# Patient Record
Sex: Female | Born: 1990 | Race: White | Hispanic: No | Marital: Married | Smoking: Never smoker
Health system: Southern US, Community
[De-identification: ages and names within clinical notes are randomized; demographics above are authoritative.]

## PROBLEM LIST (undated history)

## (undated) ENCOUNTER — Inpatient Hospital Stay (HOSPITAL_COMMUNITY): Payer: Self-pay

## (undated) ENCOUNTER — Inpatient Hospital Stay: Payer: Self-pay

## (undated) DIAGNOSIS — N189 Chronic kidney disease, unspecified: Secondary | ICD-10-CM

## (undated) DIAGNOSIS — F419 Anxiety disorder, unspecified: Secondary | ICD-10-CM

## (undated) DIAGNOSIS — N200 Calculus of kidney: Secondary | ICD-10-CM

## (undated) DIAGNOSIS — F329 Major depressive disorder, single episode, unspecified: Secondary | ICD-10-CM

## (undated) DIAGNOSIS — N390 Urinary tract infection, site not specified: Secondary | ICD-10-CM

## (undated) DIAGNOSIS — Z8659 Personal history of other mental and behavioral disorders: Secondary | ICD-10-CM

## (undated) DIAGNOSIS — F32A Depression, unspecified: Secondary | ICD-10-CM

## (undated) HISTORY — PX: OTHER SURGICAL HISTORY: SHX169

## (undated) HISTORY — PX: APPENDECTOMY: SHX54

## (undated) HISTORY — PX: COLONOSCOPY: SHX174

---
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
## 2010-08-06 ENCOUNTER — Emergency Department (HOSPITAL_COMMUNITY)
Admission: EM | Admit: 2010-08-06 | Discharge: 2010-08-06 | Disposition: A | Discharge status: Home / Self Care | Payer: BC Managed Care – PPO | Attending: Emergency Medicine | Admitting: Emergency Medicine

## 2010-08-06 DIAGNOSIS — R109 Unspecified abdominal pain: Secondary | ICD-10-CM | POA: Insufficient documentation

## 2010-08-06 DIAGNOSIS — N949 Unspecified condition associated with female genital organs and menstrual cycle: Secondary | ICD-10-CM | POA: Insufficient documentation

## 2010-08-06 DIAGNOSIS — B3731 Acute candidiasis of vulva and vagina: Secondary | ICD-10-CM | POA: Insufficient documentation

## 2010-08-06 DIAGNOSIS — N760 Acute vaginitis: Secondary | ICD-10-CM | POA: Insufficient documentation

## 2010-08-06 DIAGNOSIS — R300 Dysuria: Secondary | ICD-10-CM | POA: Insufficient documentation

## 2010-08-06 DIAGNOSIS — N390 Urinary tract infection, site not specified: Secondary | ICD-10-CM | POA: Insufficient documentation

## 2010-08-06 DIAGNOSIS — B373 Candidiasis of vulva and vagina: Secondary | ICD-10-CM | POA: Insufficient documentation

## 2010-08-06 DIAGNOSIS — F909 Attention-deficit hyperactivity disorder, unspecified type: Secondary | ICD-10-CM | POA: Insufficient documentation

## 2010-08-06 LAB — URINALYSIS, ROUTINE W REFLEX MICROSCOPIC
Nitrite: NEGATIVE
Specific Gravity, Urine: 1.019 (ref 1.005–1.030)
Urobilinogen, UA: 1 mg/dL (ref 0.0–1.0)

## 2010-08-06 LAB — URINE MICROSCOPIC-ADD ON

## 2010-08-06 LAB — WET PREP, GENITAL

## 2010-08-06 LAB — POCT PREGNANCY, URINE: Preg Test, Ur: NEGATIVE

## 2010-08-06 LAB — GC/CHLAMYDIA PROBE AMP, GENITAL
Chlamydia, DNA Probe: NEGATIVE
GC Probe Amp, Genital: NEGATIVE

## 2010-08-08 LAB — URINE CULTURE

## 2011-10-19 ENCOUNTER — Inpatient Hospital Stay: Payer: Self-pay | Admitting: Internal Medicine

## 2011-10-19 LAB — URINALYSIS, COMPLETE
Bacteria: NONE SEEN
Bilirubin,UR: NEGATIVE
Blood: NEGATIVE
Leukocyte Esterase: NEGATIVE
Ph: 6 (ref 4.5–8.0)
RBC,UR: 3 /HPF (ref 0–5)
Specific Gravity: 1.025 (ref 1.003–1.030)

## 2011-10-19 LAB — CBC WITH DIFFERENTIAL/PLATELET
Basophil #: 0.2 10*3/uL — ABNORMAL HIGH (ref 0.0–0.1)
Basophil %: 0.4 %
Eosinophil #: 0 10*3/uL (ref 0.0–0.7)
HGB: 12 g/dL (ref 12.0–16.0)
HGB: 12.9 g/dL (ref 12.0–16.0)
Lymphocyte %: 10.2 %
Lymphocyte %: 8.8 %
MCHC: 33.3 g/dL (ref 32.0–36.0)
Monocyte #: 0.9 x10 3/mm (ref 0.2–0.9)
Monocyte %: 7.9 %
Neutrophil #: 10.2 10*3/uL — ABNORMAL HIGH (ref 1.4–6.5)
Neutrophil %: 80.5 %
Neutrophil %: 82.9 %
Platelet: 227 10*3/uL (ref 150–440)
RDW: 12.9 % (ref 11.5–14.5)
WBC: 12.3 10*3/uL — ABNORMAL HIGH (ref 3.6–11.0)

## 2011-10-19 LAB — COMPREHENSIVE METABOLIC PANEL
Alkaline Phosphatase: 72 U/L (ref 50–136)
BUN: 9 mg/dL (ref 7–18)
Chloride: 102 mmol/L (ref 98–107)
Creatinine: 0.63 mg/dL (ref 0.60–1.30)
EGFR (Non-African Amer.): >60
Glucose: 98 mg/dL (ref 65–99)

## 2011-10-19 LAB — CSF CC+PROT+GLU+CULT PANEL
Glucose, CSF: 70 mg/dL (ref 40–75)
Lymphocytes: 26 %
Other Cells: 0 %
WBC (CSF): 15 /mm3

## 2011-10-19 LAB — PREGNANCY, URINE: Pregnancy Test, Urine: NEGATIVE m[IU]/mL

## 2011-10-19 IMAGING — CT CT HEAD WITHOUT CONTRAST
2 series · 16 of 30 positions shown, 20 images · non-contrast
Comparison: none

REASON FOR EXAM: headache, stiff neck and elevated WBC
COMMENTS:   LMP: Three weeks ago

[Series 2: without · axial · non-contrast · 0.45mm/px · z∈[+269,+404]mm · 13 of 33 slices shown, 17 images]
[im 3/33  brain]
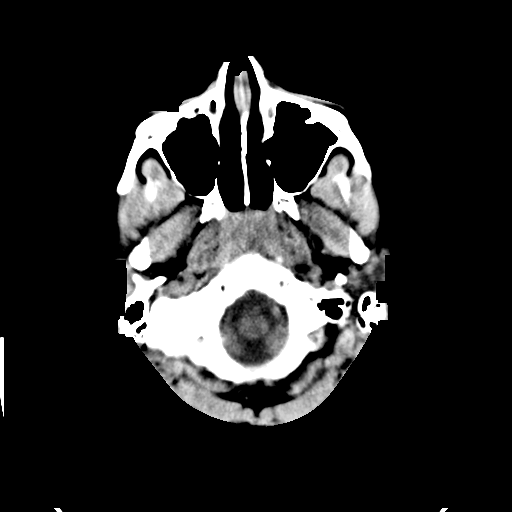
[im 3/33  bone]
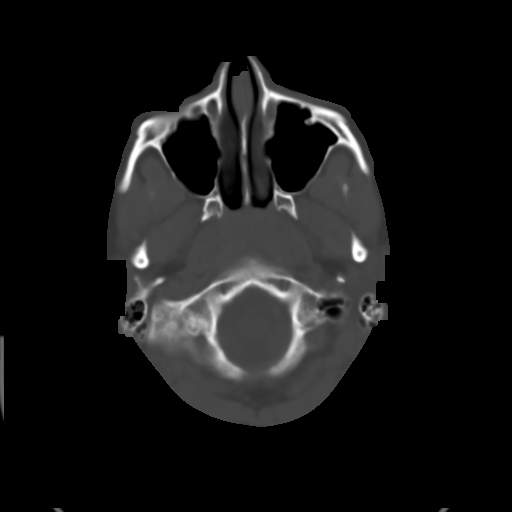
[im 5/33  brain]
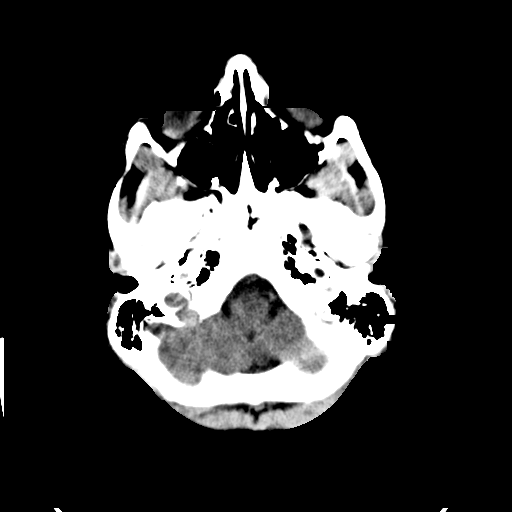
[im 7/33  brain]
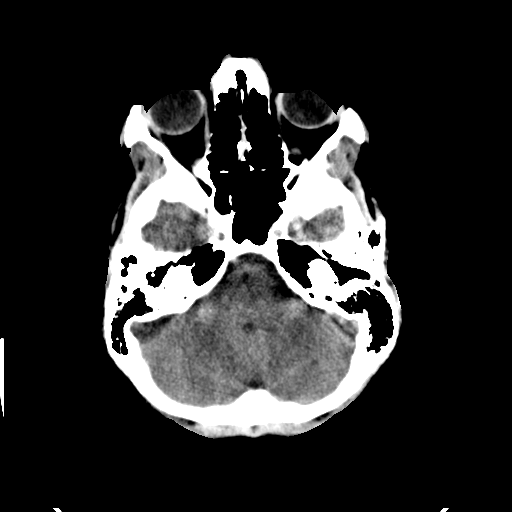
[im 10/33  brain]
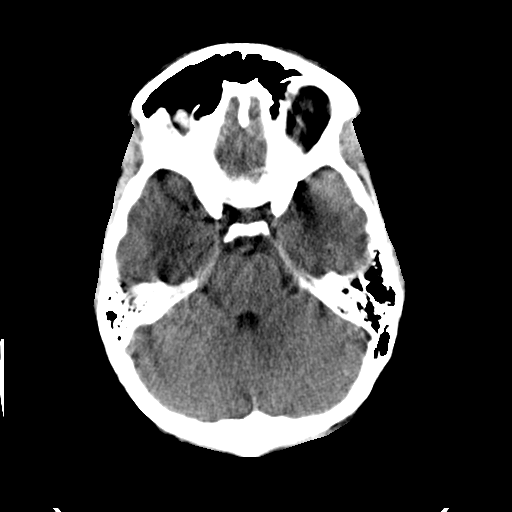
[im 12/33  brain]
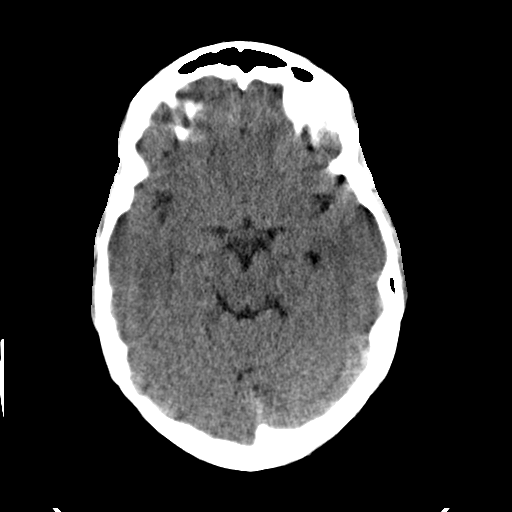
[im 12/33  bone]
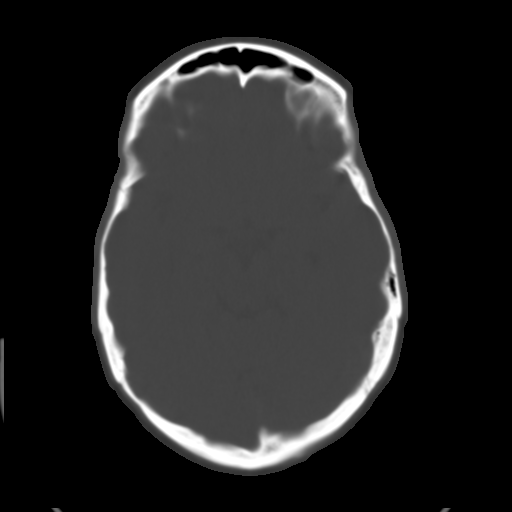
[im 14/33  brain]
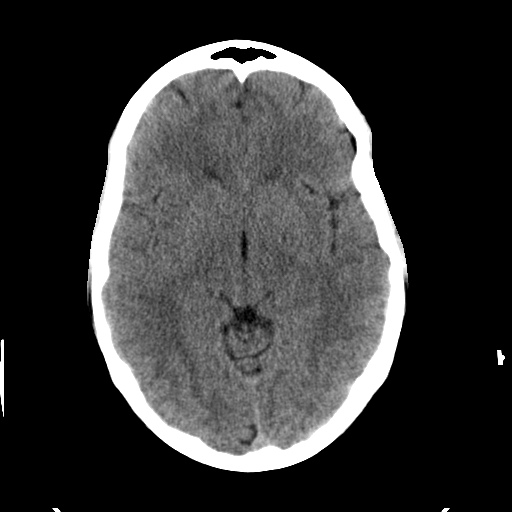
[im 17/33  brain]
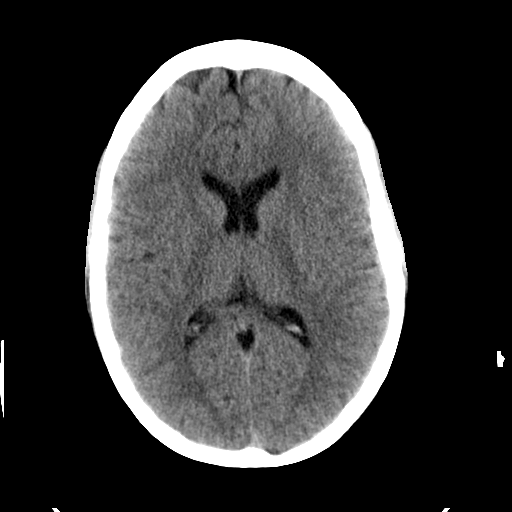
[im 19/33  brain]
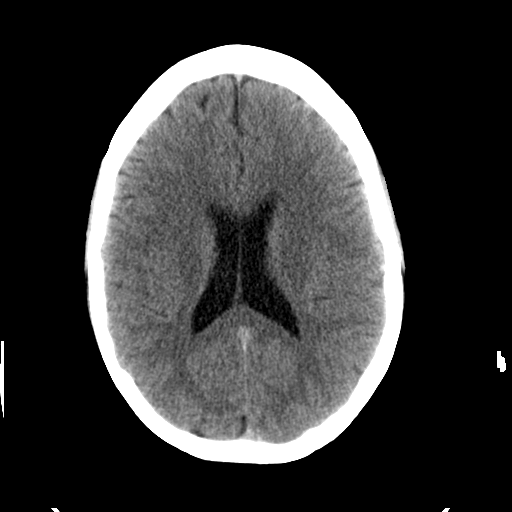
[im 21/33  brain]
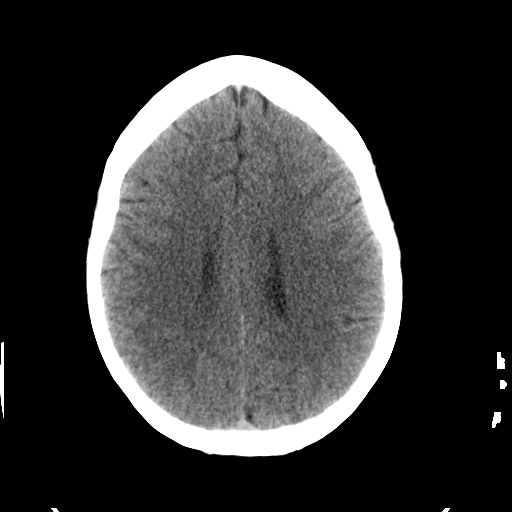
[im 21/33  bone]
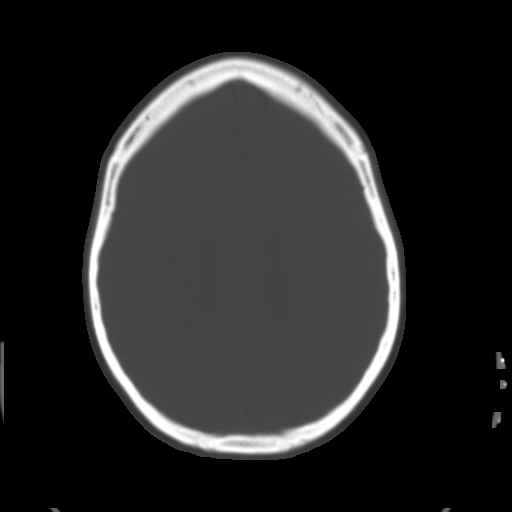
[im 23/33  brain]
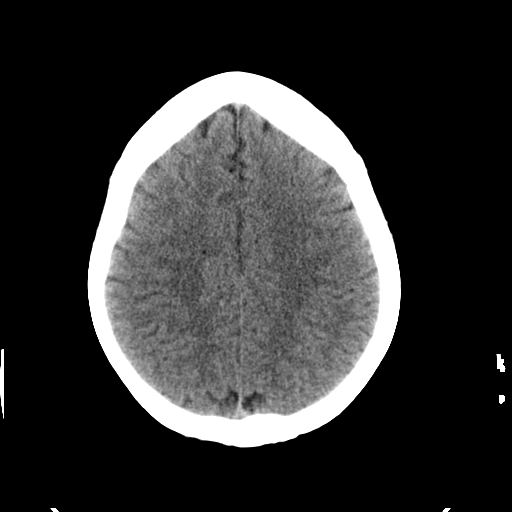
[im 26/33  brain]
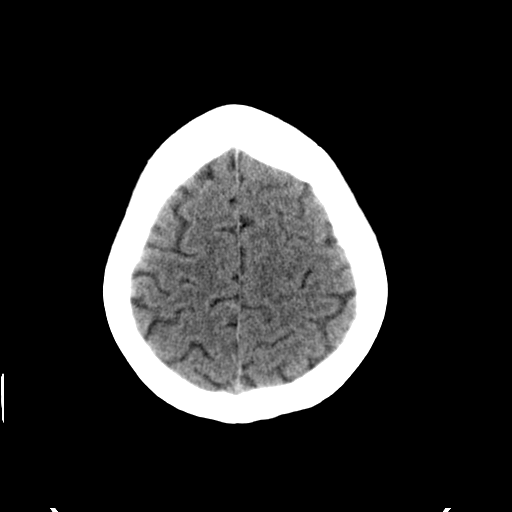
[im 28/33  brain]
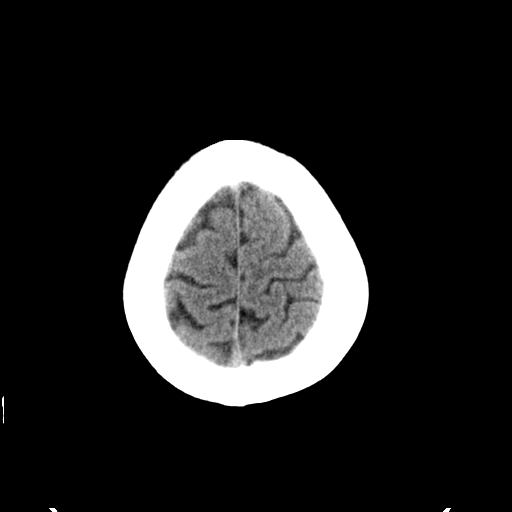
[im 30/33  brain]
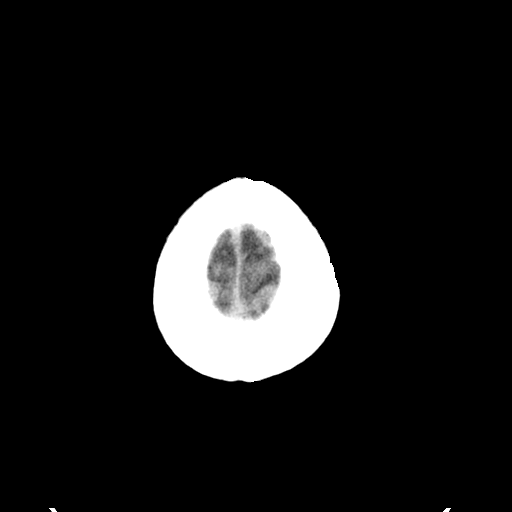
[im 30/33  bone]
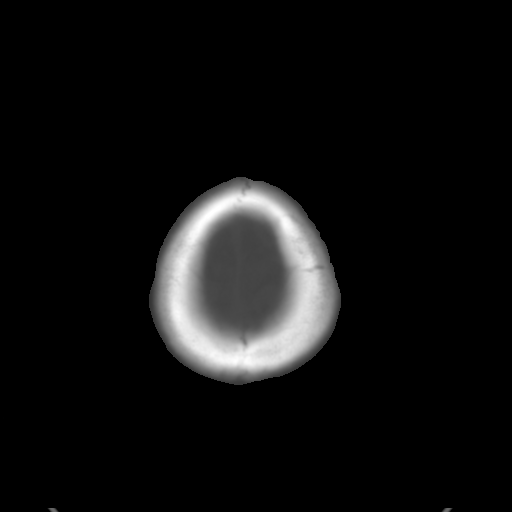

[Series 3: bone · axial · 0.45mm/px · z∈[+269,+314]mm · 3 of 33 slices shown]
[im 3/33  bone]
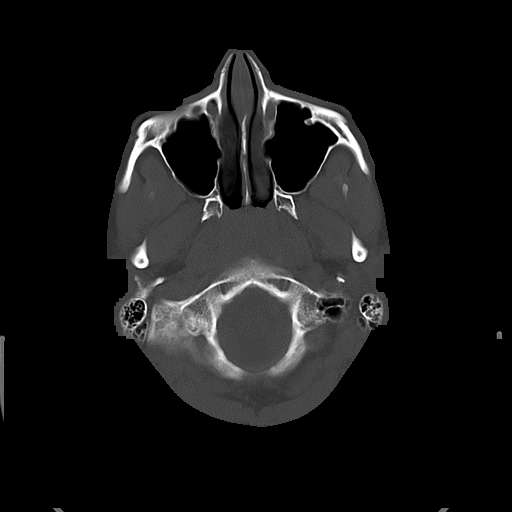
[im 7/33  bone]
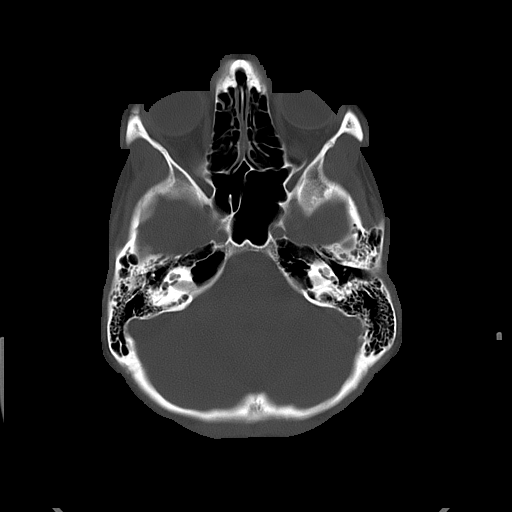
[im 12/33  bone]
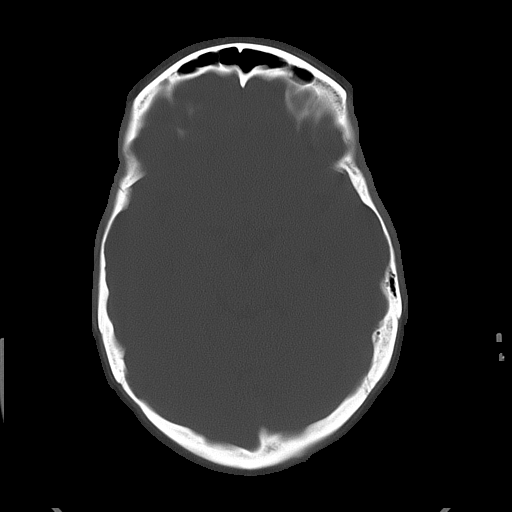

[16 of 30 positions shown; findings below may reference images not displayed]

PROCEDURE:     CT  - CT HEAD WITHOUT CONTRAST  - [DATE]  [DATE]

RESULT:     Emergent noncontrast CT of the brain is performed. There is no
previous exam for comparison.

The ventricles and sulci are normal. There is no hemorrhage. There is no
focal mass, mass-effect or midline shift. There is no evidence of edema or
territorial infarct. The bone windows demonstrate normal aeration of the
paranasal sinuses and mastoid air cells. There is no skull fracture
demonstrated.
IMPRESSION: 1. No acute intracranial abnormality.

## 2011-10-20 LAB — BASIC METABOLIC PANEL
BUN: 5 mg/dL — ABNORMAL LOW (ref 7–18)
Calcium, Total: 8.5 mg/dL (ref 8.5–10.1)
Chloride: 107 mmol/L (ref 98–107)
EGFR (African American): >60
EGFR (Non-African Amer.): >60
Glucose: 100 mg/dL — ABNORMAL HIGH (ref 65–99)
Sodium: 140 mmol/L (ref 136–145)

## 2011-10-20 LAB — CBC WITH DIFFERENTIAL/PLATELET
Basophil %: 0.4 %
Eosinophil #: 0.1 10*3/uL (ref 0.0–0.7)
Eosinophil %: 1.1 %
HCT: 35.8 % (ref 35.0–47.0)
HGB: 12.1 g/dL (ref 12.0–16.0)
Lymphocyte %: 25.4 %
MCH: 31.5 pg (ref 26.0–34.0)
MCHC: 33.7 g/dL (ref 32.0–36.0)
MCV: 94 fL (ref 80–100)
Monocyte %: 10.1 %
Neutrophil #: 4.8 10*3/uL (ref 1.4–6.5)
Platelet: 223 10*3/uL (ref 150–440)
RBC: 3.83 10*6/uL (ref 3.80–5.20)
WBC: 7.6 10*3/uL (ref 3.6–11.0)

## 2011-10-20 LAB — MAGNESIUM: Magnesium: 1.5 mg/dL — ABNORMAL LOW

## 2011-10-20 IMAGING — CR CERVICAL SPINE - 2-3 VIEW
1 series · 7 of 7 positions shown · non-contrast
Comparison: none

REASON FOR EXAM: neck pain
COMMENTS:

PROCEDURE:     DXR - DXR C- SPINE AP AND LATERAL  - [DATE] [DATE]
RESULT:      AP and lateral views of the cervical spine show the vertebral
body heights and the intervertebral disc spaces to be well maintained. The
vertebral body alignment is normal. The odontoid process is intact.

[Series 1: lat · 0.17mm/px · 7 of 7 slices shown]
[im 1/7]
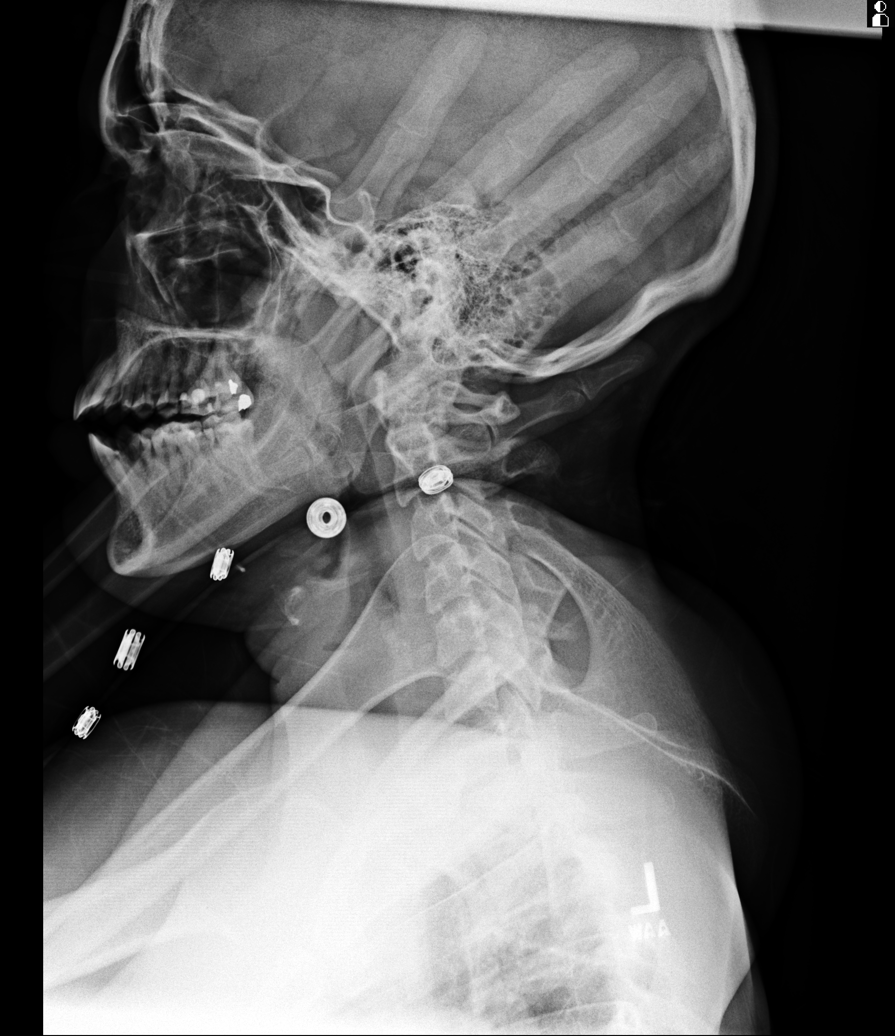
[im 2/7]
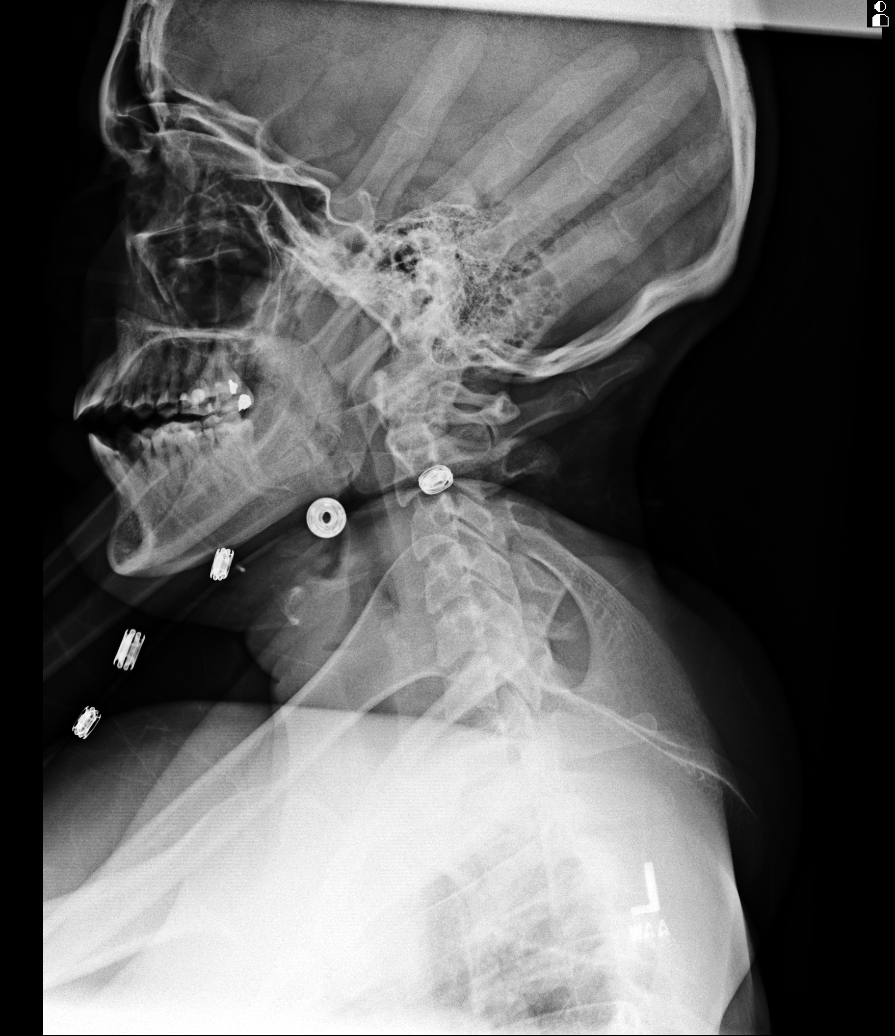
[im 3/7]
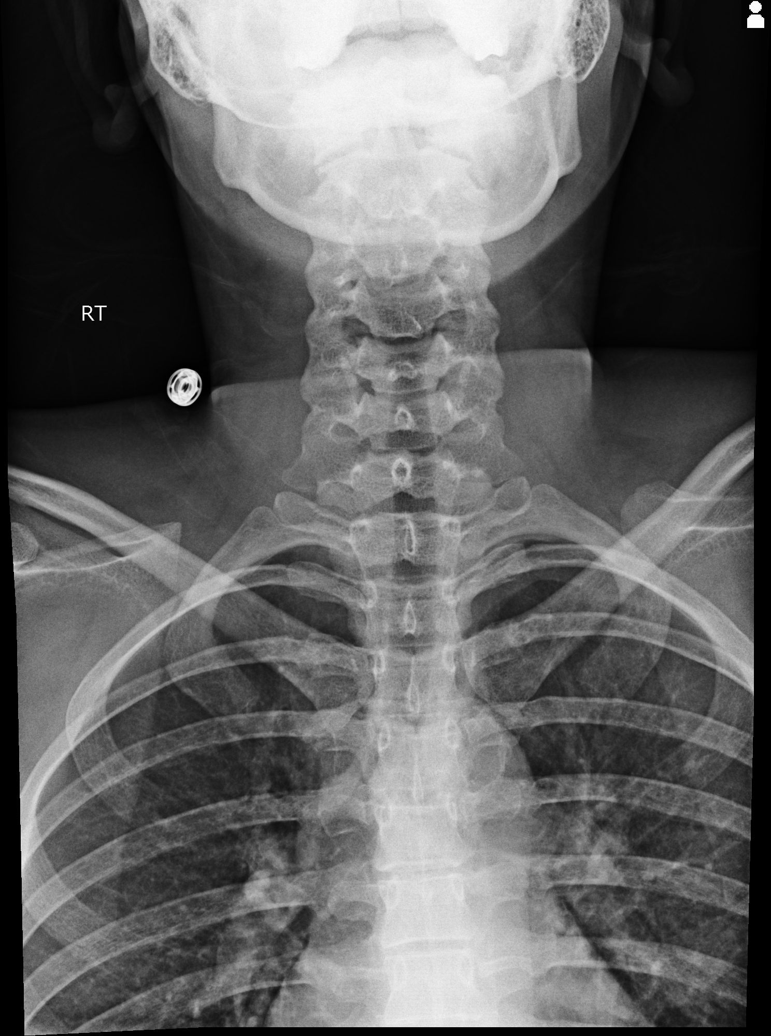
[im 4/7]
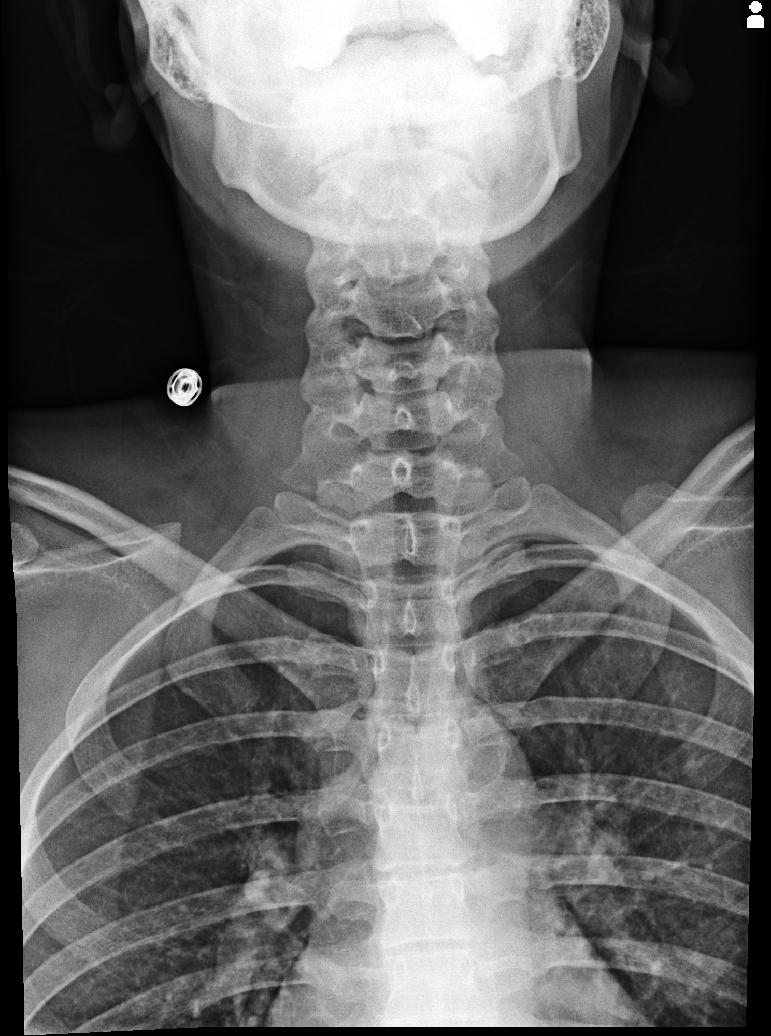
[im 5/7]
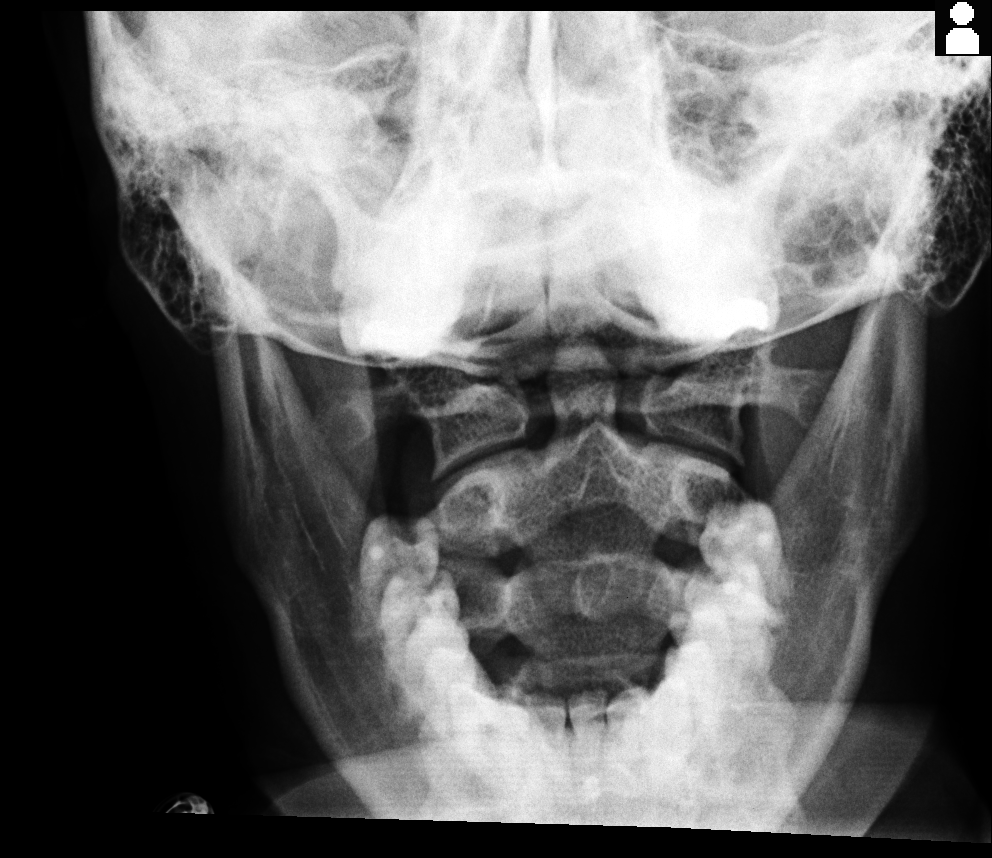
[im 6/7]
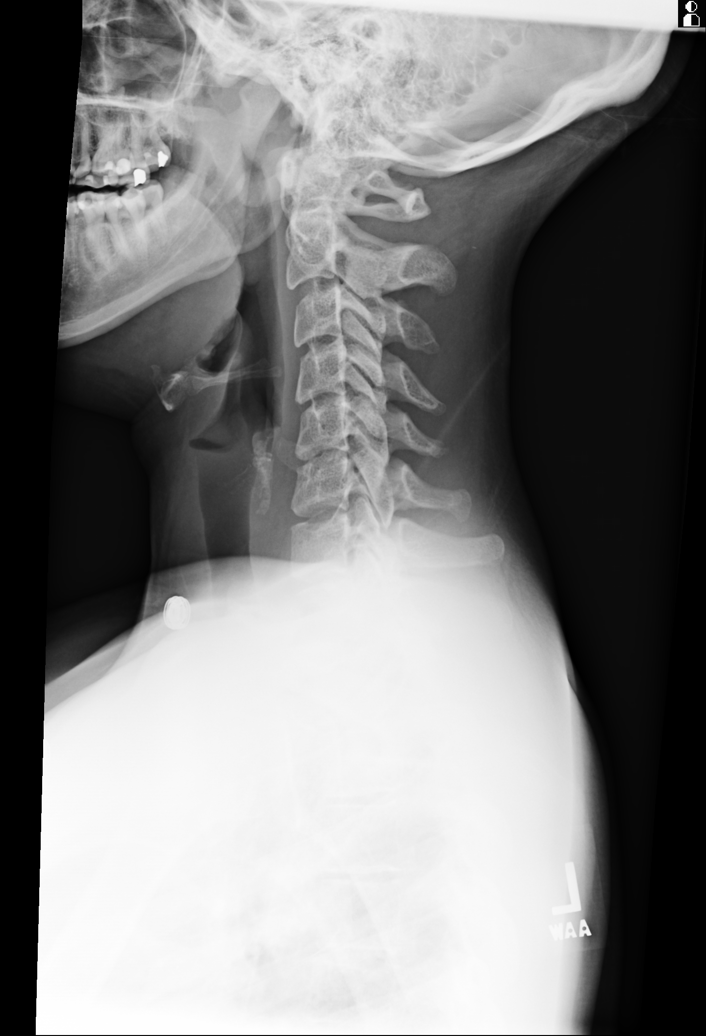
[im 7/7]
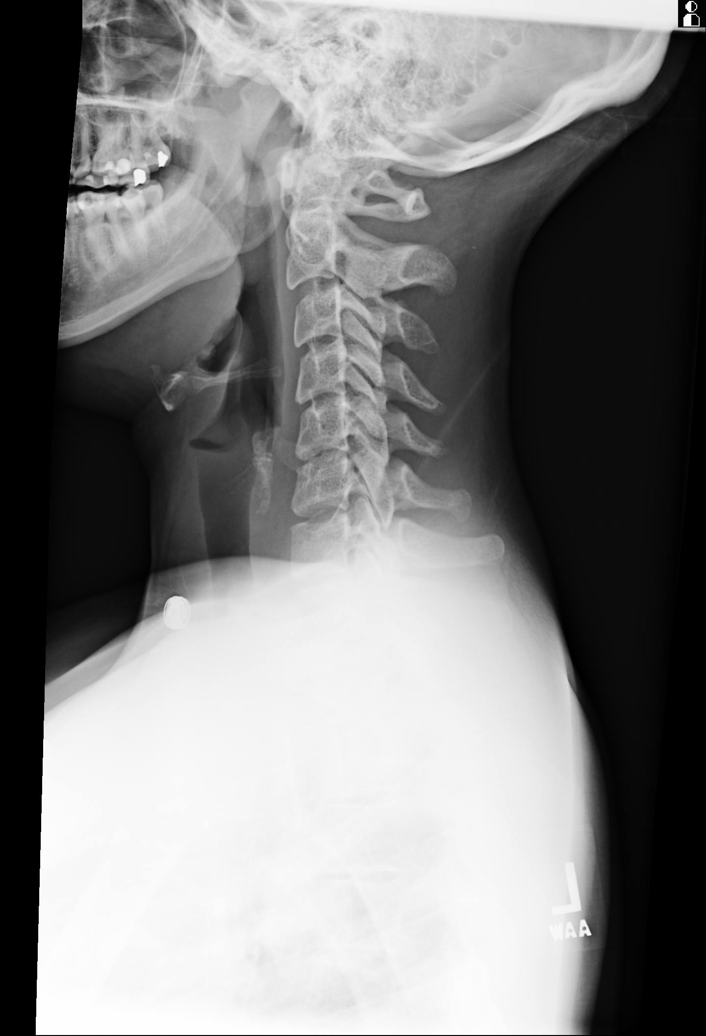

[7 of 7 positions shown; findings below may reference images not displayed]

IMPRESSION: 1.  No significant abnormalities are noted.
2.  There is reduction in the usual lordotic curvature which suggests
cervical muscle spasm.

[REDACTED]

## 2011-10-21 LAB — BETA STREP CULTURE(ARMC)

## 2011-10-25 LAB — CULTURE, BLOOD (SINGLE)

## 2012-11-26 ENCOUNTER — Emergency Department (HOSPITAL_COMMUNITY)
Admission: EM | Admit: 2012-11-26 | Discharge: 2012-11-26 | Disposition: A | Discharge status: Home / Self Care | Payer: BC Managed Care – PPO | Attending: Emergency Medicine | Admitting: Emergency Medicine

## 2012-11-26 ENCOUNTER — Encounter (HOSPITAL_COMMUNITY): Payer: Self-pay | Admitting: Emergency Medicine

## 2012-11-26 DIAGNOSIS — R21 Rash and other nonspecific skin eruption: Secondary | ICD-10-CM | POA: Insufficient documentation

## 2012-11-26 DIAGNOSIS — Z79899 Other long term (current) drug therapy: Secondary | ICD-10-CM | POA: Insufficient documentation

## 2012-11-26 NOTE — ED Provider Notes (Signed)
History    This chart was scribed for non-physician practitioner Magnus Sinning, PA working with Benny Lennert, MD by Quintella Reichert, ED Scribe. This patient was seen in room WTR9/WTR9 and the patient's care was started at 8:51 PM.   CSN: [id number]  Arrival date & time 11/26/12  1936    Chief Complaint  Patient presents with  . Rash    The history is provided by the patient. No language interpreter was used.    HPI Comments: Tiffany Whitehead is a 22 y.o. female who presents to the Emergency Department complaining of a progressively-worsening rash to her right and left buttock that she noticed 1.5 hours ago.  Pt localizes rash to the area that would be covered by a swimsuit.  Rash is described as red, itchy and burning.  She also complains of feeling flush, though she attributes this to anxiety.  She denies objective fever, SOB, facial swelling, trouble swallowing or any other associated symptoms.  She denies recent changes to cosmetic or hygiene products or changes to bathing suit or underwear.  She does note that she has spent time in the sun and has been wearing a swimsuit recently.  She denies previous h/o similar symptoms.      History reviewed. No pertinent past medical history.   History reviewed. No pertinent past surgical history.   No family history on file.   History  Substance Use Topics  . Smoking status: Never Smoker   . Smokeless tobacco: Not on file  . Alcohol Use: No    OB History   Grav Para Term Preterm Abortions TAB SAB Ect Mult Living                   Review of Systems  Constitutional: Negative for fever.  HENT: Negative for facial swelling and trouble swallowing.   Respiratory: Negative for shortness of breath.   Skin: Positive for rash.  All other systems reviewed and are negative.      Allergies  Ceclor  Home Medications   Current Outpatient Rx  Name  Route  Sig  Dispense  Refill  . clonazePAM (KLONOPIN) 0.5 MG tablet    Oral   Take 0.25 mg by mouth daily as needed for anxiety.         Marland Kitchen ibuprofen (ADVIL,MOTRIN) 200 MG tablet   Oral   Take 800 mg by mouth every 6 (six) hours as needed for pain.         Marland Kitchen lisdexamfetamine (VYVANSE) 30 MG capsule   Oral   Take 30 mg by mouth every morning.           BP 109/78  Pulse 70  Temp(Src) 97.4 F (36.3 C) (Oral)  Wt 125 lb (56.7 kg)  SpO2 100%  LMP 11/26/2012  Physical Exam  Nursing note and vitals reviewed. Constitutional: She appears well-developed and well-nourished. No distress.  HENT:  Head: Normocephalic and atraumatic.  Eyes: Conjunctivae are normal.  Neck: Neck supple.  Cardiovascular: Normal rate, regular rhythm and normal heart sounds.   No murmur heard. Pulmonary/Chest: Effort normal and breath sounds normal. No respiratory distress. She has no wheezes. She has no rales.  Neurological: She is alert.  Skin: Skin is warm and dry. Rash noted.     Erythema of both sides of the buttocks which is in a linear pattern and follows the lines of where a swimsuit would sit.  No papules present.  No induration or fluctuance.  No drainage.  Psychiatric:  She has a normal mood and affect. Her behavior is normal.    ED Course  Procedures (including critical care time)  DIAGNOSTIC STUDIES: Oxygen Saturation is 100% on room air, normal by my interpretation.   Marland Kitchen  COORDINATION OF CARE: 9:01 PM: Informed pt that symptoms are likely sunburn or a localized allergic reaction to clothing. Discussed treatment plan which includes symptomatic relief with Benadryl and hydrocortisone cream.  Pt expressed understanding and agreed to plan.    Labs Reviewed - No data to display  No results found.  No diagnosis found.  MDM  Appearance of the rash most consistent with a sunburn.  No papules present.  Erythema follows a line of where a swim suit would sit.  It is possible that she could also be having an allergic reaction to the elastic or something in her  swim suit.  However, I feel that sunburn is most likely.  I personally performed the services described in this documentation, which was scribed in my presence. The recorded information has been reviewed and is accurate.     Pascal Lux [place], PA-C 11/27/12 0028

## 2012-11-26 NOTE — ED Notes (Signed)
Patient reports that she had a rash appear about 1 - 1/2 hours ago to her right buttocks

## 2012-11-28 NOTE — ED Provider Notes (Signed)
Medical screening examination/treatment/procedure(s) were performed by non-physician practitioner and as supervising physician I was immediately available for consultation/collaboration.   Aemilia Dedrick L Shea Swalley, MD 11/28/12 1538 

## 2013-03-20 ENCOUNTER — Emergency Department (HOSPITAL_COMMUNITY)
Admission: EM | Admit: 2013-03-20 | Discharge: 2013-03-20 | Disposition: A | Discharge status: Home / Self Care | Payer: BC Managed Care – PPO | Attending: Emergency Medicine | Admitting: Emergency Medicine

## 2013-03-20 ENCOUNTER — Encounter (HOSPITAL_COMMUNITY): Payer: Self-pay | Admitting: Emergency Medicine

## 2013-03-20 DIAGNOSIS — F419 Anxiety disorder, unspecified: Secondary | ICD-10-CM

## 2013-03-20 DIAGNOSIS — R350 Frequency of micturition: Secondary | ICD-10-CM | POA: Insufficient documentation

## 2013-03-20 DIAGNOSIS — S7010XA Contusion of unspecified thigh, initial encounter: Secondary | ICD-10-CM | POA: Insufficient documentation

## 2013-03-20 DIAGNOSIS — R3915 Urgency of urination: Secondary | ICD-10-CM | POA: Insufficient documentation

## 2013-03-20 DIAGNOSIS — S0990XA Unspecified injury of head, initial encounter: Secondary | ICD-10-CM | POA: Insufficient documentation

## 2013-03-20 DIAGNOSIS — R300 Dysuria: Secondary | ICD-10-CM | POA: Insufficient documentation

## 2013-03-20 DIAGNOSIS — G479 Sleep disorder, unspecified: Secondary | ICD-10-CM | POA: Insufficient documentation

## 2013-03-20 DIAGNOSIS — IMO0002 Reserved for concepts with insufficient information to code with codable children: Secondary | ICD-10-CM | POA: Insufficient documentation

## 2013-03-20 DIAGNOSIS — Z79899 Other long term (current) drug therapy: Secondary | ICD-10-CM | POA: Insufficient documentation

## 2013-03-20 DIAGNOSIS — Z3202 Encounter for pregnancy test, result negative: Secondary | ICD-10-CM | POA: Insufficient documentation

## 2013-03-20 DIAGNOSIS — S0003XA Contusion of scalp, initial encounter: Secondary | ICD-10-CM | POA: Insufficient documentation

## 2013-03-20 DIAGNOSIS — F411 Generalized anxiety disorder: Secondary | ICD-10-CM | POA: Insufficient documentation

## 2013-03-20 LAB — URINALYSIS W MICROSCOPIC + REFLEX CULTURE
Glucose, UA: NEGATIVE mg/dL
Leukocytes, UA: NEGATIVE
Protein, ur: NEGATIVE mg/dL
pH: 6 (ref 5.0–8.0)

## 2013-03-20 LAB — POCT PREGNANCY, URINE: Preg Test, Ur: NEGATIVE

## 2013-03-20 MED ORDER — LORAZEPAM 1 MG PO TABS
1.0000 mg | ORAL_TABLET | Freq: Once | ORAL | Status: AC
  Administered 2013-03-20: 1 mg via ORAL
  Filled 2013-03-20: qty 1

## 2013-03-20 MED ORDER — LORAZEPAM 1 MG PO TABS: 1.0000 mg | ORAL_TABLET | Freq: Three times a day (TID) | ORAL | Status: DC | PRN
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
## 2013-03-21 NOTE — ED Provider Notes (Signed)
Medical screening examination/treatment/procedure(s) were performed by non-physician practitioner and as supervising physician I was immediately available for consultation/collaboration.  Mitsugi Schrader T Kenslie Abbruzzese, MD 03/21/13 2324 

## 2013-05-13 ENCOUNTER — Encounter (HOSPITAL_COMMUNITY): Payer: Self-pay | Admitting: Emergency Medicine

## 2013-05-13 ENCOUNTER — Emergency Department (HOSPITAL_COMMUNITY)
Admission: EM | Admit: 2013-05-13 | Discharge: 2013-05-13 | Discharge status: Against Medical Advice | Payer: BC Managed Care – PPO | Attending: Emergency Medicine | Admitting: Emergency Medicine

## 2013-05-13 DIAGNOSIS — Z79899 Other long term (current) drug therapy: Secondary | ICD-10-CM | POA: Insufficient documentation

## 2013-05-13 DIAGNOSIS — Y9229 Other specified public building as the place of occurrence of the external cause: Secondary | ICD-10-CM | POA: Insufficient documentation

## 2013-05-13 DIAGNOSIS — Y939 Activity, unspecified: Secondary | ICD-10-CM | POA: Insufficient documentation

## 2013-05-13 DIAGNOSIS — R112 Nausea with vomiting, unspecified: Secondary | ICD-10-CM | POA: Insufficient documentation

## 2013-05-13 DIAGNOSIS — R109 Unspecified abdominal pain: Secondary | ICD-10-CM | POA: Insufficient documentation

## 2013-05-13 DIAGNOSIS — S3981XA Other specified injuries of abdomen, initial encounter: Secondary | ICD-10-CM | POA: Insufficient documentation

## 2013-05-13 DIAGNOSIS — W64XXXA Exposure to other animate mechanical forces, initial encounter: Secondary | ICD-10-CM | POA: Insufficient documentation

## 2013-05-13 DIAGNOSIS — R300 Dysuria: Secondary | ICD-10-CM | POA: Insufficient documentation

## 2013-05-13 LAB — CBC WITH DIFFERENTIAL/PLATELET
Basophils Relative: 0 % (ref 0–1)
Eosinophils Absolute: 0.1 10*3/uL (ref 0.0–0.7)
Eosinophils Relative: 1 % (ref 0–5)
Hemoglobin: 14.5 g/dL (ref 12.0–15.0)
MCH: 31.7 pg (ref 26.0–34.0)
MCHC: 33.7 g/dL (ref 30.0–36.0)
Monocytes Absolute: 1.1 10*3/uL — ABNORMAL HIGH (ref 0.1–1.0)
Monocytes Relative: 9 % (ref 3–12)
Neutrophils Relative %: 74 % (ref 43–77)

## 2013-05-13 LAB — COMPREHENSIVE METABOLIC PANEL
ALT: 16 U/L (ref 0–35)
AST: 25 U/L (ref 0–37)
Alkaline Phosphatase: 58 U/L (ref 39–117)
GFR calc Af Amer: >90 mL/min (ref >90)
GFR calc non Af Amer: >90 mL/min (ref >90)
Glucose, Bld: 97 mg/dL (ref 70–99)
Potassium: 4 mEq/L (ref 3.5–5.1)
Total Bilirubin: 0.7 mg/dL (ref 0.3–1.2)

## 2013-05-13 NOTE — ED Notes (Signed)
No answer x3
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
## 2013-05-13 NOTE — ED Notes (Signed)
Pt previously triaged at [facility] with blood work done; left due to long wait time

## 2013-05-13 NOTE — ED Notes (Signed)
Pt 100 lb dog ran and hit her in the stomach yesterday; she was knocked to the ground and wind knocked out of her; states continues with abd pain; can't sit or stand without help; feels like entire core is sore; vomited immediately after; states since then hasn't been able to eat; nauseated; states doesn't feel like she is swallowing or breathing properly; speaking in full sentences in triage

## 2013-05-14 ENCOUNTER — Emergency Department (HOSPITAL_COMMUNITY)
Admission: EM | Admit: 2013-05-14 | Discharge: 2013-05-14 | Discharge status: Against Medical Advice | Payer: BC Managed Care – PPO | Attending: Emergency Medicine | Admitting: Emergency Medicine

## 2013-05-14 NOTE — ED Notes (Signed)
Not in waiting area x 1 

## 2013-05-14 NOTE — ED Notes (Signed)
Pt to triage desk asking if she needed to stay; advised pt she needed to stay to be seen; pt not in waiting area when name called

## 2014-08-30 ENCOUNTER — Emergency Department (HOSPITAL_COMMUNITY)
Admission: EM | Admit: 2014-08-30 | Discharge: 2014-08-30 | Discharge status: Against Medical Advice | Payer: BLUE CROSS/BLUE SHIELD | Attending: Emergency Medicine | Admitting: Emergency Medicine

## 2014-08-30 ENCOUNTER — Encounter (HOSPITAL_COMMUNITY): Payer: Self-pay | Admitting: Emergency Medicine

## 2014-08-30 DIAGNOSIS — Y9389 Activity, other specified: Secondary | ICD-10-CM | POA: Diagnosis not present

## 2014-08-30 DIAGNOSIS — Y998 Other external cause status: Secondary | ICD-10-CM | POA: Diagnosis not present

## 2014-08-30 DIAGNOSIS — Y288XXA Contact with other sharp object, undetermined intent, initial encounter: Secondary | ICD-10-CM | POA: Insufficient documentation

## 2014-08-30 DIAGNOSIS — S61214A Laceration without foreign body of right ring finger without damage to nail, initial encounter: Secondary | ICD-10-CM | POA: Diagnosis not present

## 2014-08-30 DIAGNOSIS — Y9289 Other specified places as the place of occurrence of the external cause: Secondary | ICD-10-CM | POA: Insufficient documentation

## 2014-08-30 NOTE — ED Notes (Signed)
Pt stated that she was leaving before she was seen. Bleeding in small laceration in tip of finger had slowed.

## 2014-08-30 NOTE — ED Notes (Signed)
Pt was cutting a bagel at appx 0930 this morning and accidentally cut the end of her ring finger on her left hand.  [facility] and they "could not advise her whether she needed stitches or not but that she needed to come to the closest ER." Bleeding is controlled.  Very, very small laceration noted to finger.

## 2014-09-09 NOTE — Discharge Summary (Signed)
PATIENT NAME:  [name], [name] MR#:  [id number] DATE OF BIRTH:  Feb 05, 1991  DATE OF ADMISSION:  10/19/2011 DATE OF DISCHARGE:  10/21/2011  DISCHARGE DIAGNOSES: 1. Strep sore throat causing cervical muscle spasms and fever.  2. Low back pain after lumbar puncture. 3. Positional vertigo.   DISCHARGE MEDICATION: Continue Adderall XR 20 mg extended release once a day in the morning that she takes at home.   NEW MEDICATIONS:  1. Ibuprofen 400 mg every six hours p.r.n. for back pain.  2. Skelaxin 800 mg p.o. t.i.d.  3. Percocet 5/325 q.6 hours p.r.n. for pain.  4. Sterapred Dosepak 10 mg p.o. daily, take as directed.  5. Levaquin 500 mg p.o. daily for four days.  6. Meclizine 12.5 mg p.o. t.i.d. p.r.n. for dizziness.   CONSULTATIONS: None.  LABORATORY, DIAGNOSTIC AND RADIOLOGICAL DATA: Infectious mononucleosis test negative. WBC on admission elevated at 15.3, hemoglobin 12.9, hematocrit 38.5, platelets 262. Electrolytes: Sodium 140, potassium 3.4, chloride 102, bicarbonate 30, BUN 9, creatinine 0.63, glucose 94.   Urine is clear. Patient has no bacteria. Pregnancy test negative. Head CT showed no acute intracranial abnormality. CSF cultures did not show any growth. Patient's blood cultures have been negative. Throat culture showed strep beta-hemolytic streptococci.   WBC normalized on 06/04 at 7.6, hemoglobin 12.1, hematocrit 35.8, platelets 223. Patient also had hypomagnesemia 1.5 on 06/04 which is  being replaced.   Cervical spine x-ray showed no significant abnormality. Patient has cervical muscle spasm.   DISCHARGE VITALS: Temperature 97.8, pulse 67, respirations 18, blood pressure 105/67, sats 97% on room air.   HOSPITAL COURSE: 24 year old female with past medical history of depression came in because of fever and also headache, neck pain. Patient also had very stiff neck. Because of that she had a lumbar puncture in the ER. Essentially the cultures were negative. Empirically she was  given a dose of vancomycin and was admitted to hospitalist service for possible viral meningitis. She was initially placed in isolation but her cultures have been negative. Isolation discontinued. She also received IV fluids. Patient complained of sore throat, very difficult to swallow so sore throat cultures were obtained. Positive for strep throat, started on Levaquin. Patient did feel better. White count is normal. Fever decreased. Patient continued to have neck pain for which x-ray of the neck obtained showed muscle spasm. Patient started on Skelaxin, Motrin and also morphine. Patient's neck pain is better. Her range of motion is better. She had low back pain at the lumbar puncture site. Because of low back pain her mobility is decreased. She has no other neurological symptoms, afebrile. Her neurological exam within normal limits. Patient started on prednisone to help her with back pain also and she will be discharged with Sterapred Dosepak to help her with acute lumbosacral strain. Patient is advised to do walking slowly as tolerated and not to lift heavy weight. For strep throat she will be getting another four days of Levaquin and for neck pain/back pain she can continue Skelaxin, Motrin and Percocet and she can use the [facility] Healthcare[facility]terapred Dosepak as well. Patient has no primary doctor. The mother is getting In touch with Dr. Lisabeth PickAmy Bledsoe from [facility] and establish as PMD. Patient right now is better, walking with some help. Has some positional vertigo. Told her she can use meclizine. We are going to check her orthostatic vitals as well but she already received a lot of fluids and she has to do slow activities and getting up from bed take is slowly as  tolerated. Patient also had low potassium with potassium of 3.4 on admission, which was being replaced.   CONDITION ON DISCHARGE: Stable at this time.   TOTAL TIME SPENT: Total time spent on discharge preparation and talking to family took more than 30 minutes.    ____________________________ Katha Hamming, MD sk:cms D: 10/21/2011 12:27:26 ET T: 10/21/2011 15:13:31 ET JOB#: [id number]  cc: Katha Hamming, MD, <Dictator> Katha Hamming MD ELECTRONICALLY SIGNED 10/28/2011 6:51

## 2014-09-09 NOTE — H&P (Signed)
PATIENT NAME:  Tiffany Whitehead, Tiffany Whitehead MR#:  [id number] DATE OF BIRTH:  October 21, 1990  DATE OF ADMISSION:  10/19/2011  PRIMARY CARE PHYSICIAN: Cassell Clement, MD  REFERRING PHYSICIAN: Pamala Duffel, MD  CHIEF COMPLAINT: Headache, neck pain nonradiating, and nausea and vomiting for one day.  HISTORY OF PRESENT ILLNESS: This is a 24 year old Caucasian female with a history of depression who presented to the ED with the above chief complaint. The patient is alert, awake, and oriented. She said she had a fever two days ago and started to have a headache, neck pain, and rigidity yesterday which is getting worse. In addition, the patient had nausea and vomiting, photophobia, sore throat, and generalized body muscle pain, but she denies any ill contact, no rash, no STDs. The patient said she went to [place] in February, but denies any tick bite. The patient head CT that is negative. She got lumbar puncture. CSF showed WBC 15. She was treated with empiric vancomycin in the ED.   PAST MEDICAL HISTORY: Depression.   SOCIAL HISTORY: No smoking or drinking or illicit drugs.   FAMILY HISTORY: Mother had colon cancer. Cousin has diabetes.   PAST SURGICAL HISTORY: None.   MEDICATIONS: Adderall XL 20 mg p.o. daily.   DRUG ALLERGIES: Ceclor.   REVIEW OF SYSTEMS:  CONSTITUTIONAL: The patient has fever, weakness, headache, neck pain, and sore throat. EYES: No double vision or blurred vision, but has photophobia. ENT: No hearing loss, postnasal drip, or epistaxis, but has sore throat. No slurred speech or dysphagia. RESPIRATORY: No cough, sputum, shortness of breath, or hematemesis. CARDIOVASCULAR: No chest pain, palpitations, or orthopnea. No nocturnal dyspnea. GASTROINTESTINAL: No abdominal pain, but has nausea and vomiting. No diarrhea, melena, or bloody stool. GU: No dysuria, hematuria, or incontinence. ENDOCRINE: No polyuria or polydipsia. HEMATOLOGY: No easy bruising or bleeding or swollen glands. NEUROLOGY: No  syncope, loss of consciousness or seizure, but has mild confusion. SKIN: No rash or jaundice.   PHYSICAL EXAMINATION:   VITALS: Temperature 98.4, blood pressure 139/82, pulse 95, respirations 18, oxygen saturation 97% on room air.  GENERAL: The patient is alert, awake, and oriented, in no acute distress.   HEENT: Pupils are round and equal and reactive to light and accommodation. Moist oral mucosa. Clear oropharynx.   NECK: Supple. Has rigidity and tenderness. No JVD. No carotid bruit. No lymphadenopathy. No thyromegaly.   CARDIOVASCULAR: S1 and S2 regular rate and rhythm. No murmurs or gallops.   PULMONARY: Bilateral air entry. No wheezing or rales.   ABDOMEN: Soft. No distention. Has some mild tenderness in the epigastric area. Bowel sounds present. No organomegaly.   EXTREMITIES: No edema, clubbing, or cyanosis. No calf tenderness. Strong pedal bilateral pulses.  NEUROLOGIC: Alert and oriented x3. No focal deficit. Power 5/5. Sensation intact. Deep tendon reflexes 2+. Kernig sign and Brudzinski signs are positive.  LABS/STUDIES: CSF showed RBC 1513, WBC 15, neutrophils 58, lymphocytes 26, monocytes 16, protein 31, glucose 70. Gram stain no organisms seen.  Pregnancy test negative.  Urinalysis negative.   WBC 16.3, hemoglobin 12.9, and platelets 93. Glucose 98, BUN 9, creatinine 0.63, sodium 140, potassium 3.4, chloride 102, and bicarbonate 30. Mono test negative.  IMPRESSION: 1. Acute meningitis, possibly viral meningitis.  2. Leukocytosis.  3. Hypokalemia.   PLAN OF TREATMENT: The patient will be admitted to the medical floor. We will start IV fluid support, Zofran, and morphine p.r.n. for nausea, vomiting and headache. Follow-up CSF culture and CBC. We will give potassium and follow-up BMP and magnesium  level. GI and deep vein thrombosis prophylaxis.  I discussed the patient's situation and plan of treatment with the patient.  TIME SPENT: About 60  minutes. ____________________________ Shaune PollackQing Shanayah Kaffenberger, MD qc:slb D: 10/19/2011 05:39:23 ET T: 10/19/2011 08:44:03 ET JOB#: [id number]  cc: Shaune PollackQing Lorie Cleckley, MD, <Dictator> Beryle QuantLynde G. Toya SmothersKnowles-Jonas, MD Shaune PollackQING Jaequan Propes MD ELECTRONICALLY SIGNED 10/20/2011 2:41

## 2015-04-01 ENCOUNTER — Encounter (HOSPITAL_COMMUNITY): Payer: Self-pay | Admitting: Emergency Medicine

## 2015-04-01 ENCOUNTER — Emergency Department (HOSPITAL_COMMUNITY)
Admission: EM | Admit: 2015-04-01 | Discharge: 2015-04-02 | Disposition: A | Discharge status: Home / Self Care | Payer: BLUE CROSS/BLUE SHIELD | Attending: Emergency Medicine | Admitting: Emergency Medicine

## 2015-04-01 DIAGNOSIS — R1031 Right lower quadrant pain: Secondary | ICD-10-CM | POA: Insufficient documentation

## 2015-04-01 DIAGNOSIS — Z79818 Long term (current) use of other agents affecting estrogen receptors and estrogen levels: Secondary | ICD-10-CM | POA: Insufficient documentation

## 2015-04-01 DIAGNOSIS — K921 Melena: Secondary | ICD-10-CM | POA: Diagnosis not present

## 2015-04-01 DIAGNOSIS — R197 Diarrhea, unspecified: Secondary | ICD-10-CM | POA: Insufficient documentation

## 2015-04-01 DIAGNOSIS — R42 Dizziness and giddiness: Secondary | ICD-10-CM | POA: Diagnosis not present

## 2015-04-01 DIAGNOSIS — R5383 Other fatigue: Secondary | ICD-10-CM | POA: Insufficient documentation

## 2015-04-01 DIAGNOSIS — Z3202 Encounter for pregnancy test, result negative: Secondary | ICD-10-CM | POA: Insufficient documentation

## 2015-04-01 DIAGNOSIS — R110 Nausea: Secondary | ICD-10-CM | POA: Insufficient documentation

## 2015-04-01 DIAGNOSIS — Z79899 Other long term (current) drug therapy: Secondary | ICD-10-CM | POA: Insufficient documentation

## 2015-04-01 DIAGNOSIS — R1033 Periumbilical pain: Secondary | ICD-10-CM | POA: Diagnosis present

## 2015-04-01 NOTE — ED Notes (Signed)
Pt states this evening around 6 she started having abd pain behind her belly button  Pt states she has had several bowel movements today and has had old blood noted in them  Pt states the blood is really dark and black  Pt sates she has had nausea and made herself vomit but it did not help the pain  Pt states she has not eaten much at all today and has taken 3 extra strength advil  Rates her pain 7/10 at this time

## 2015-04-02 ENCOUNTER — Encounter (HOSPITAL_COMMUNITY): Payer: Self-pay

## 2015-04-02 ENCOUNTER — Emergency Department (HOSPITAL_COMMUNITY): Payer: BLUE CROSS/BLUE SHIELD

## 2015-04-02 LAB — LIPASE, BLOOD: Lipase: 47 U/L (ref 11–51)

## 2015-04-02 LAB — COMPREHENSIVE METABOLIC PANEL
ALK PHOS: 38 U/L (ref 38–126)
ALT: 15 U/L (ref 14–54)
ANION GAP: 8 (ref 5–15)
AST: 20 U/L (ref 15–41)
Albumin: 4.6 g/dL (ref 3.5–5.0)
BUN: 7 mg/dL (ref 6–20)
CALCIUM: 9.8 mg/dL (ref 8.9–10.3)
CO2: 25 mmol/L (ref 22–32)
CREATININE: 0.75 mg/dL (ref 0.44–1.00)
Chloride: 104 mmol/L (ref 101–111)
Glucose, Bld: 106 mg/dL — ABNORMAL HIGH (ref 65–99)
Potassium: 3.9 mmol/L (ref 3.5–5.1)
Sodium: 137 mmol/L (ref 135–145)
TOTAL PROTEIN: 8.1 g/dL (ref 6.5–8.1)
Total Bilirubin: 0.7 mg/dL (ref 0.3–1.2)

## 2015-04-02 LAB — URINALYSIS, ROUTINE W REFLEX MICROSCOPIC
BILIRUBIN URINE: NEGATIVE
Glucose, UA: NEGATIVE mg/dL
HGB URINE DIPSTICK: NEGATIVE
KETONES UR: 15 mg/dL — AB
Leukocytes, UA: NEGATIVE
NITRITE: NEGATIVE
PROTEIN: NEGATIVE mg/dL
Specific Gravity, Urine: 1.006 (ref 1.005–1.030)
UROBILINOGEN UA: 0.2 mg/dL (ref 0.0–1.0)
pH: 6.5 (ref 5.0–8.0)

## 2015-04-02 LAB — CBC
HEMATOCRIT: 40.7 % (ref 36.0–46.0)
HEMOGLOBIN: 13.4 g/dL (ref 12.0–15.0)
MCH: 30.4 pg (ref 26.0–34.0)
MCHC: 32.9 g/dL (ref 30.0–36.0)
MCV: 92.3 fL (ref 78.0–100.0)
Platelets: 250 10*3/uL (ref 150–400)
RBC: 4.41 MIL/uL (ref 3.87–5.11)
RDW: 12.1 % (ref 11.5–15.5)
WBC: 6.1 10*3/uL (ref 4.0–10.5)

## 2015-04-02 LAB — I-STAT BETA HCG BLOOD, ED (MC, WL, AP ONLY)

## 2015-04-02 IMAGING — CT CT ABD-PELV W/ CM
2 of 4 series · 16 of 46 positions shown, 18 images · IV contrast (100 ML OMNI 300)
Comparison: None.

CLINICAL DATA: Periumbilical pain today. Nausea. Diarrhea with dark
blood. Family history of Crohn's disease.

EXAM:
CT ABDOMEN AND PELVIS WITH CONTRAST
TECHNIQUE: Multidetector CT imaging of the abdomen and pelvis was performed
using the standard protocol following bolus administration of
intravenous contrast.
CONTRAST:  100mL OMNIPAQUE IOHEXOL 300 MG/ML  SOLN

[Series 2: abd/pel with · axial · 0.64mm/px · z∈[+1045,+1455]mm · 13 of 90 slices shown, 15 images]
[im 4/90  soft-tissue]
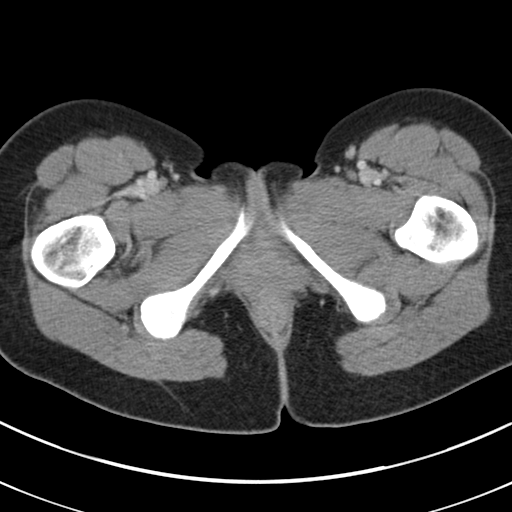
[im 4/90  bone]
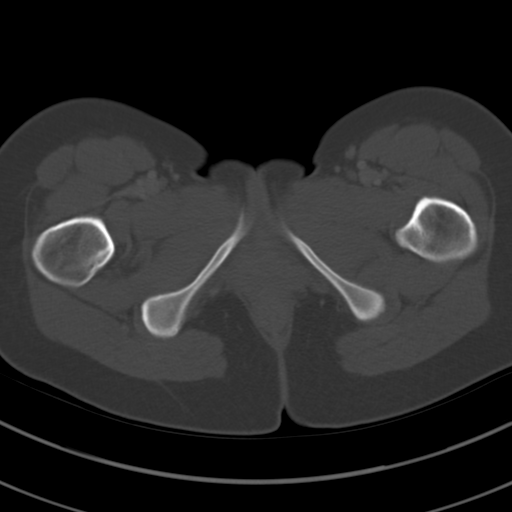
[im 11/90  soft-tissue]
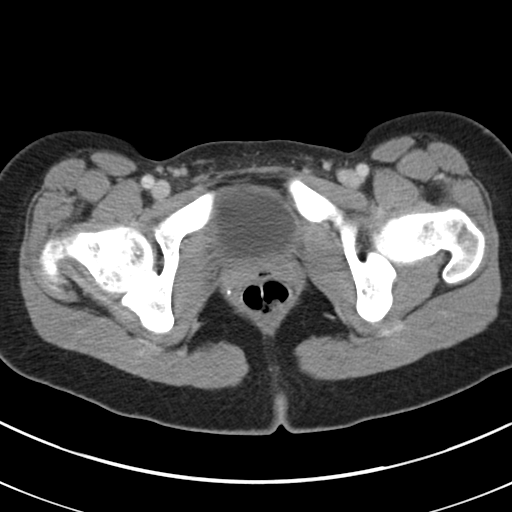
[im 18/90  soft-tissue]
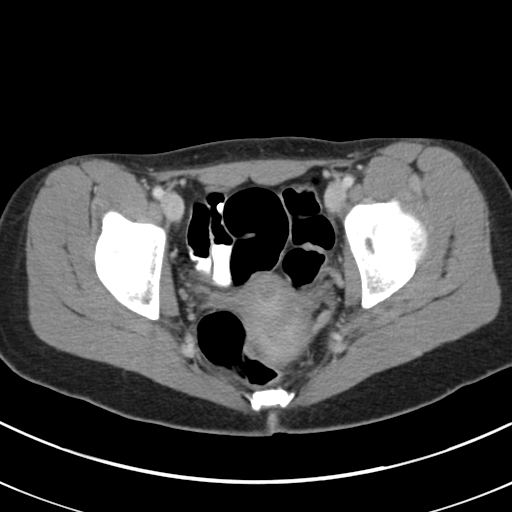
[im 24/90  soft-tissue]
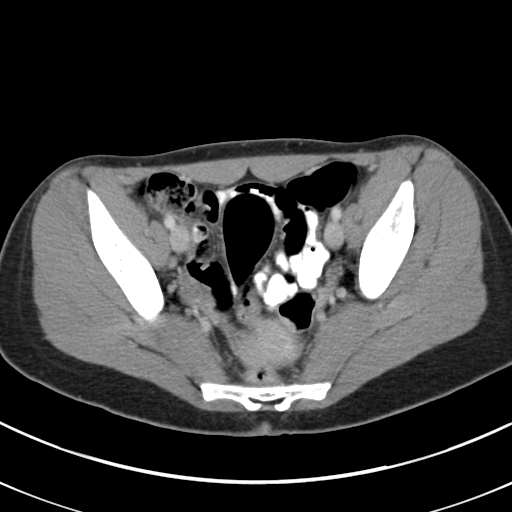
[im 31/90  soft-tissue]
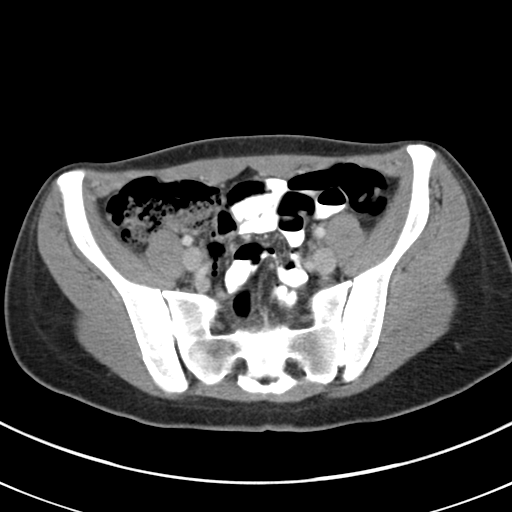
[im 38/90  soft-tissue]
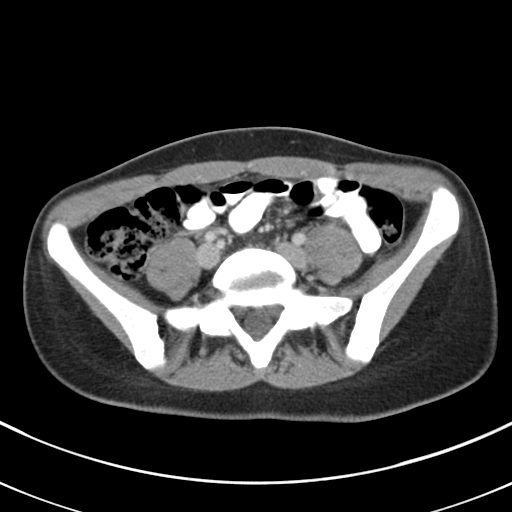
[im 45/90  soft-tissue]
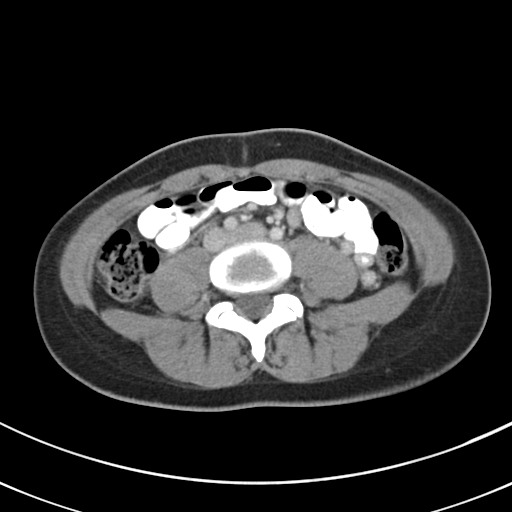
[im 52/90  soft-tissue]
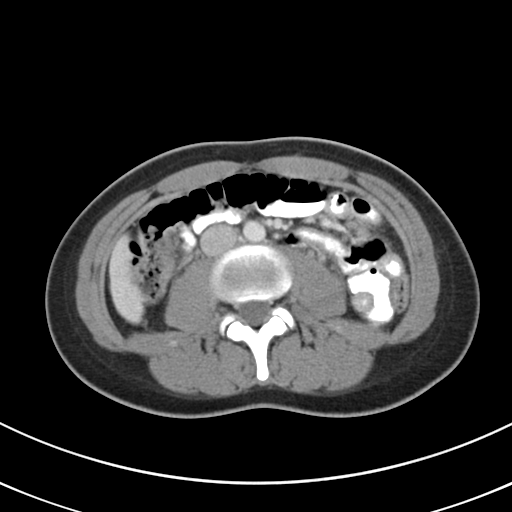
[im 59/90  soft-tissue]
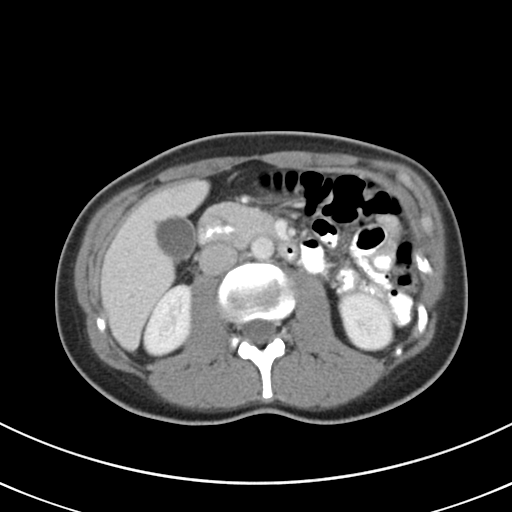
[im 59/90  bone]
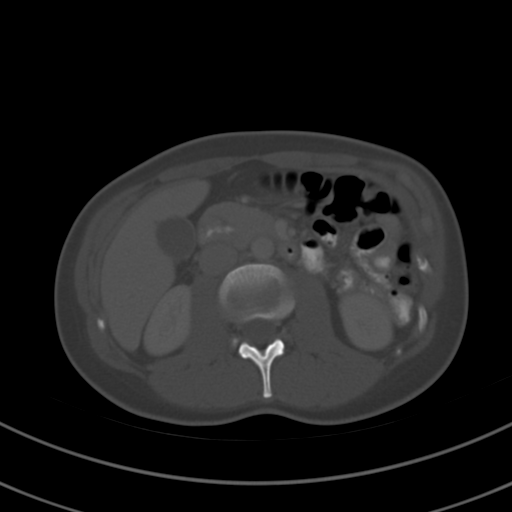
[im 66/90  soft-tissue]
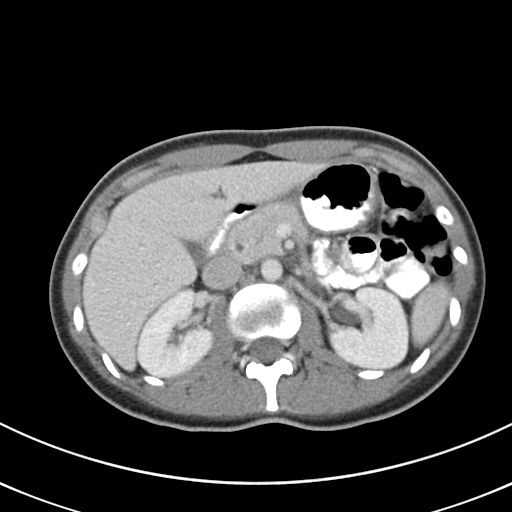
[im 72/90  soft-tissue]
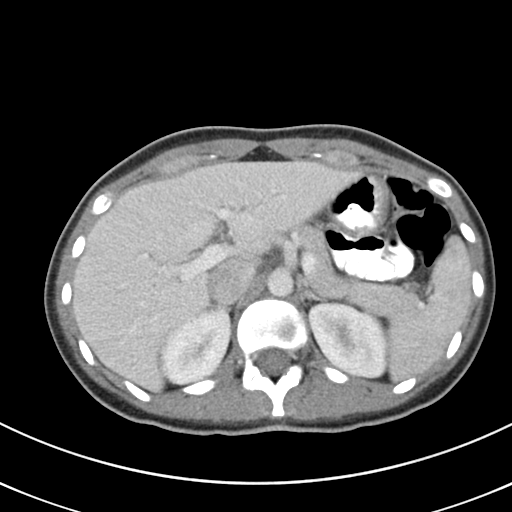
[im 79/90  soft-tissue]
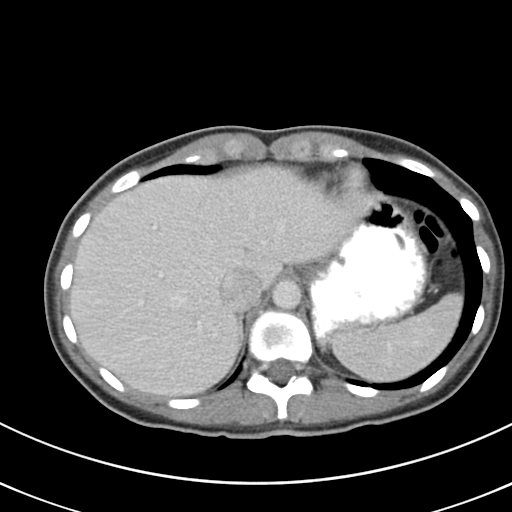
[im 86/90  soft-tissue]
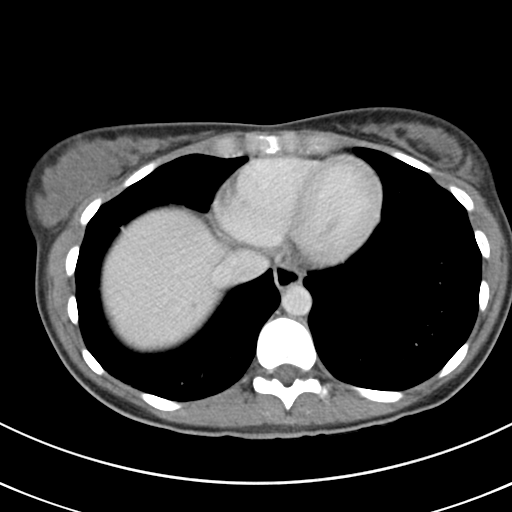

[Series 4: coronal a/|p · coronal · 0.63mm/px · 3 of 64 slices shown]
[im 22/64  soft-tissue]
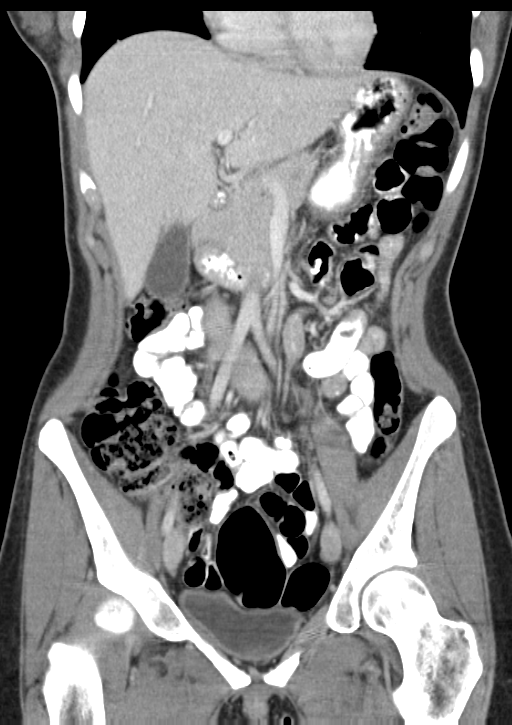
[im 29/64  soft-tissue]
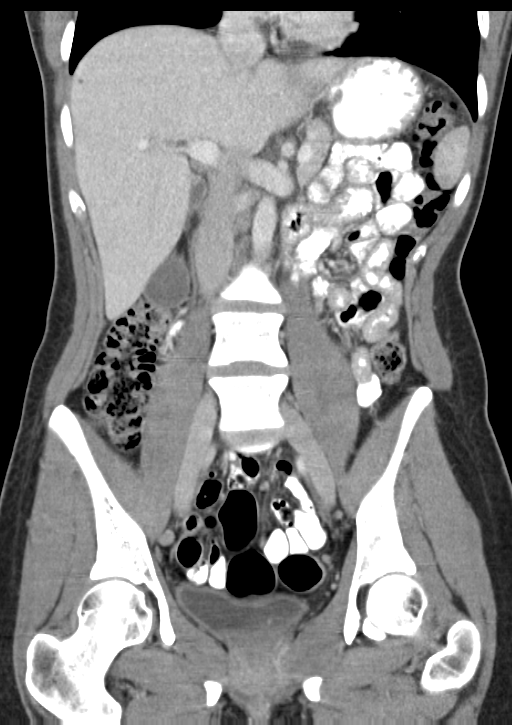
[im 36/64  soft-tissue]
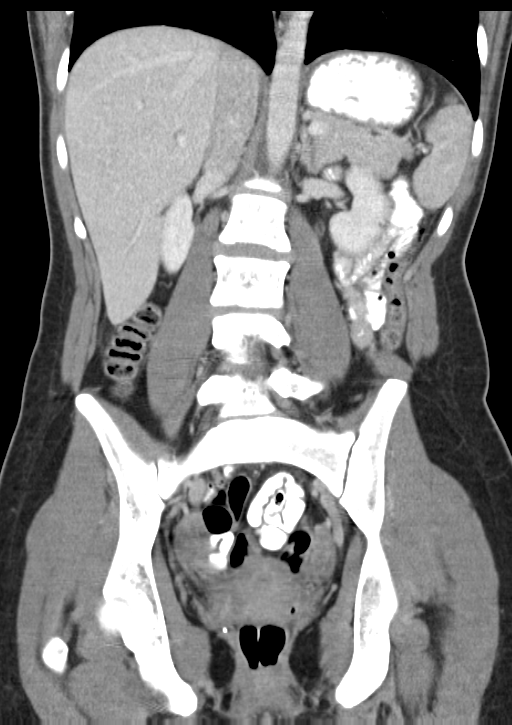

[16 of 46 positions shown; findings below may reference images not displayed]

FINDINGS: Lower chest:  The included lung bases are clear.

Liver: Tiny subcentimeter hypodensities in the right hepatic lobe,
too small to characterize. No suspicious hepatic lesion.

Hepatobiliary: Gallbladder physiologically distended, no calcified
stone. No biliary dilatation.

Pancreas: Normal.  No ductal dilatation or inflammation.

Spleen: Normal.

Adrenal glands: No nodule.

Kidneys: Symmetric renal enhancement. No hydronephrosis. No
perinephric stranding or localizing renal abnormality.

Stomach/Bowel: Stomach physiologically distended. There are no
dilated or thickened small bowel loops. No inflammation of the
terminal ileum. Small volume formed stool and air throughout normal
caliber colon without colonic wall thickening. Mild tortuosity of
the sigmoid colon. The appendix is air-filled and normal.

Vascular/Lymphatic: No retroperitoneal adenopathy. Abdominal aorta
is normal in caliber.

Reproductive: Uterus is retroverted. Ovaries symmetric in size. No
adnexal mass.

Bladder: Physiologically distended, no wall thickening.

Other: No free air, free fluid, or intra-abdominal fluid collection.

Musculoskeletal: There are no acute or suspicious osseous
abnormalities. Very minimal broad-based dextroscoliotic curvature of
the spine.
IMPRESSION: No acute abnormality in the abdomen/pelvis. No bowel inflammation or
signs of inflammatory bowel disease.

## 2015-04-02 MED ORDER — KETOROLAC TROMETHAMINE 30 MG/ML IJ SOLN
30.0000 mg | Freq: Once | INTRAMUSCULAR | Status: AC
  Administered 2015-04-02: 30 mg via INTRAVENOUS
  Filled 2015-04-02: qty 1

## 2015-04-02 MED ORDER — IOHEXOL 300 MG/ML  SOLN
100.0000 mL | Freq: Once | INTRAMUSCULAR | Status: AC | PRN
  Administered 2015-04-02: 100 mL via INTRAVENOUS

## 2015-04-02 MED ORDER — IOHEXOL 300 MG/ML  SOLN
50.0000 mL | Freq: Once | INTRAMUSCULAR | Status: AC | PRN
  Administered 2015-04-02: 50 mL via ORAL

## 2015-04-02 MED ORDER — ONDANSETRON HCL 4 MG/2ML IJ SOLN
4.0000 mg | Freq: Once | INTRAMUSCULAR | Status: AC
  Administered 2015-04-02: 4 mg via INTRAVENOUS
  Filled 2015-04-02: qty 2

## 2015-04-02 MED ORDER — DICYCLOMINE HCL 10 MG/ML IM SOLN
20.0000 mg | Freq: Once | INTRAMUSCULAR | Status: AC
  Administered 2015-04-02: 20 mg via INTRAMUSCULAR
  Filled 2015-04-02: qty 2

## 2015-04-02 MED ORDER — ONDANSETRON HCL 4 MG PO TABS: 4.0000 mg | ORAL_TABLET | Freq: Four times a day (QID) | ORAL | Status: DC

## 2015-04-02 MED ORDER — MORPHINE SULFATE (PF) 4 MG/ML IV SOLN
4.0000 mg | Freq: Once | INTRAVENOUS | Status: AC
  Administered 2015-04-02: 4 mg via INTRAVENOUS
  Filled 2015-04-02: qty 1

## 2015-04-02 MED ORDER — SODIUM CHLORIDE 0.9 % IV BOLUS (SEPSIS)
1000.0000 mL | Freq: Once | INTRAVENOUS | Status: AC
  Administered 2015-04-02: 1000 mL via INTRAVENOUS

## 2015-04-02 MED ORDER — DICYCLOMINE HCL 20 MG PO TABS: 20.0000 mg | ORAL_TABLET | Freq: Three times a day (TID) | ORAL | Status: DC

## 2015-04-02 NOTE — ED Notes (Signed)
Discharge instructions and rx x2 reviewed with patient. Patient verbalized understanding. 

## 2015-04-02 NOTE — Discharge Instructions (Signed)
Avoid eating fried, spicy, or greasy foods for the next couple days. Drink lots of fluids today. This afternoon after feeling better you can have a bland diet such as toast, crackers, Jell-O, or [employer] chicken soup. Take the Bentyl for the abdominal spasms. Use the Zofran for the nausea or vomiting. Left ear doctor's office know about your ED visit today. You should have a gastroenterology referral done to evaluate the bloody salt in your diarrhea.  Return to the ED if you get fever, worsening pain, or have lots of rectal bleeding.

## 2015-04-02 NOTE — ED Provider Notes (Signed)
CSN: [id number]     Arrival date & time 04/01/15  2253 History  By signing my name below, I, Tanda Rockers, attest that this documentation has been prepared under the direction and in the presence of Devoria Albe, MD at 0130. Electronically Signed: Tanda Rockers, ED Scribe. 04/02/2015. 1:44 AM.    Chief Complaint  Patient presents with  . Abdominal Pain   The history is provided by the patient. No language interpreter was used.     HPI Comments: Tiffany Whitehead is a 24 y.o. female who presents to the Emergency Department complaining of gradual onset, constant, abdominal pain behind the umbilicus that began around 6 PM today (approximately 7.5 hours ago), gradually worsening. She states she feels like she has a band around her abdomen encompassing the umbilicus. She has had nausea and dry heaving. She has never had this before. She denies eating anything different. The pain is exacerbated with sitting up and mildly alleviated with curling up and applying pressure to the area. She did eat normally today but reports that the abdominal pain worsened after eating. Pt notes that in the morning she had loose stools prior to the abdominal pain beginning. She notes that when she has bowel movements she feels very fatigued afterwards. She has had approximately 8-9 episodes of loose stools. She notes that later on in the day she noticed dark red blood in her stools twice, causing her to come to the ED. Pt also complains of feeling lightheaded, nauseous, and dry heaving. She states she has felt flushed and is unsure if she had fever. She took [place], [place], and [place] without relief. Denies fever, chills, or any other associated symptoms. Fhx Crohn's with mother. Maternal grandmother and mother both had colon cancer. Pt is non smoker. Occasional EtOH drinker, no EtOH in the past couple of days.   PCP Dr Dalbert Garnet at [facility] in [place]   History reviewed. No pertinent past medical history. Past Surgical  History  Procedure Laterality Date  . Extraction of wisdom teeth     Family History  Problem Relation Age of Onset  . Cancer Mother   . Cancer Other    Social History  Substance Use Topics  . Smoking status: Never Smoker   . Smokeless tobacco: None  . Alcohol Use: Yes     Comment: social   OB History    No data available     Review of Systems  Constitutional: Positive for fatigue. Negative for fever and chills.  Gastrointestinal: Positive for nausea, diarrhea and blood in stool. Negative for vomiting.       + Dry heaving  Neurological: Positive for light-headedness.  All other systems reviewed and are negative.  Allergies  Ceclor  Home Medications   Prior to Admission medications   Medication Sig Start Date End Date Taking? Authorizing Provider  Ca Carbonate-Mag Hydroxide (ROLAIDS PO) Take 1 tablet by mouth daily.   Yes Historical Provider, MD  calcium carbonate (TUMS - DOSED IN MG ELEMENTAL CALCIUM) 500 MG chewable tablet Chew 1 tablet by mouth daily.   Yes Historical Provider, MD  drospirenone-ethinyl estradiol (YAZ,GIANVI,LORYNA) 3-0.02 MG tablet Take 1 tablet by mouth daily.   Yes Historical Provider, MD  ibuprofen (ADVIL,MOTRIN) 200 MG tablet Take 600 mg by mouth every 6 (six) hours as needed for moderate pain.   Yes Historical Provider, MD  lisdexamfetamine (VYVANSE) 30 MG capsule Take 30 mg by mouth daily.   Yes Historical Provider, MD  simethicone (GAS RELIEF) 80 MG  chewable tablet Chew 80 mg by mouth every 6 (six) hours as needed for flatulence.   Yes Historical Provider, MD  dicyclomine (BENTYL) 20 MG tablet Take 1 tablet (20 mg total) by mouth 4 (four) times daily -  before meals and at bedtime. 04/02/15   Devoria AlbeIva Dyon Rotert, MD  LORazepam (ATIVAN) 1 MG tablet Take 1 tablet (1 mg total) by mouth 3 (three) times daily as needed for anxiety. Patient not taking: Reported on 08/30/2014 03/20/13   Tiffany Kirichenko, PA-C  ondansetron (ZOFRAN) 4 MG tablet Take 1 tablet (4 mg  total) by mouth every 6 (six) hours. 04/02/15   Devoria AlbeIva Dayannara Pascal, MD   Triage VItals: BP 126/98 mmHg  Pulse 102  Temp(Src) 98.3 F (36.8 C) (Oral)  Resp 16  SpO2 100%  LMP 03/24/2015 (Approximate)   Physical Exam  Constitutional: She is oriented to person, place, and time. She appears well-developed and well-nourished.  Non-toxic appearance. She does not appear ill. No distress.  HENT:  Head: Normocephalic and atraumatic.  Right Ear: External ear normal.  Left Ear: External ear normal.  Nose: Nose normal. No mucosal edema or rhinorrhea.  Mouth/Throat: Oropharynx is clear and moist and mucous membranes are normal. No dental abscesses or uvula swelling.  Eyes: Conjunctivae and EOM are normal. Pupils are equal, round, and reactive to light.  Neck: Normal range of motion and full passive range of motion without pain. Neck supple.  Cardiovascular: Normal rate, regular rhythm and normal heart sounds.  Exam reveals no gallop and no friction rub.   No murmur heard. Pulmonary/Chest: Effort normal and breath sounds normal. No respiratory distress. She has no wheezes. She has no rhonchi. She has no rales. She exhibits no tenderness and no crepitus.  Abdominal: Soft. Normal appearance and bowel sounds are normal. She exhibits no distension. There is tenderness. There is no rebound and no guarding.  Diffuse lower abdominal pain that seemed worse in the RLQ  Musculoskeletal: Normal range of motion. She exhibits no edema or tenderness.  Moves all extremities well.   Neurological: She is alert and oriented to person, place, and time. She has normal strength. No cranial nerve deficit.  Skin: Skin is warm, dry and intact. No rash noted. No erythema. No pallor.  Psychiatric: She has a normal mood and affect. Her speech is normal and behavior is normal. Her mood appears not anxious.  Nursing note and vitals reviewed.   ED Course  Procedures (including critical care time)  DIAGNOSTIC STUDIES: Oxygen  Saturation is 100% on RA, normal by my interpretation.    COORDINATION OF CARE: 1:41 AM-Discussed treatment plan which includes CT A/P with pt at bedside and pt agreed to plan.   Patient was given IV fluids and IV nausea medication. We discussed doing a CT scan to see if she has inflammatory bowel disease or even possibly appendicitis.  4:35 AM I discussed patient CT results with her. She states her pain is starting to return. She was given IV Toradol.  Patient was rechecked at 6:40 AM she states her pain is still there. She was given Bentyl IM.  Recheck at time of discharge patient states her pain is almost gone and she is feeling better. We discussed the likely clear liquid diet and advancing her diet slowly as she improves. She is to follow-up with her PCP to get GI referral for her abdominal pain in the blood she saw in her diarrhea.   Labs Review Results for orders placed or performed during the hospital  encounter of 04/01/15  Lipase, blood  Result Value Ref Range   Lipase 47 11 - 51 U/L  Comprehensive metabolic panel  Result Value Ref Range   Sodium 137 135 - 145 mmol/L   Potassium 3.9 3.5 - 5.1 mmol/L   Chloride 104 101 - 111 mmol/L   CO2 25 22 - 32 mmol/L   Glucose, Bld 106 (H) 65 - 99 mg/dL   BUN 7 6 - 20 mg/dL   Creatinine, Ser 1.61 0.44 - 1.00 mg/dL   Calcium 9.8 8.9 - 09.6 mg/dL   Total Protein 8.1 6.5 - 8.1 g/dL   Albumin 4.6 3.5 - 5.0 g/dL   AST 20 15 - 41 U/L   ALT 15 14 - 54 U/L   Alkaline Phosphatase 38 38 - 126 U/L   Total Bilirubin 0.7 0.3 - 1.2 mg/dL   GFR calc non Af Amer >60 >60 mL/min   GFR calc Af Amer >60 >60 mL/min   Anion gap 8 5 - 15  CBC  Result Value Ref Range   WBC 6.1 4.0 - 10.5 K/uL   RBC 4.41 3.87 - 5.11 MIL/uL   Hemoglobin 13.4 12.0 - 15.0 g/dL   HCT 04.5 40.9 - 81.1 %   MCV 92.3 78.0 - 100.0 fL   MCH 30.4 26.0 - 34.0 pg   MCHC 32.9 30.0 - 36.0 g/dL   RDW 91.4 78.2 - 95.6 %   Platelets 250 150 - 400 K/uL  Urinalysis, Routine w reflex  microscopic (not at [facility])  Result Value Ref Range   Color, Urine YELLOW YELLOW   APPearance CLEAR CLEAR   Specific Gravity, Urine 1.006 1.005 - 1.030   pH 6.5 5.0 - 8.0   Glucose, UA NEGATIVE NEGATIVE mg/dL   Hgb urine dipstick NEGATIVE NEGATIVE   Bilirubin Urine NEGATIVE NEGATIVE   Ketones, ur 15 (A) NEGATIVE mg/dL   Protein, ur NEGATIVE NEGATIVE mg/dL   Urobilinogen, UA 0.2 0.0 - 1.0 mg/dL   Nitrite NEGATIVE NEGATIVE   Leukocytes, UA NEGATIVE NEGATIVE  I-Stat beta hCG blood, ED ([facility], [facility], [facility] only)  Result Value Ref Range   I-stat hCG, quantitative <5.0 <5 mIU/mL   Comment 3               Imaging Review Ct Abdomen Pelvis W Contrast  04/02/2015  CLINICAL DATA:  Periumbilical pain today. Nausea. Diarrhea with dark blood. Family history of Crohn's disease. EXAM: CT ABDOMEN AND PELVIS WITH CONTRAST TECHNIQUE: Multidetector CT imaging of the abdomen and pelvis was performed using the standard protocol following bolus administration of intravenous contrast. CONTRAST:  OMNIPAQUE IOHEXOL 300 MG/ML  SOLN COMPARISON:  None. FINDINGS: Lower chest:  The included lung bases are clear. Liver: Tiny subcentimeter hypodensities in the right hepatic lobe, too small to characterize. No suspicious hepatic lesion. Hepatobiliary: Gallbladder physiologically distended, no calcified stone. No biliary dilatation. Pancreas: Normal.  No ductal dilatation or inflammation. Spleen: Normal. Adrenal glands: No nodule. Kidneys: Symmetric renal enhancement. No hydronephrosis. No perinephric stranding or localizing renal abnormality. Stomach/Bowel: Stomach physiologically distended. There are no dilated or thickened small bowel loops. No inflammation of the terminal ileum. Small volume formed stool and air throughout normal caliber colon without colonic wall thickening. Mild tortuosity of the sigmoid colon. The appendix is air-filled and normal. Vascular/Lymphatic: No retroperitoneal adenopathy. Abdominal aorta is  normal in caliber. Reproductive: Uterus is retroverted. Ovaries symmetric in size. No adnexal mass. Bladder: Physiologically distended, no wall thickening. Other: No free air, free fluid, or  intra-abdominal fluid collection. Musculoskeletal: There are no acute or suspicious osseous abnormalities. Very minimal broad-based dextroscoliotic curvature of the spine. IMPRESSION: No acute abnormality in the abdomen/pelvis. No bowel inflammation or signs of inflammatory bowel disease. Electronically Signed   By: Rubye Oaks M.D.   On: 04/02/2015 04:02   I have personally reviewed and evaluated these images and lab results as part of my medical decision-making.   EKG Interpretation None      MDM   Final diagnoses:  Periumbilical abdominal pain   New Prescriptions   DICYCLOMINE (BENTYL) 20 MG TABLET    Take 1 tablet (20 mg total) by mouth 4 (four) times daily -  before meals and at bedtime.   ONDANSETRON (ZOFRAN) 4 MG TABLET    Take 1 tablet (4 mg total) by mouth every 6 (six) hours.    Plan discharge  Devoria Albe, MD, FACEP    I personally performed the services described in this documentation, which was scribed in my presence. The recorded information has been reviewed and considered.  Devoria Albe, MD, Concha Pyo, MD 04/02/15 [phone]

## 2015-07-04 DIAGNOSIS — Z800 Family history of malignant neoplasm of digestive organs: Secondary | ICD-10-CM | POA: Insufficient documentation

## 2015-07-04 DIAGNOSIS — K900 Celiac disease: Secondary | ICD-10-CM | POA: Insufficient documentation

## 2015-07-04 DIAGNOSIS — E739 Lactose intolerance, unspecified: Secondary | ICD-10-CM | POA: Insufficient documentation

## 2015-07-04 DIAGNOSIS — F902 Attention-deficit hyperactivity disorder, combined type: Secondary | ICD-10-CM | POA: Insufficient documentation

## 2015-07-04 DIAGNOSIS — F411 Generalized anxiety disorder: Secondary | ICD-10-CM | POA: Insufficient documentation

## 2016-08-09 ENCOUNTER — Encounter (HOSPITAL_COMMUNITY): Payer: Self-pay

## 2016-08-09 ENCOUNTER — Emergency Department (HOSPITAL_COMMUNITY)
Admission: EM | Admit: 2016-08-09 | Discharge: 2016-08-09 | Disposition: A | Discharge status: Home / Self Care | Payer: BLUE CROSS/BLUE SHIELD | Attending: Emergency Medicine | Admitting: Emergency Medicine

## 2016-08-09 ENCOUNTER — Emergency Department (HOSPITAL_COMMUNITY): Payer: BLUE CROSS/BLUE SHIELD

## 2016-08-09 DIAGNOSIS — S0093XA Contusion of unspecified part of head, initial encounter: Secondary | ICD-10-CM | POA: Insufficient documentation

## 2016-08-09 DIAGNOSIS — Z79899 Other long term (current) drug therapy: Secondary | ICD-10-CM | POA: Diagnosis not present

## 2016-08-09 DIAGNOSIS — N3000 Acute cystitis without hematuria: Secondary | ICD-10-CM | POA: Diagnosis not present

## 2016-08-09 DIAGNOSIS — M542 Cervicalgia: Secondary | ICD-10-CM

## 2016-08-09 DIAGNOSIS — W228XXA Striking against or struck by other objects, initial encounter: Secondary | ICD-10-CM | POA: Insufficient documentation

## 2016-08-09 DIAGNOSIS — Y999 Unspecified external cause status: Secondary | ICD-10-CM | POA: Insufficient documentation

## 2016-08-09 DIAGNOSIS — Y939 Activity, unspecified: Secondary | ICD-10-CM | POA: Insufficient documentation

## 2016-08-09 DIAGNOSIS — R072 Precordial pain: Secondary | ICD-10-CM | POA: Diagnosis not present

## 2016-08-09 DIAGNOSIS — S0993XA Unspecified injury of face, initial encounter: Secondary | ICD-10-CM | POA: Diagnosis not present

## 2016-08-09 DIAGNOSIS — R4589 Other symptoms and signs involving emotional state: Secondary | ICD-10-CM

## 2016-08-09 DIAGNOSIS — S0990XA Unspecified injury of head, initial encounter: Secondary | ICD-10-CM | POA: Diagnosis present

## 2016-08-09 DIAGNOSIS — Y929 Unspecified place or not applicable: Secondary | ICD-10-CM | POA: Insufficient documentation

## 2016-08-09 DIAGNOSIS — H532 Diplopia: Secondary | ICD-10-CM | POA: Diagnosis not present

## 2016-08-09 DIAGNOSIS — W19XXXA Unspecified fall, initial encounter: Secondary | ICD-10-CM

## 2016-08-09 LAB — URINALYSIS, ROUTINE W REFLEX MICROSCOPIC
Bilirubin Urine: NEGATIVE
GLUCOSE, UA: NEGATIVE mg/dL
KETONES UR: NEGATIVE mg/dL
LEUKOCYTES UA: NEGATIVE
NITRITE: POSITIVE — AB
PH: 5 (ref 5.0–8.0)
Protein, ur: NEGATIVE mg/dL
SPECIFIC GRAVITY, URINE: 1.015 (ref 1.005–1.030)

## 2016-08-09 LAB — COMPREHENSIVE METABOLIC PANEL
ALK PHOS: 42 U/L (ref 38–126)
ALT: 16 U/L (ref 14–54)
ANION GAP: 11 (ref 5–15)
AST: 21 U/L (ref 15–41)
Albumin: 4.3 g/dL (ref 3.5–5.0)
BILIRUBIN TOTAL: 0.4 mg/dL (ref 0.3–1.2)
BUN: 9 mg/dL (ref 6–20)
CO2: 21 mmol/L — AB (ref 22–32)
Calcium: 9.1 mg/dL (ref 8.9–10.3)
Chloride: 109 mmol/L (ref 101–111)
Creatinine, Ser: 0.74 mg/dL (ref 0.44–1.00)
Glucose, Bld: 103 mg/dL — ABNORMAL HIGH (ref 65–99)
Potassium: 3.9 mmol/L (ref 3.5–5.1)
SODIUM: 141 mmol/L (ref 135–145)
TOTAL PROTEIN: 8 g/dL (ref 6.5–8.1)

## 2016-08-09 LAB — CBC WITH DIFFERENTIAL/PLATELET
Basophils Absolute: 0 10*3/uL (ref 0.0–0.1)
Basophils Relative: 0 %
EOS ABS: 0 10*3/uL (ref 0.0–0.7)
Eosinophils Relative: 1 %
HCT: 39.7 % (ref 36.0–46.0)
HEMOGLOBIN: 13.3 g/dL (ref 12.0–15.0)
LYMPHS ABS: 2 10*3/uL (ref 0.7–4.0)
LYMPHS PCT: 30 %
MCH: 30.6 pg (ref 26.0–34.0)
MCHC: 33.5 g/dL (ref 30.0–36.0)
MCV: 91.5 fL (ref 78.0–100.0)
MONOS PCT: 6 %
Monocytes Absolute: 0.4 10*3/uL (ref 0.1–1.0)
NEUTROS PCT: 63 %
Neutro Abs: 4.2 10*3/uL (ref 1.7–7.7)
Platelets: 278 10*3/uL (ref 150–400)
RBC: 4.34 MIL/uL (ref 3.87–5.11)
RDW: 12.9 % (ref 11.5–15.5)
WBC: 6.6 10*3/uL (ref 4.0–10.5)

## 2016-08-09 LAB — I-STAT TROPONIN, ED: Troponin i, poc: 0 ng/mL (ref 0.00–0.08)

## 2016-08-09 LAB — I-STAT CG4 LACTIC ACID, ED
Lactic Acid, Venous: 2.31 mmol/L (ref 0.5–1.9)
Lactic Acid, Venous: 1.87 mmol/L (ref 0.5–1.9)

## 2016-08-09 LAB — I-STAT BETA HCG BLOOD, ED (MC, WL, AP ONLY)

## 2016-08-09 LAB — LIPASE, BLOOD: LIPASE: 37 U/L (ref 11–51)

## 2016-08-09 LAB — PROTIME-INR
INR: 0.94
Prothrombin Time: 12.5 seconds (ref 11.4–15.2)

## 2016-08-09 LAB — D-DIMER, QUANTITATIVE: D-Dimer, Quant: 0.46 ug/mL-FEU (ref 0.00–0.50)

## 2016-08-09 IMAGING — CR DG CHEST 2V
2 series · 2 of 2 positions shown · non-contrast
Comparison: None.

CLINICAL DATA: Patient with syncopal episode.  Anxiety.

EXAM:
CHEST  2 VIEW

[w chest lat (1 of 2)]
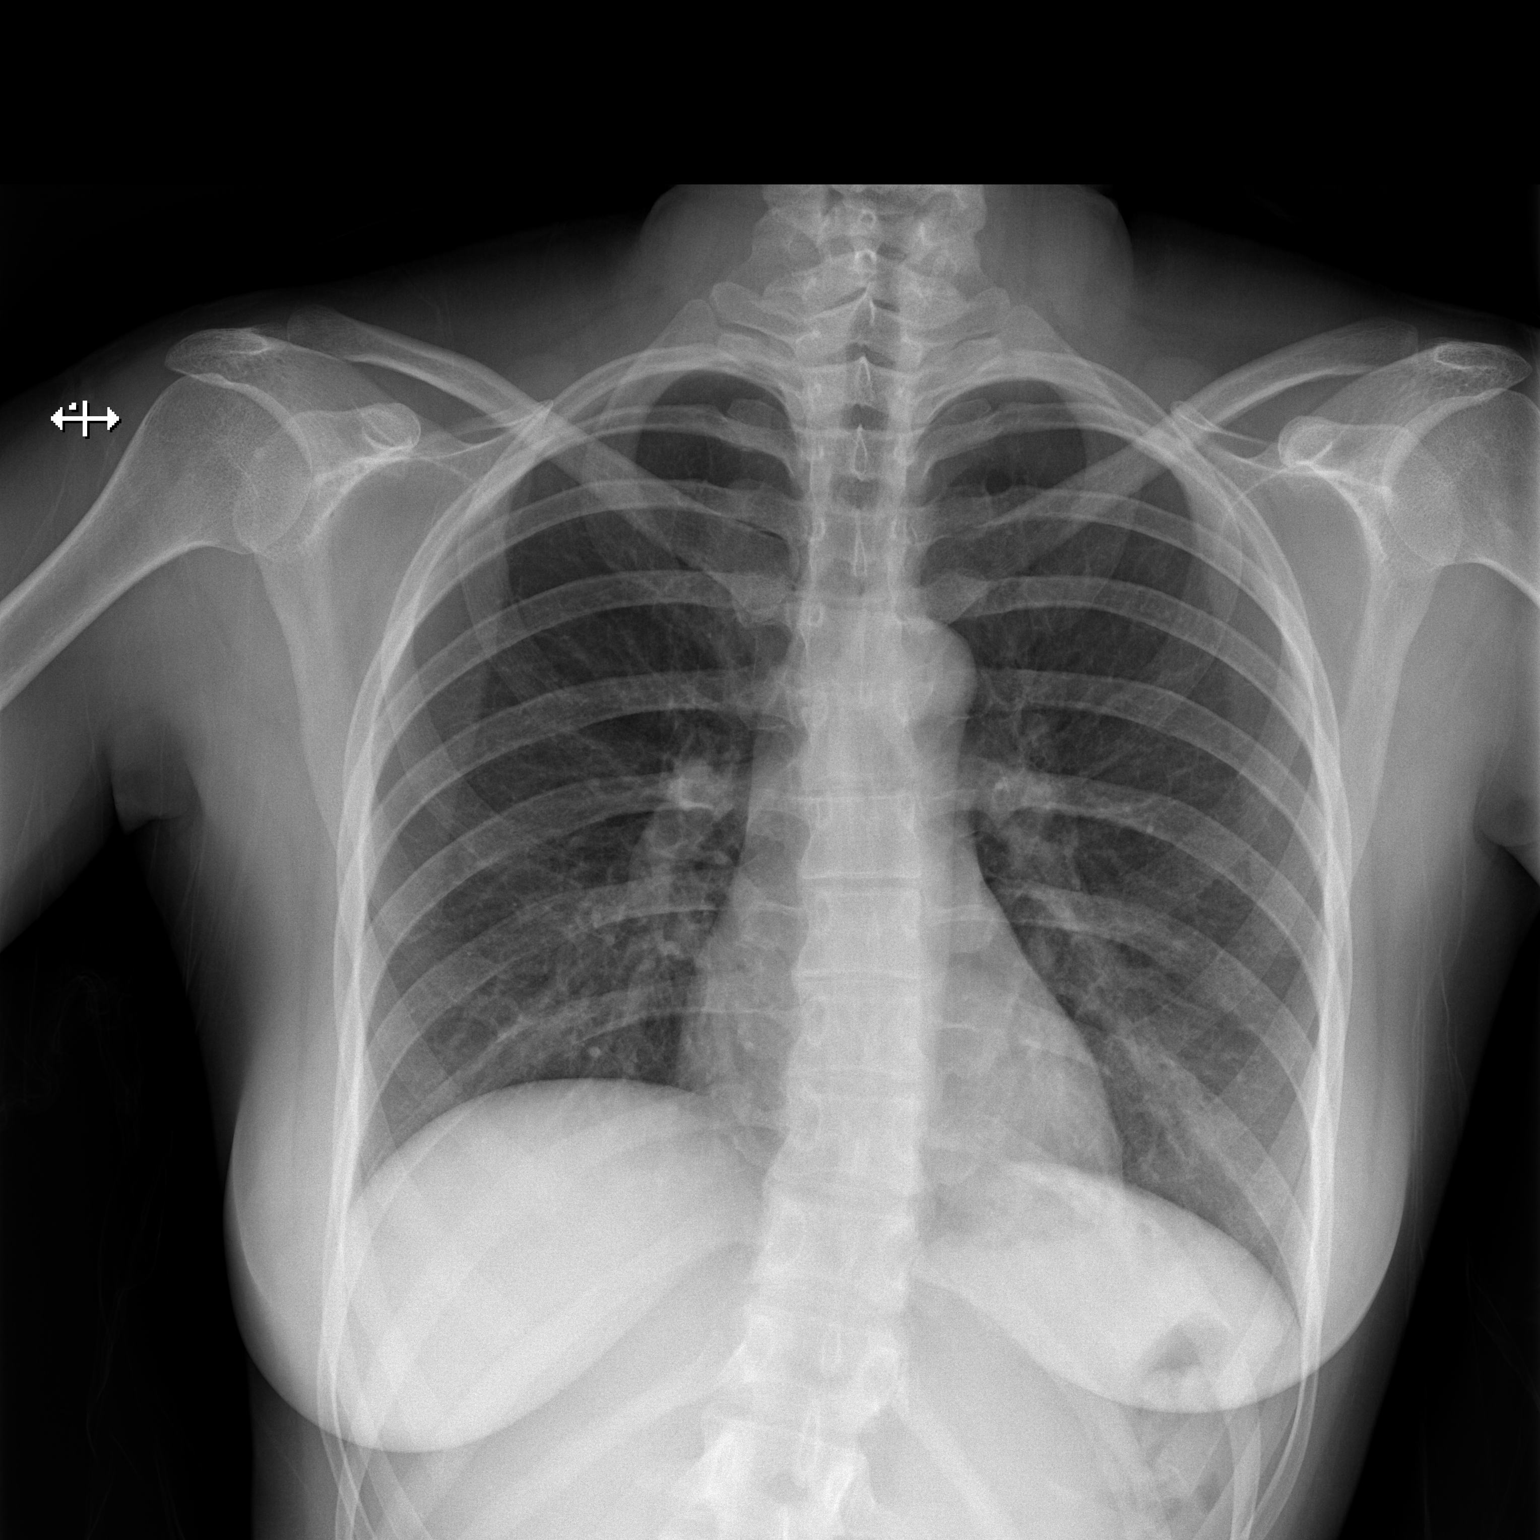

[w chest lat (2 of 2)]
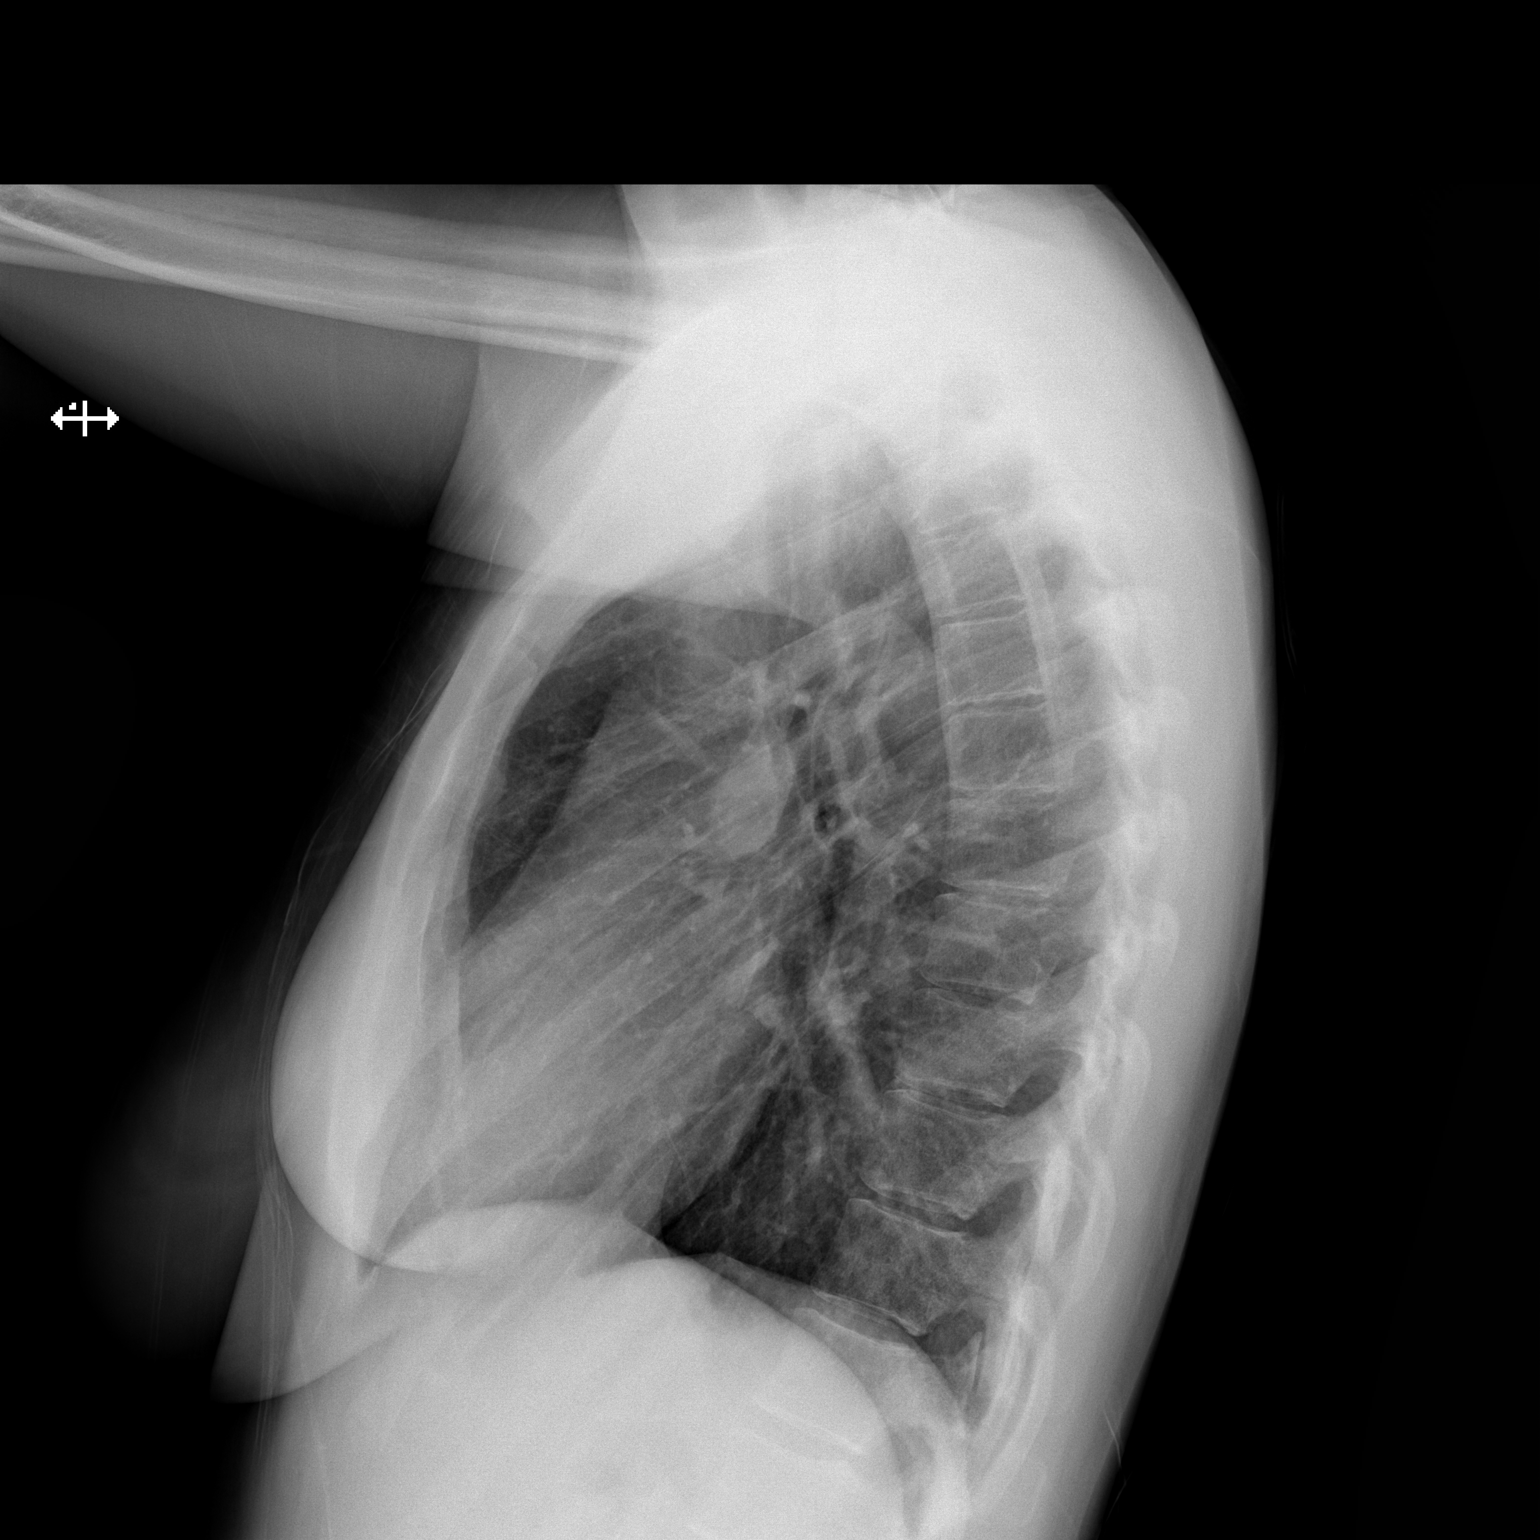

[2 of 2 positions shown; findings below may reference images not displayed]

FINDINGS: Normal cardiac and mediastinal contours. No consolidative pulmonary
opacities. No pleural effusion or pneumothorax. Mid thoracic spine
degenerative changes.
IMPRESSION: No active cardiopulmonary disease.

## 2016-08-09 IMAGING — CT CT HEAD W/O CM
5 of 11 series · 17 of 47 positions shown, 19 images · non-contrast
Comparison: None.

CLINICAL DATA: Patient status post fall with left supraorbital
swelling. Neck pain.

EXAM:
CT HEAD WITHOUT CONTRAST
CT MAXILLOFACIAL WITHOUT CONTRAST
CT CERVICAL SPINE WITHOUT CONTRAST
TECHNIQUE: Multidetector CT imaging of the head, cervical spine, and
maxillofacial structures were performed using the standard protocol
without intravenous contrast. Multiplanar CT image reconstructions
of the cervical spine and maxillofacial structures were also
generated.

[Series 3: facial st · axial · 0.31mm/px · z∈[-248,-134]mm · 5 of 87 slices shown (1 of 2)]
[im 15/87  brain]
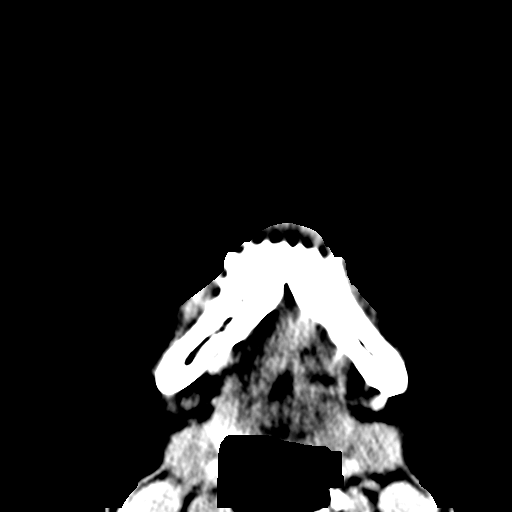
[im 29/87  brain]
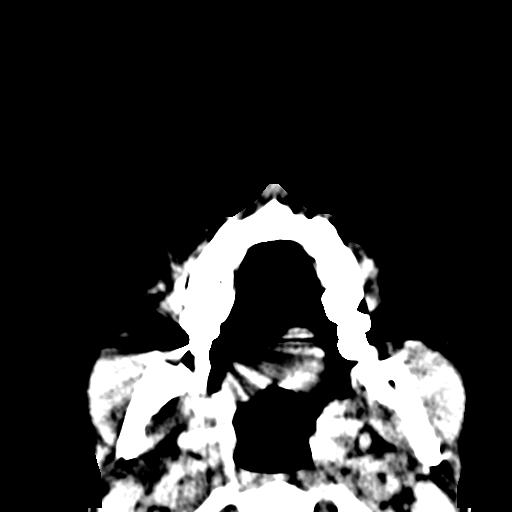
[im 44/87  brain]
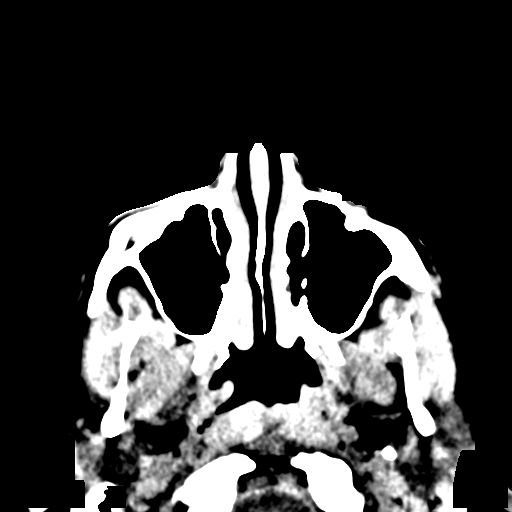
[im 58/87  brain]
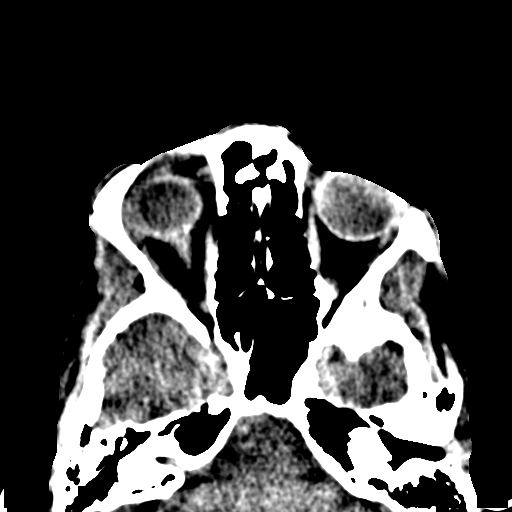
[im 72/87  brain]
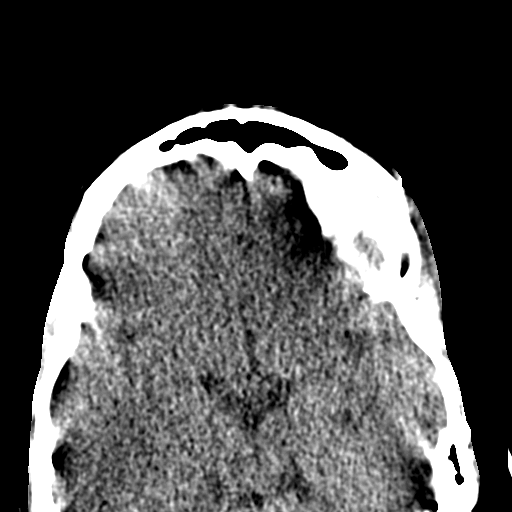

[Series 11: bone windows · axial · 0.43mm/px · z∈[-144,-106]mm · 2 of 51 slices shown]
[im 13/51  bone]
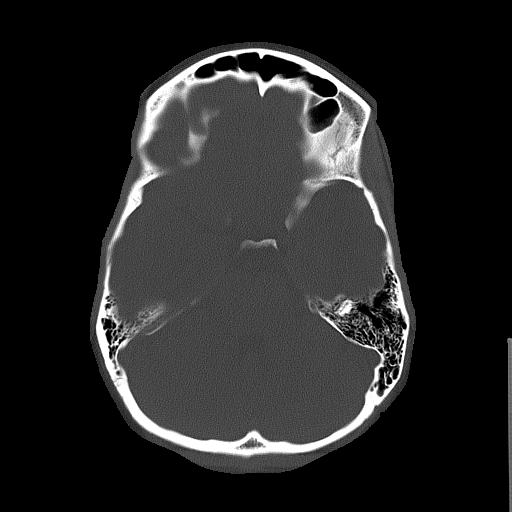
[im 26/51  bone]
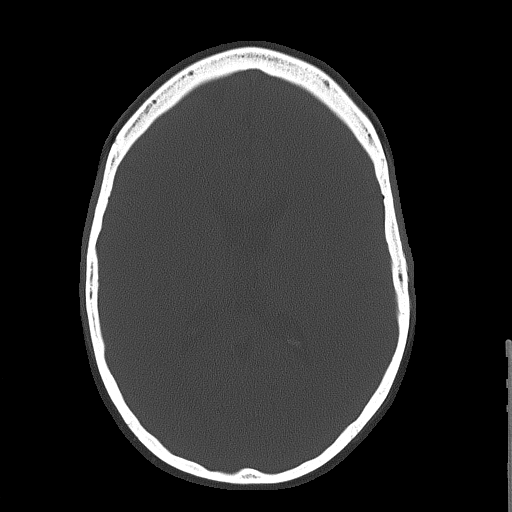

[Series 13: facial st · axial · 0.31mm/px · z∈[-252,-130]mm · 6 of 87 slices shown, 8 images (2 of 2)]
[im 13/87  brain]
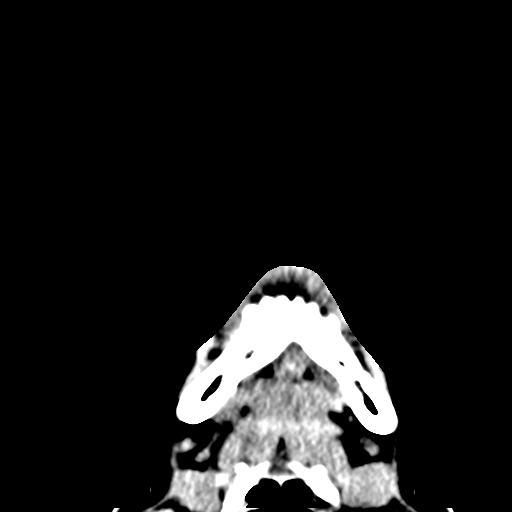
[im 13/87  bone]
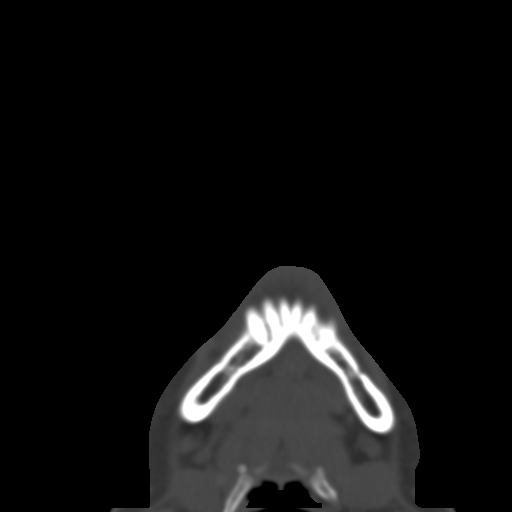
[im 25/87  brain]
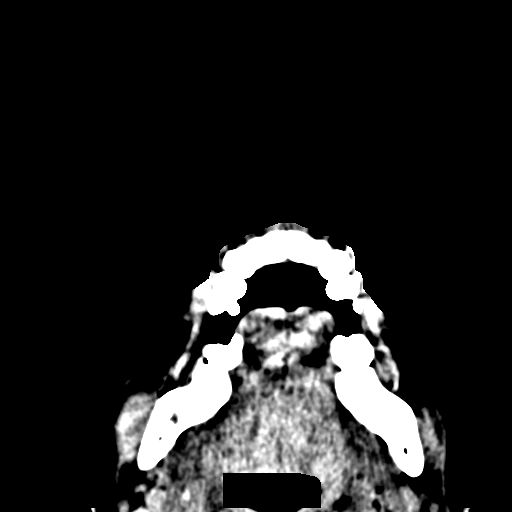
[im 37/87  brain]
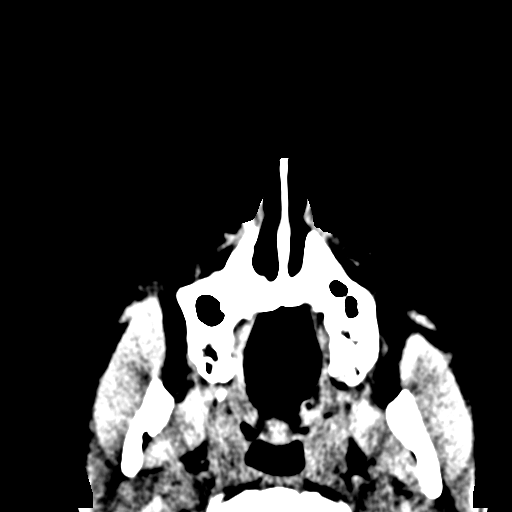
[im 50/87  brain]
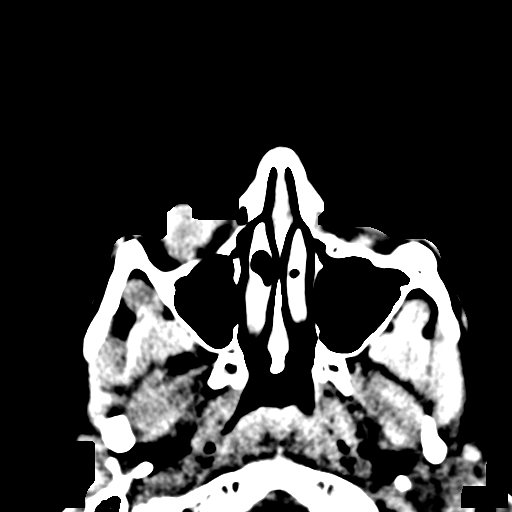
[im 62/87  brain]
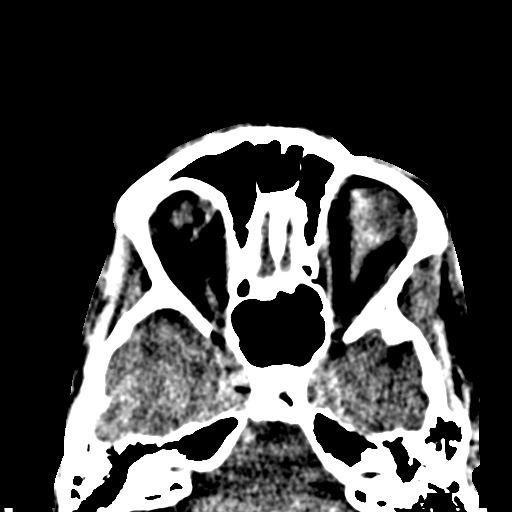
[im 62/87  bone]
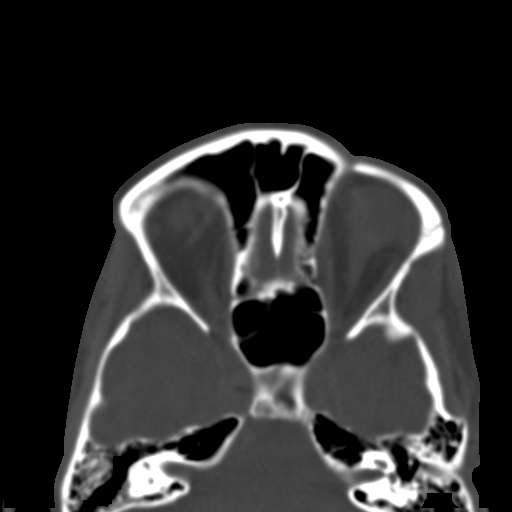
[im 74/87  brain]
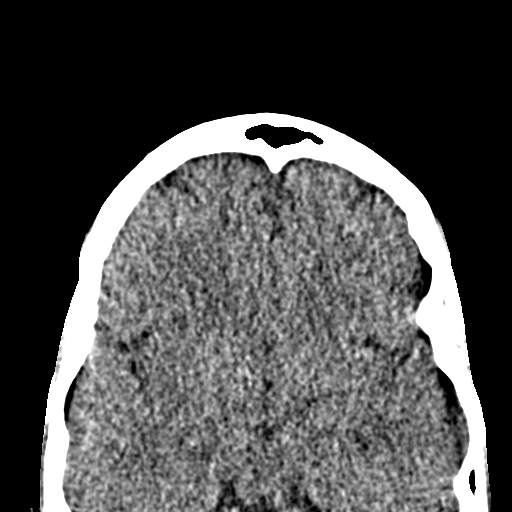

[Series 22: coronal st · coronal · 0.36mm/px · 3 of 76 slices shown]
[im 19/76  brain]
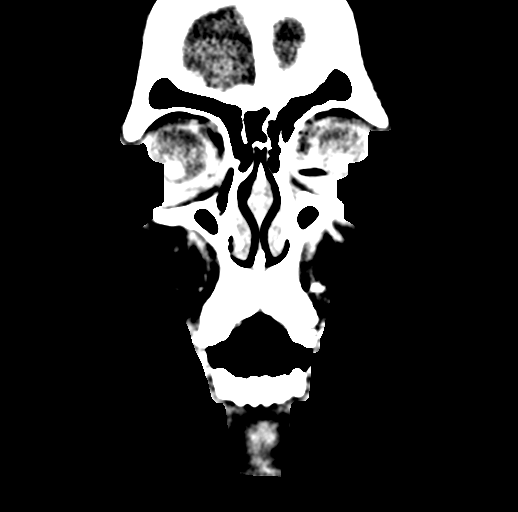
[im 38/76  brain]
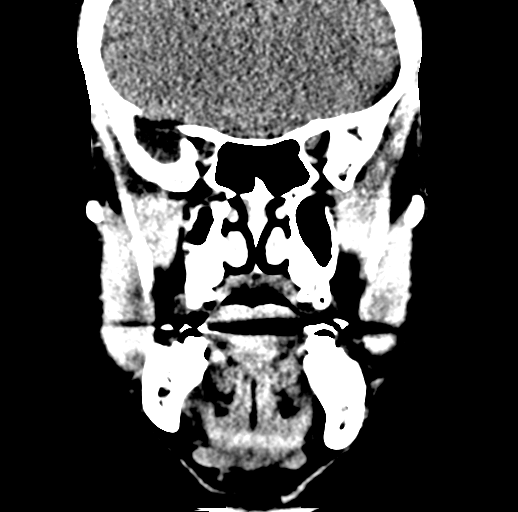
[im 57/76  brain]
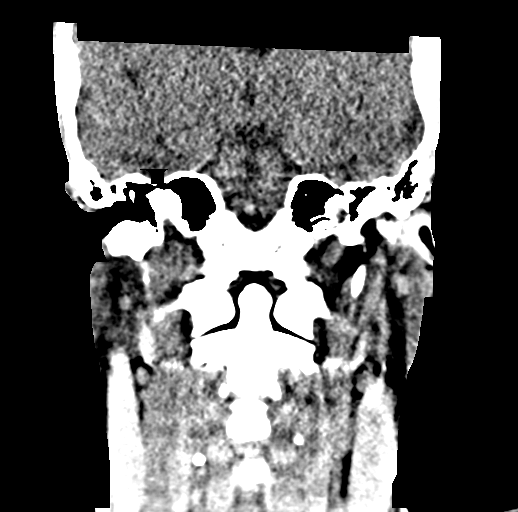

[Series 23: sagittal st · sagittal · 0.36mm/px · 1 of 76 slices shown]
[im 38/76  brain]
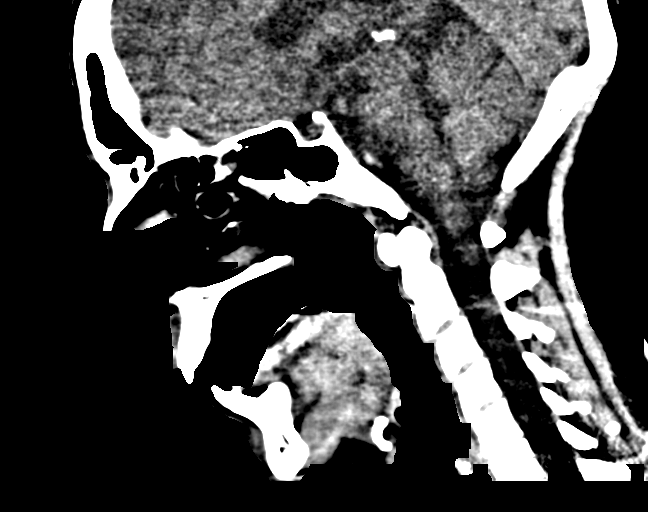

[17 of 47 positions shown; findings below may reference images not displayed]

FINDINGS: CT HEAD FINDINGS

Brain: No evidence of acute infarction, hemorrhage, hydrocephalus,
extra-axial collection or mass lesion/mass effect.

Vascular: No hyperdense vessel or unexpected calcification.

Skull: Normal. Negative for fracture or focal lesion.

Other: None.

CT MAXILLOFACIAL FINDINGS

Osseous: No fracture or mandibular dislocation. No destructive
process. Left periorbital soft tissue swelling.

Orbits: Negative. No traumatic or inflammatory finding.

Sinuses: Clear.

Soft tissues: Negative.

CT CERVICAL SPINE FINDINGS

Alignment: Normal.

Skull base and vertebrae: No acute fracture. No primary bone lesion
or focal pathologic process.

Soft tissues and spinal canal: No prevertebral fluid or swelling. No
visible canal hematoma.

Disc levels:  Unremarkable.

Upper chest: Negative.

Other: None
IMPRESSION: Left periorbital soft tissue swelling without underlying fracture

No acute intracranial process.

No acute maxillofacial fracture.

No acute cervical spine fracture.

## 2016-08-09 MED ORDER — SODIUM CHLORIDE 0.9 % IV BOLUS (SEPSIS)
1000.0000 mL | Freq: Once | INTRAVENOUS | Status: AC
  Administered 2016-08-09: 1000 mL via INTRAVENOUS

## 2016-08-09 MED ORDER — HYDROCODONE-ACETAMINOPHEN 5-325 MG PO TABS: 1.0000 | ORAL_TABLET | Freq: Four times a day (QID) | ORAL | 0 refills | Status: DC | PRN

## 2016-08-09 MED ORDER — NITROFURANTOIN MONOHYD MACRO 100 MG PO CAPS
100.0000 mg | ORAL_CAPSULE | Freq: Once | ORAL | Status: AC
  Administered 2016-08-09: 100 mg via ORAL
  Filled 2016-08-09: qty 1

## 2016-08-09 MED ORDER — NITROFURANTOIN MONOHYD MACRO 100 MG PO CAPS: 100.0000 mg | ORAL_CAPSULE | Freq: Two times a day (BID) | ORAL | 0 refills | Status: AC
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
## 2016-08-09 NOTE — ED Notes (Signed)
CHARGE UPDATED ON PT CURRENT STATUS

## 2016-08-09 NOTE — ED Notes (Signed)
Patient transported to X-ray 

## 2016-08-09 NOTE — ED Notes (Signed)
ED Provider at bedside. SPEAKING WITH MOTHER

## 2016-08-09 NOTE — ED Notes (Signed)
Pt was found with hand full of round red pills with "I2" on them, looked this up and found it to be ibuprofen 200mg  tablets.  9 tablets retrieved and placed in a specimen cup with pt label on it.  Pt states that she took one tablet and that she thought it was a sleepaid.

## 2016-08-09 NOTE — ED Notes (Signed)
[employer] PRESENT SPEAKING WITH PT 

## 2016-08-09 NOTE — ED Notes (Signed)
Patient transported to CT 

## 2016-08-09 NOTE — ED Notes (Addendum)
SECURITY PRESENT. PT AND BELONGINGS CLEARED

## 2016-08-09 NOTE — ED Notes (Signed)
ED Provider at bedside. 

## 2016-08-09 NOTE — ED Notes (Signed)
Brought pt scrubs to dress out into and notified security to wand pt.  Pt has been seen by TTS and tells me that she was told that she was going to be discharged.  Await primary RN decision before pt must dress out

## 2016-08-09 NOTE — ED Notes (Signed)
TTS PRESENT 

## 2016-08-09 NOTE — ED Notes (Signed)
Tiffany Whitehead-MOM-CONTACT INFORMATION [phone]
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
## 2016-08-09 NOTE — ED Notes (Signed)
Pt states she is declining psychiatric treatment at this time

## 2016-08-09 NOTE — ED Notes (Addendum)
EDP AND TTS MADE AWARE OF MEDICATION FOUND IN PT'S BED. PT STATING MOM GAVE IT TO HER. PT STATING IT WAS HER SLEEPING MEDICATION AND THAT SHE BROUGHT IT WITH HER TO HELP HER SLEEP. PT DENIED TAKING ANY MEDICATION RECENTLY. PT STATED SHE TOOK SEVERAL LAST NIGHT. PT DOES NOT RECALL NAME OF MEDICATION. MOM DENIED GIVING MEDICATION TO PT. NO ORDERS GIVEN.
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
## 2016-08-09 NOTE — ED Notes (Signed)
Pt offered meal. Declined at this time

## 2016-08-09 NOTE — ED Notes (Signed)
Ordered lunch tray 

## 2016-08-28 IMAGING — US US RENAL
1 series · 14 of 25 positions shown · non-contrast
Comparison: [DATE], CT [DATE]

CLINICAL DATA: Right back and flank pain with elevated white count,
26 weeks pregnant

EXAM:
RENAL / URINARY TRACT ULTRASOUND COMPLETE

[Series 1: us renal · 0.28mm/px · 14 of 44 slices shown]
[im 1/44]
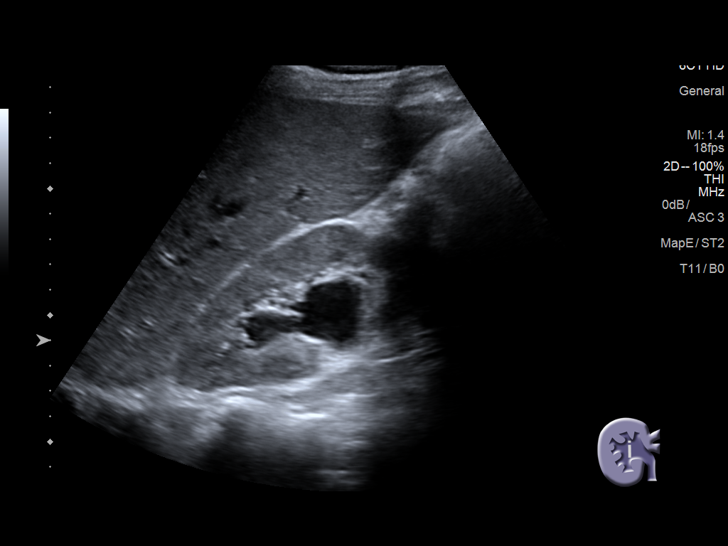
[im 4/44]
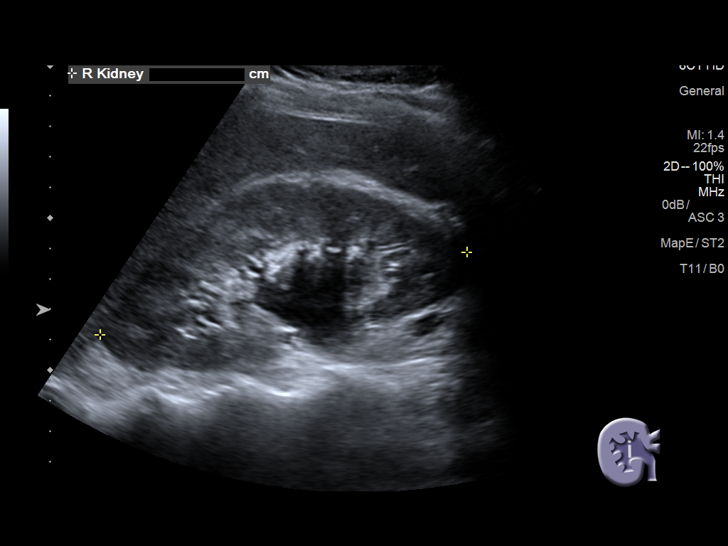
[im 8/44]
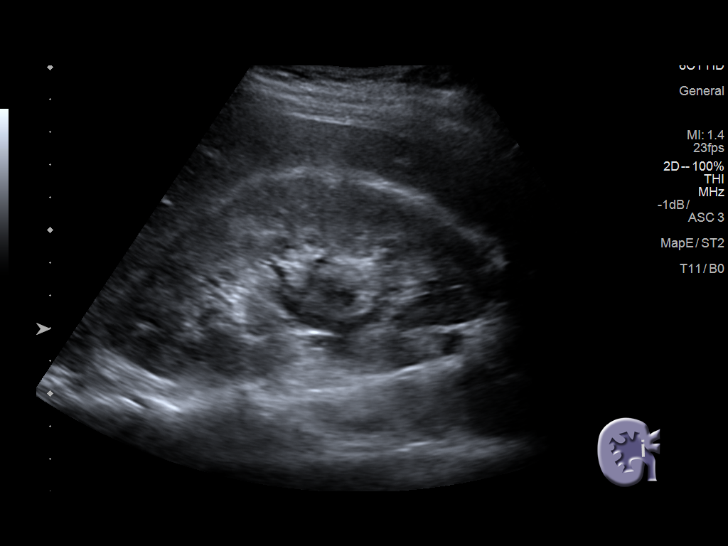
[im 11/44]
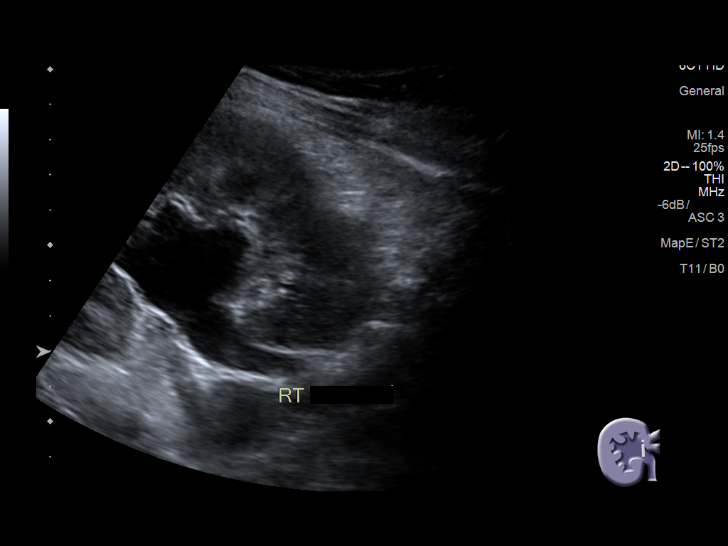
[im 15/44]
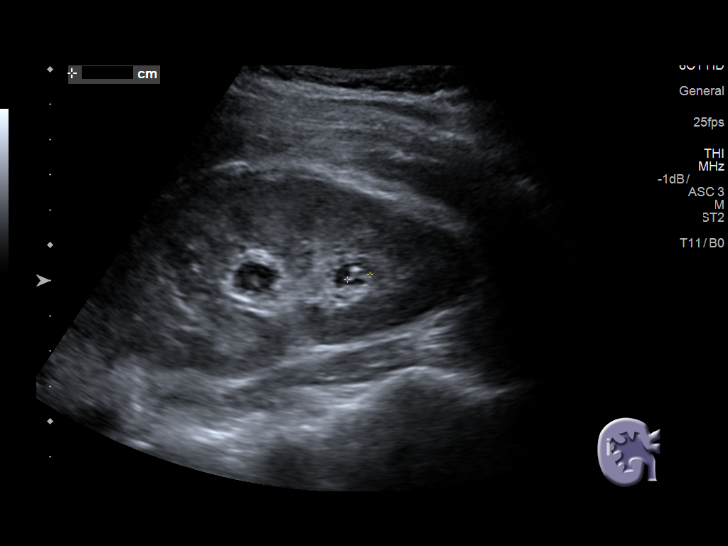
[im 17/44]
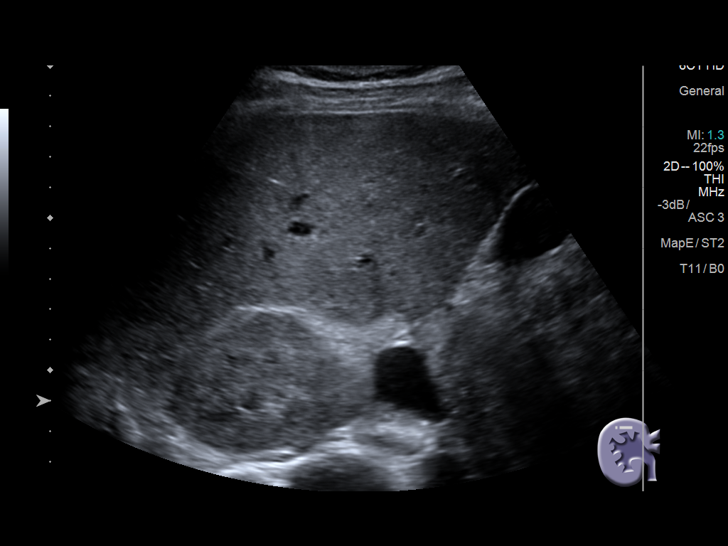
[im 20/44]
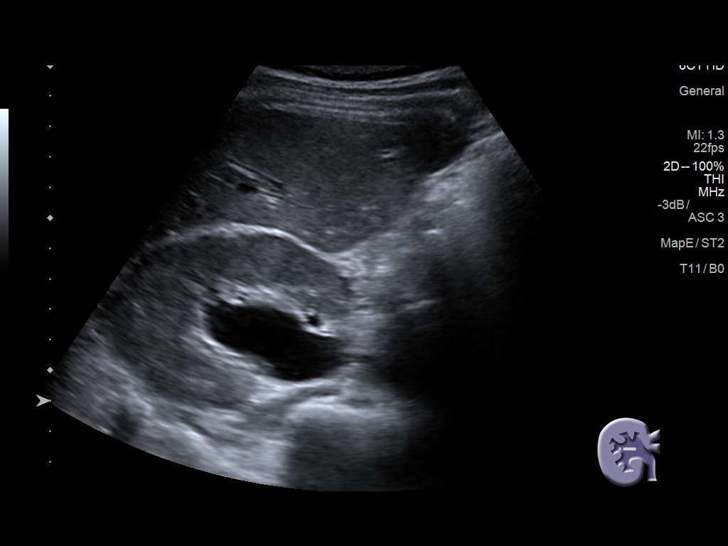
[im 24/44]
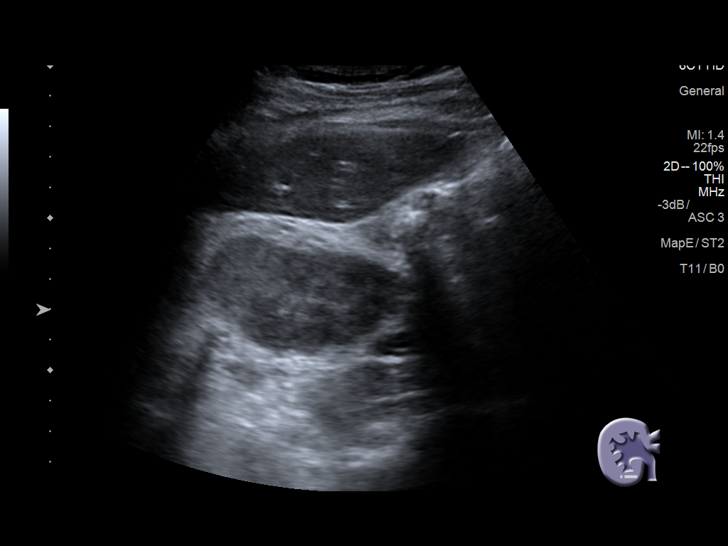
[im 27/44]
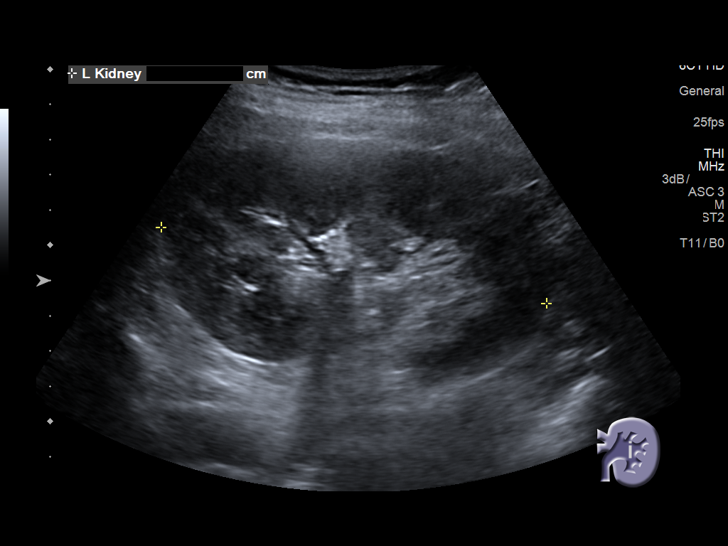
[im 29/44]
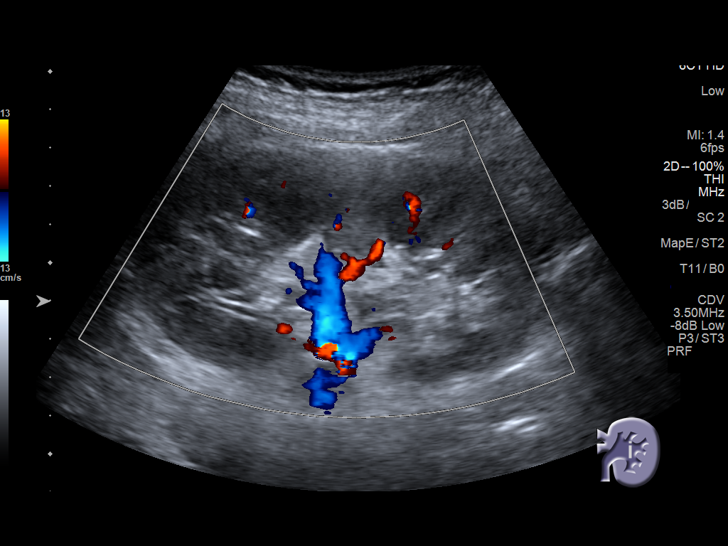
[im 33/44]
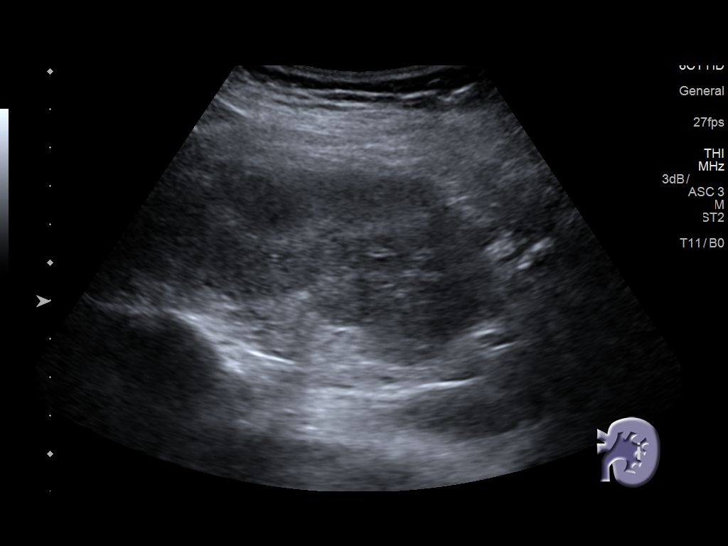
[im 36/44]
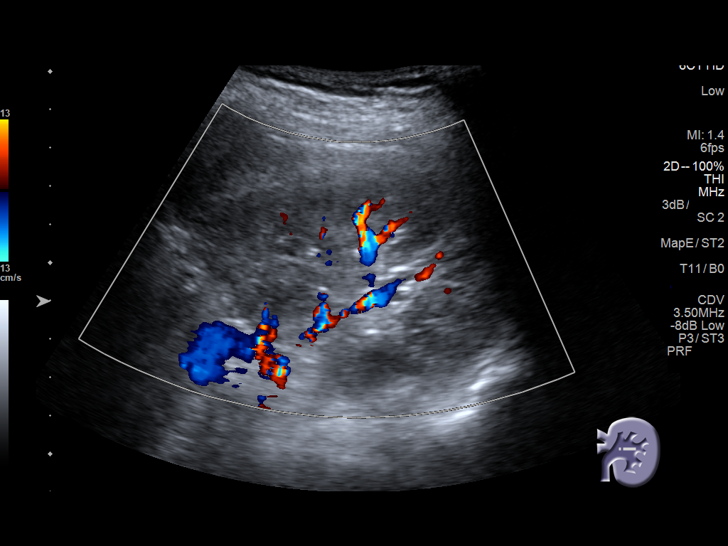
[im 40/44]
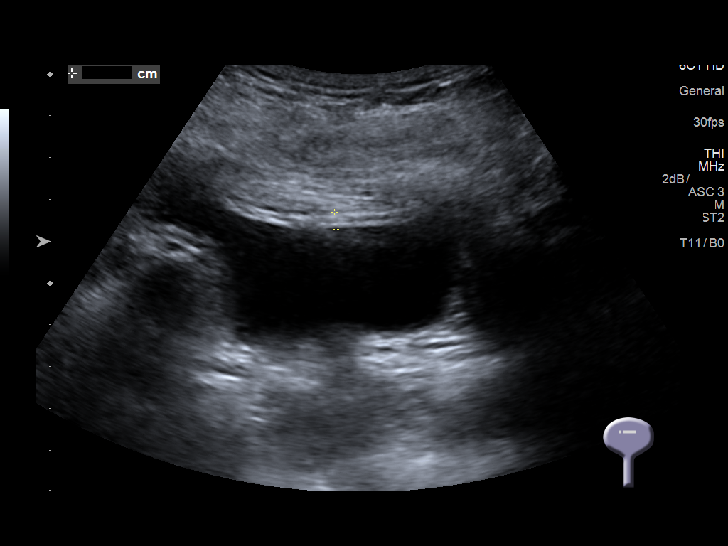
[im 44/44]
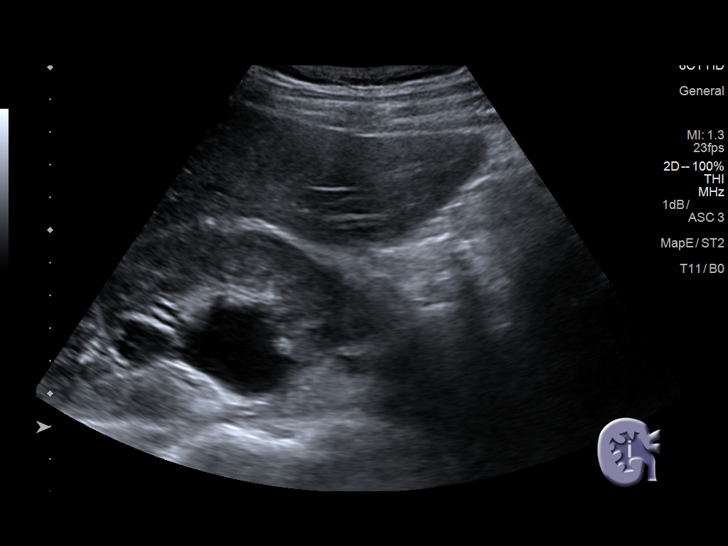

[14 of 25 positions shown; findings below may reference images not displayed]

FINDINGS: Right Kidney:

Length: 12.3 cm. Cortical echogenicity within normal limits.
Moderate right hydronephrosis and dilatation of the proximal ureter.
Possible 7 mm stone in the lower pole of the right kidney.

Left Kidney:

Length: 11.1 cm. Echogenicity within normal limits. No mass or
hydronephrosis visualized.

Bladder:

Appears normal for degree of bladder distention. Bilateral jets are
visualized
IMPRESSION: 1. Moderate right hydronephrosis and proximal hydroureter. Possible
7 mm stone in the lower pole of the right kidney
2. Normal left kidney

## 2016-10-18 IMAGING — US US RENAL
1 series · 14 of 25 positions shown · non-contrast
Comparison: Ultrasound [DATE]

CLINICAL DATA: Left flank pain 34 week pregnant patient

EXAM:
RENAL / URINARY TRACT ULTRASOUND COMPLETE

[Series 1: us renal · 0.23mm/px · 14 of 50 slices shown]
[im 1/50]
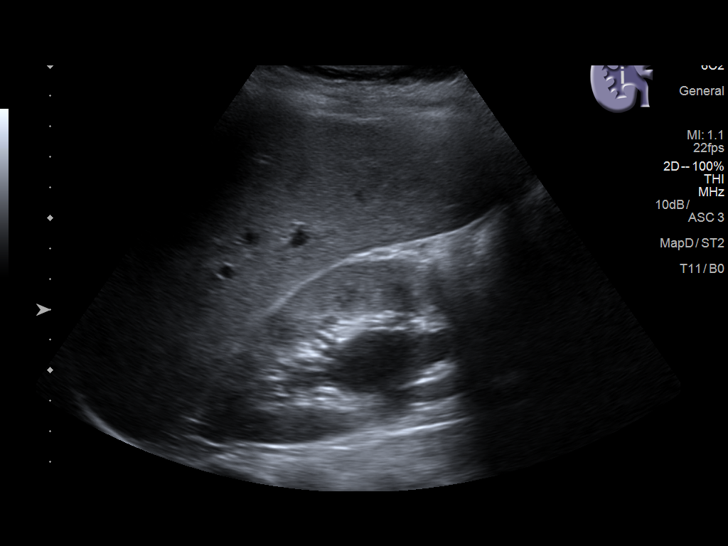
[im 5/50]
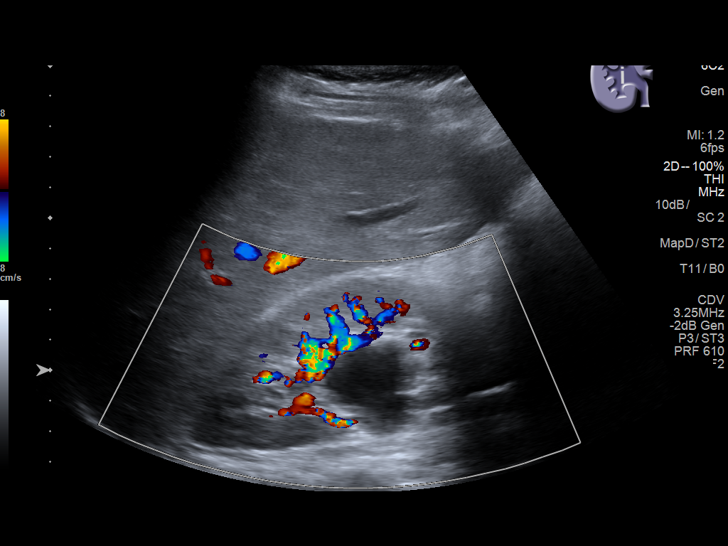
[im 9/50]
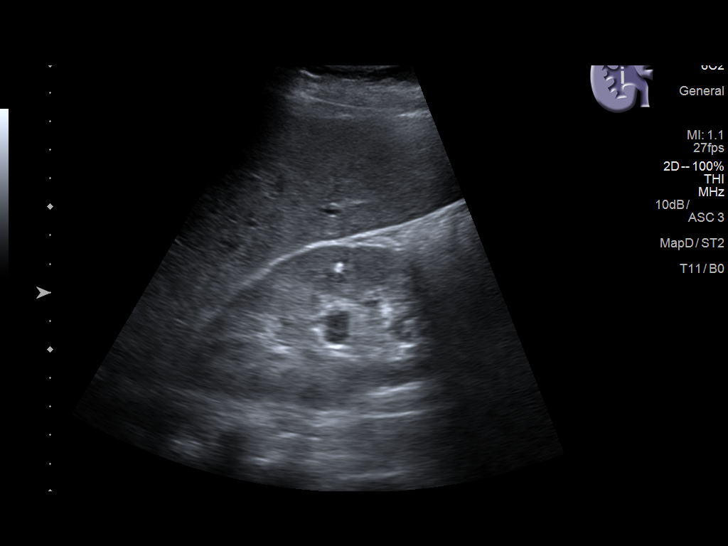
[im 13/50]
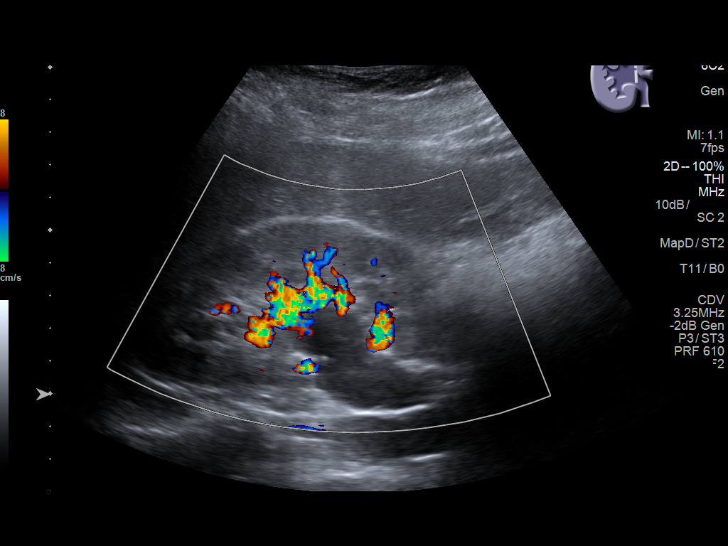
[im 17/50]
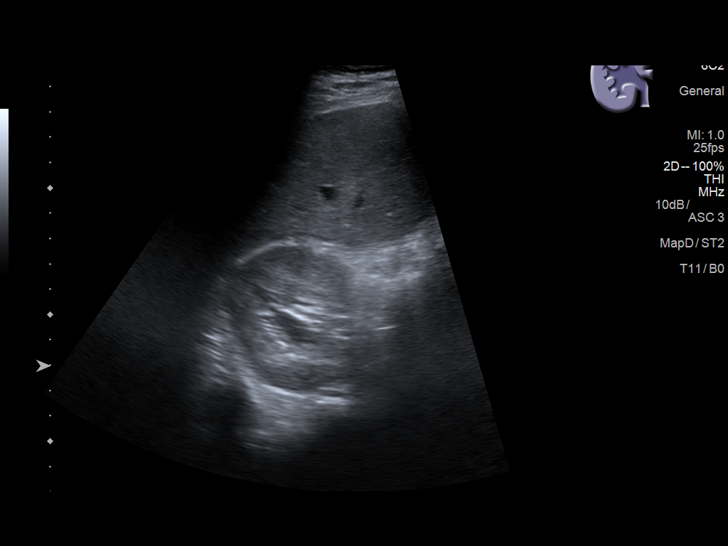
[im 19/50]
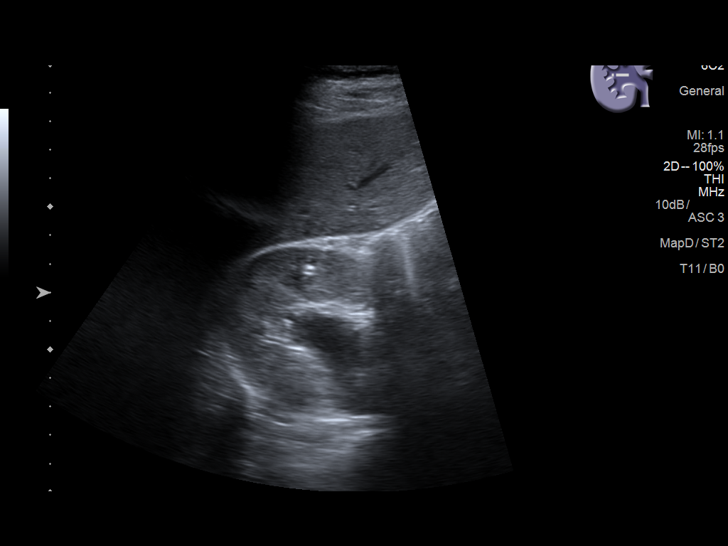
[im 23/50]
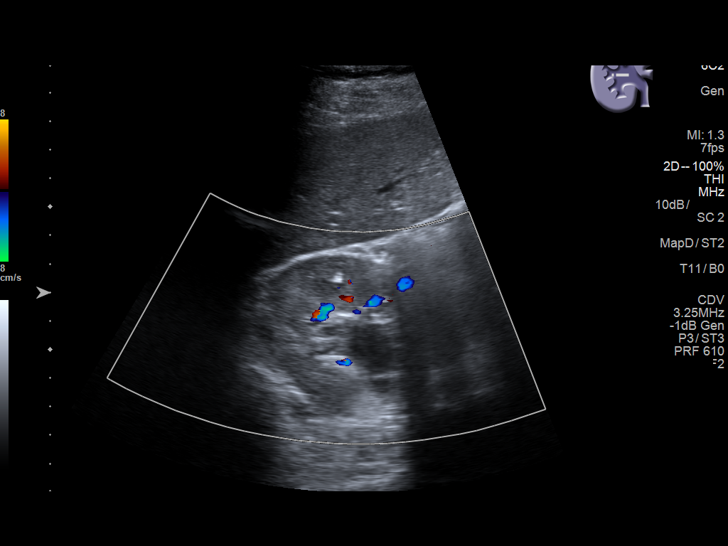
[im 27/50]
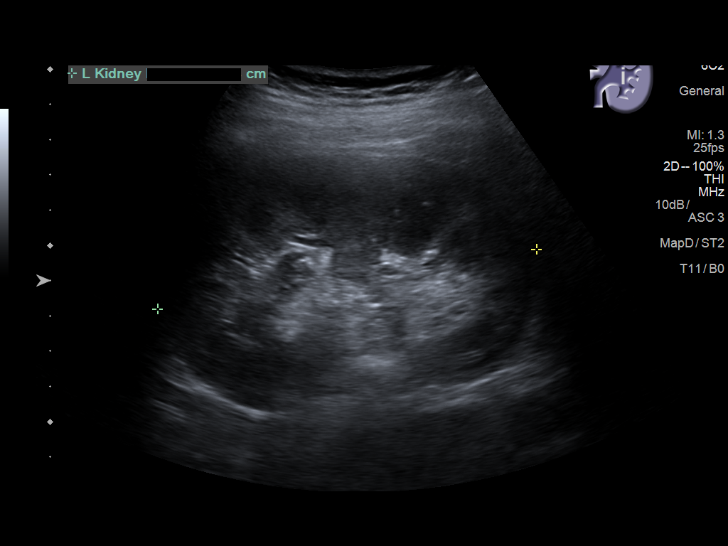
[im 31/50]
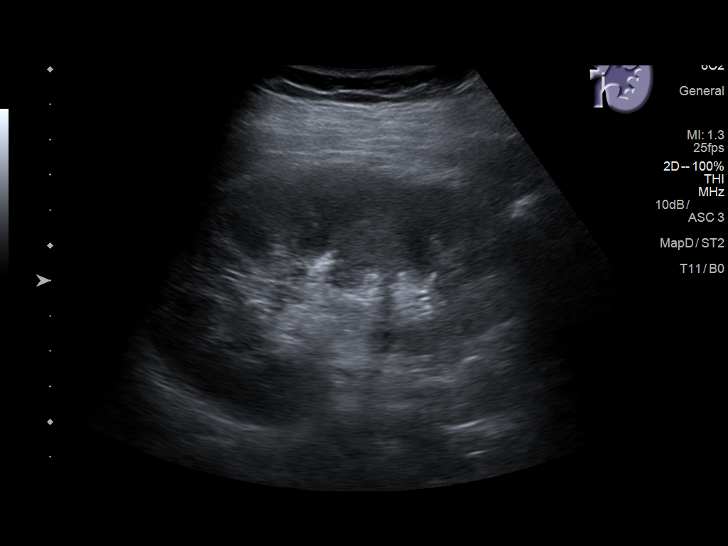
[im 33/50]
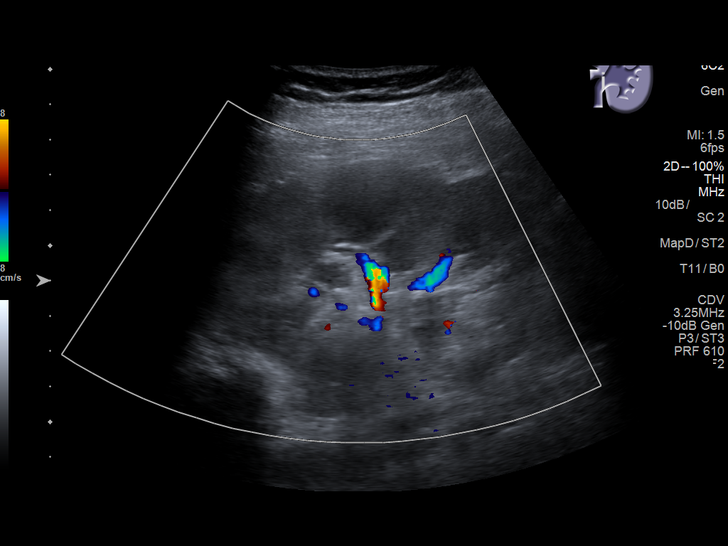
[im 37/50]
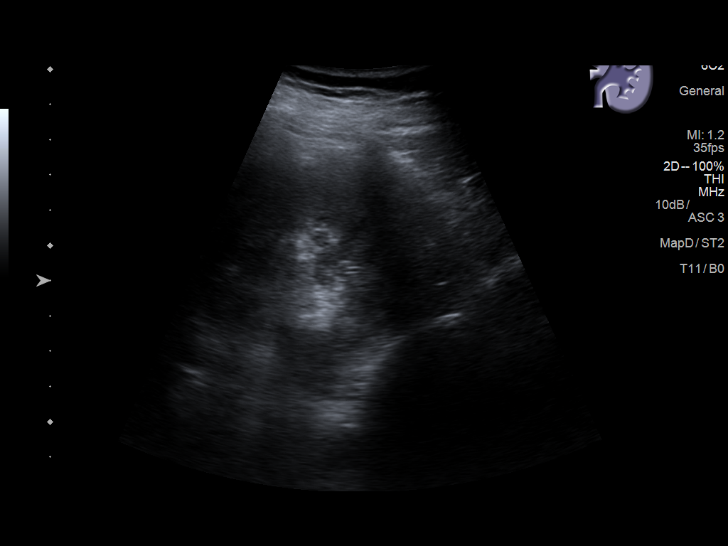
[im 41/50]
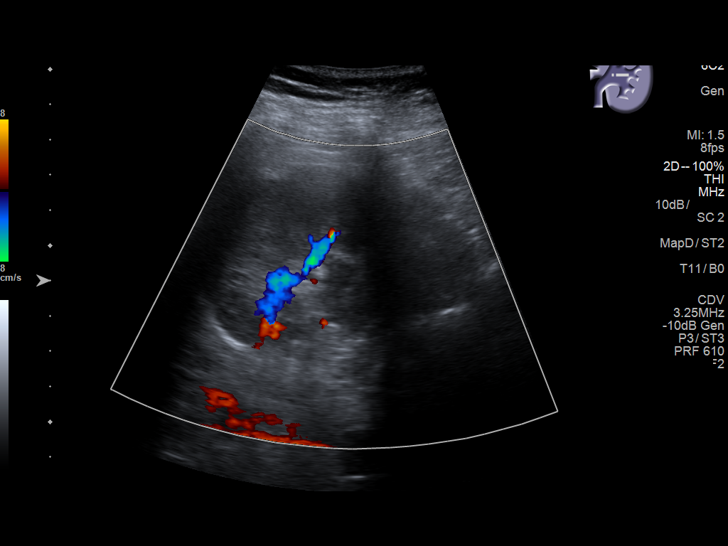
[im 45/50]
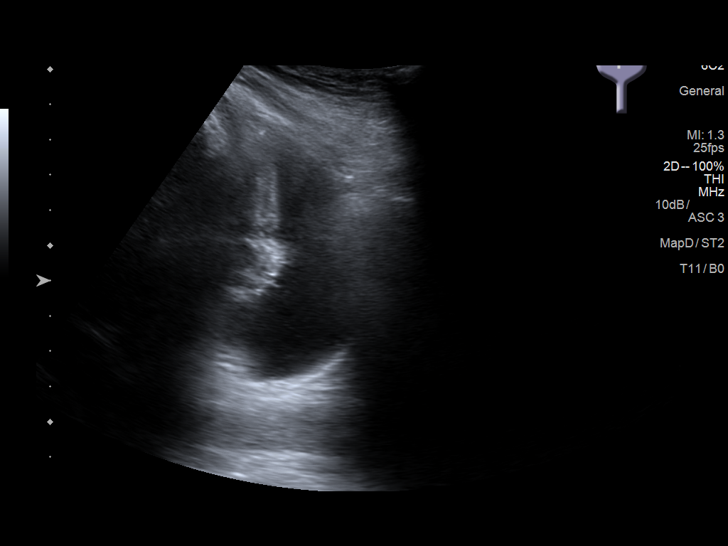
[im 50/50]
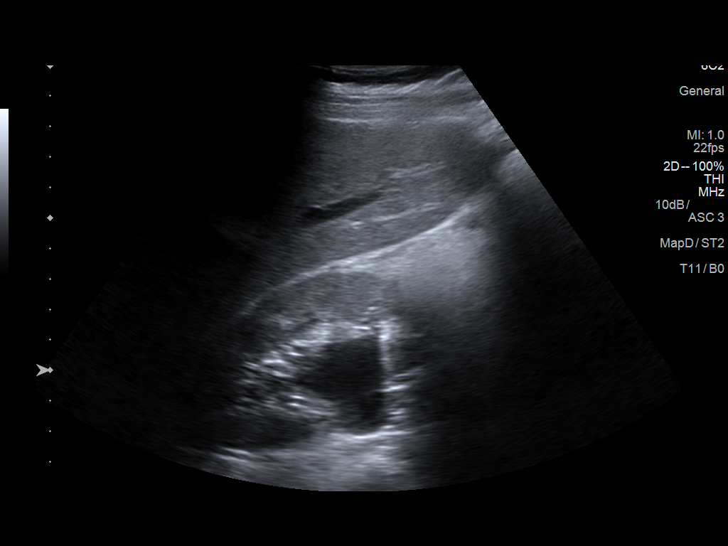

[14 of 25 positions shown; findings below may reference images not displayed]

FINDINGS: Right Kidney:

Length: 12.2 cm. Cortical echogenicity within normal limits.
Moderate right hydronephrosis, unchanged. Proximal hydroureter.
Possible 5 mm echogenic stone mid pole right kidney.

Left Kidney:

Length: 10.9 cm. Cortical echogenicity within normal limits. Interim
finding of mild left hydronephrosis, most prominent at the lower
pole.

Bladder:

Appears normal for degree of bladder distention.
IMPRESSION: 1. No significant interval change in moderate right hydronephrosis
and proximal hydroureter. Possible 5 mm stone mid right kidney
2. Interim finding of mild left hydronephrosis

## 2017-02-08 ENCOUNTER — Encounter: Payer: Self-pay | Admitting: *Deleted

## 2017-02-08 ENCOUNTER — Emergency Department: Payer: BLUE CROSS/BLUE SHIELD

## 2017-02-08 ENCOUNTER — Emergency Department
Admission: EM | Admit: 2017-02-08 | Discharge: 2017-02-08 | Disposition: A | Discharge status: Home / Self Care | Payer: BLUE CROSS/BLUE SHIELD | Attending: Emergency Medicine | Admitting: Emergency Medicine

## 2017-02-08 DIAGNOSIS — S6992XA Unspecified injury of left wrist, hand and finger(s), initial encounter: Secondary | ICD-10-CM | POA: Diagnosis present

## 2017-02-08 DIAGNOSIS — Y93E9 Activity, other interior property and clothing maintenance: Secondary | ICD-10-CM | POA: Insufficient documentation

## 2017-02-08 DIAGNOSIS — Y999 Unspecified external cause status: Secondary | ICD-10-CM | POA: Insufficient documentation

## 2017-02-08 DIAGNOSIS — S62645A Nondisplaced fracture of proximal phalanx of left ring finger, initial encounter for closed fracture: Secondary | ICD-10-CM | POA: Insufficient documentation

## 2017-02-08 DIAGNOSIS — Z79899 Other long term (current) drug therapy: Secondary | ICD-10-CM | POA: Diagnosis not present

## 2017-02-08 DIAGNOSIS — W010XXA Fall on same level from slipping, tripping and stumbling without subsequent striking against object, initial encounter: Secondary | ICD-10-CM | POA: Diagnosis not present

## 2017-02-08 DIAGNOSIS — M79642 Pain in left hand: Secondary | ICD-10-CM

## 2017-02-08 DIAGNOSIS — Y92009 Unspecified place in unspecified non-institutional (private) residence as the place of occurrence of the external cause: Secondary | ICD-10-CM | POA: Diagnosis not present

## 2017-02-08 IMAGING — DX DG WRIST COMPLETE 3+V*R*
4 series · 4 of 4 positions shown · non-contrast
Comparison: None.

CLINICAL DATA: Fall on porch this morning. Pain, swelling, and
bruising in the wrist.

EXAM:
RIGHT WRIST - COMPLETE 3+ VIEW

[wrist ap (1 of 2)]
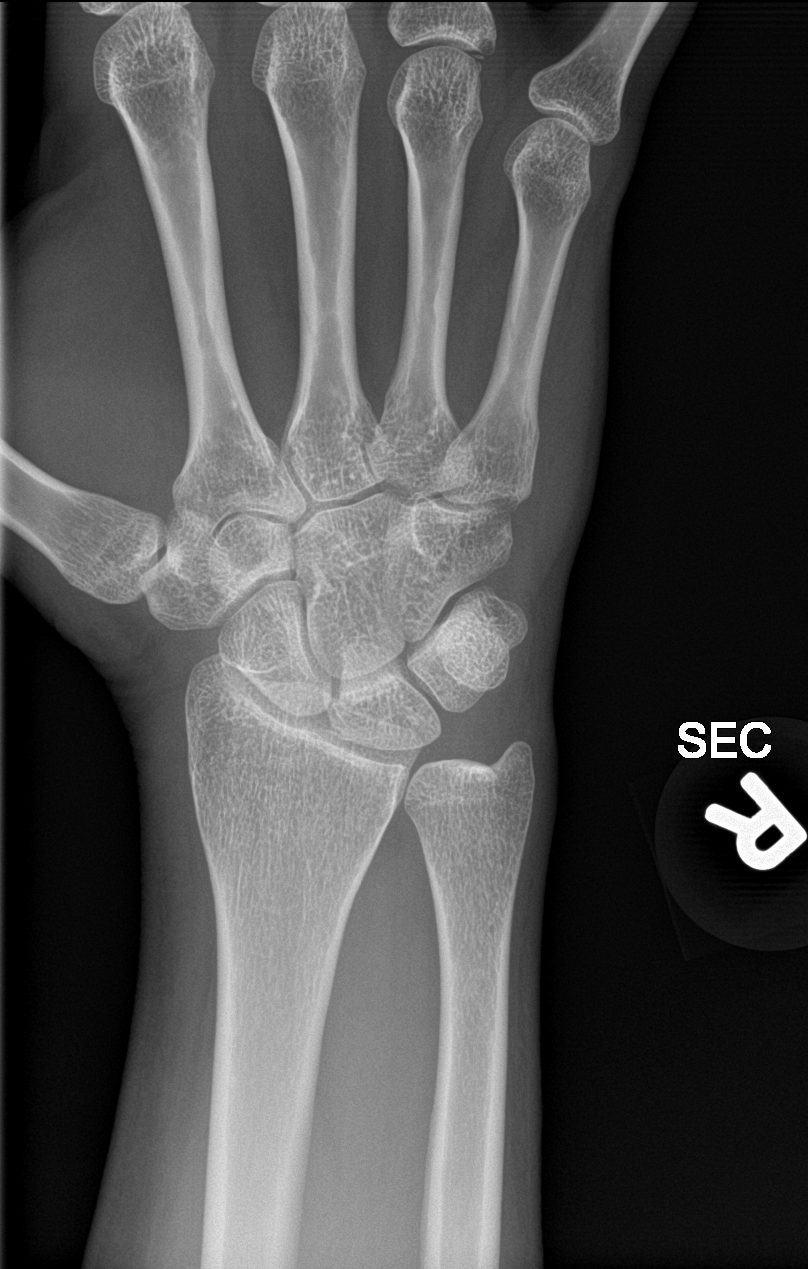

[wrist obl]
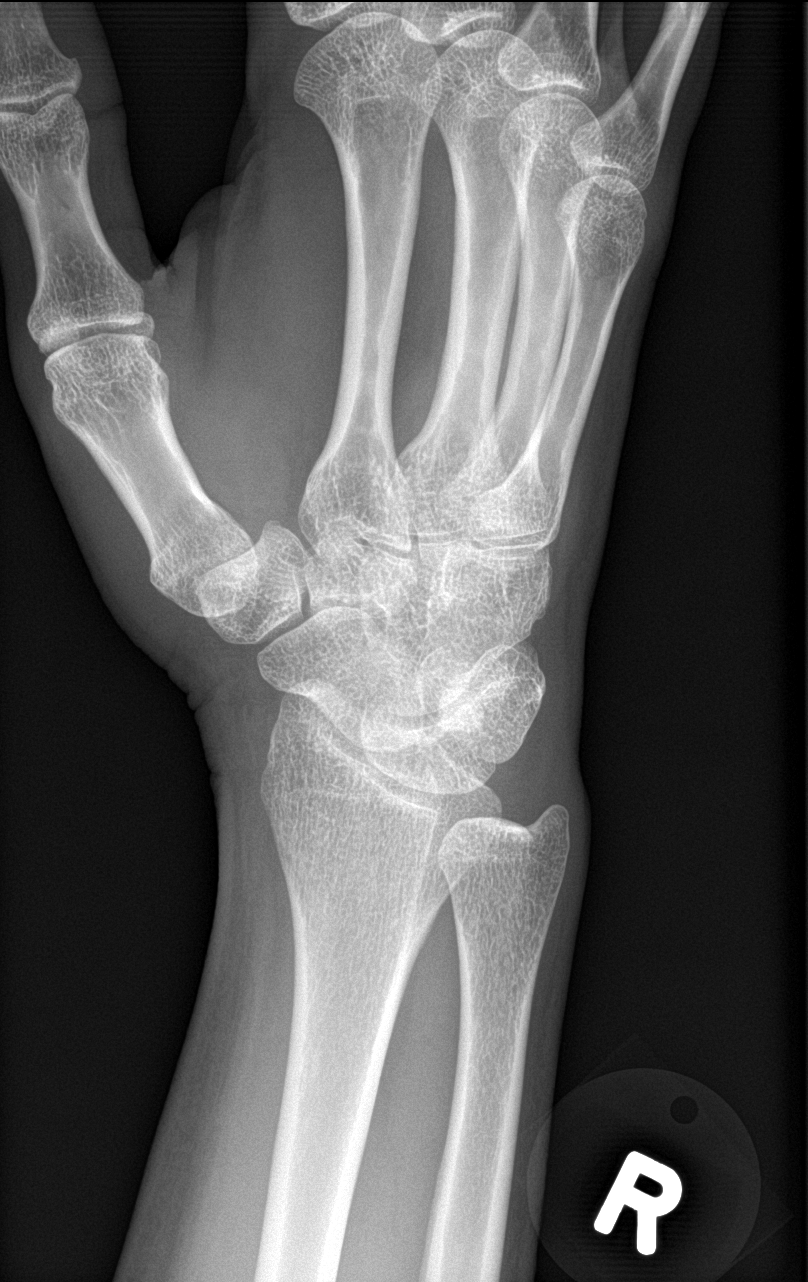

[wrist lat]
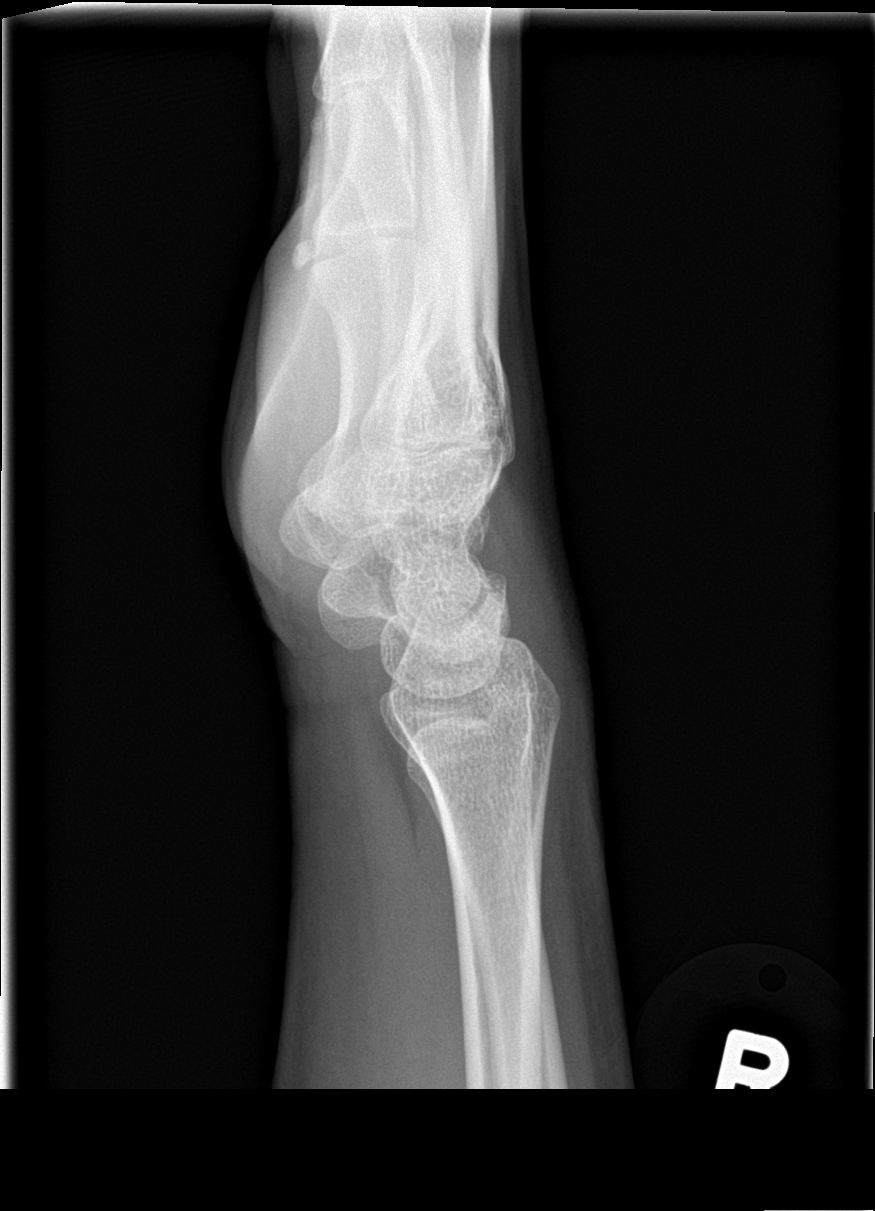

[wrist ap (2 of 2)]
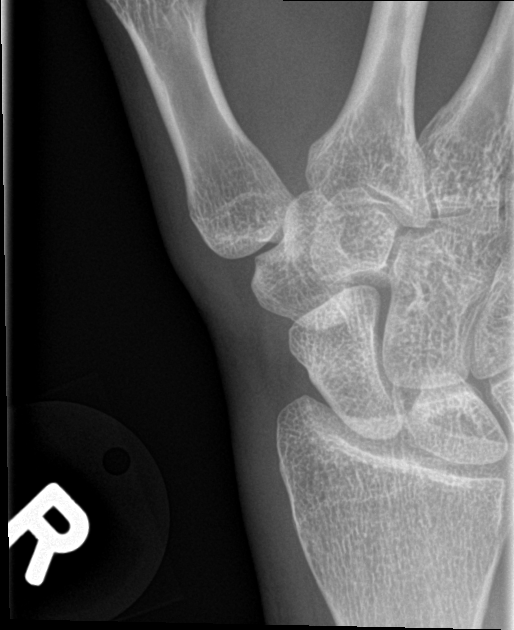

[4 of 4 positions shown; findings below may reference images not displayed]

FINDINGS: Faint linear calcification along the ulnar side of the base of the
proximal phalanx of the ring finger, probably a chronic small
ossicle given the somewhat well corticated appearance and lack of
reported tenderness in this vicinity.

No fracture involving the wrist is identified.
IMPRESSION: 1. No acute bony findings.
2. Probable chronic small ossicle along the ulnar side of the ring
finger MCP joint.

## 2017-02-08 MED ORDER — TRAMADOL HCL 50 MG PO TABS: 50.0000 mg | ORAL_TABLET | Freq: Four times a day (QID) | ORAL | 0 refills | Status: DC | PRN

## 2017-02-08 MED ORDER — OXYCODONE-ACETAMINOPHEN 5-325 MG PO TABS
1.0000 | ORAL_TABLET | Freq: Once | ORAL | Status: AC
  Administered 2017-02-08: 1 via ORAL
  Filled 2017-02-08: qty 1

## 2017-02-08 NOTE — ED Notes (Signed)
See triage note states she fell   Landed on right hand arm  Having pain and swelling to lateral wrist area  Good pulses

## 2017-02-08 NOTE — ED Triage Notes (Signed)
Pt tripped and fell while taking out the trash, pt injured right wrist, right wrist is swollen

## 2017-02-08 NOTE — ED Provider Notes (Signed)
[facility] Emergency Department Provider Note  ____________________________________________  Time seen: Approximately 2:08 PM  I have reviewed the triage vital signs and the nursing notes.   HISTORY  Chief Complaint Wrist Pain    HPI Tiffany Whitehead is a 26 y.o. female that presents to the emergency department for evaluation of left wrist and hand pain after falling while taking the trash out at 6am this morning. She thought the pain would get better but she has continued to be painful over the little finger side of her hand and wrist. Pain is primarily when she bends her wrist. She did not hit her head or lose consciousness. No alleviating measures have been attempted. No numbness or tingling.   History reviewed. No pertinent past medical history.  There are no active problems to display for this patient.   Past Surgical History:  Procedure Laterality Date  . extraction of wisdom teeth      Prior to Admission medications   Medication Sig Start Date End Date Taking? Authorizing Provider  lisdexamfetamine (VYVANSE) 30 MG capsule Take 30 mg by mouth daily.   Yes [provider]  dicyclomine (BENTYL) 20 MG tablet Take 1 tablet (20 mg total) by mouth 4 (four) times daily -  before meals and at bedtime. Patient not taking: Reported on 08/09/2016 04/02/15   Devoria Albe, MD  drospirenone-ethinyl estradiol Pierre Bali) 3-0.02 MG tablet Take 1 tablet by mouth daily.    [provider]  HYDROcodone-acetaminophen (NORCO/VICODIN) 5-325 MG tablet Take 1 tablet by mouth every 6 (six) hours as needed. 08/09/16   Tegeler, Canary Brim, MD  LORazepam (ATIVAN) 1 MG tablet Take 1 tablet (1 mg total) by mouth 3 (three) times daily as needed for anxiety. Patient not taking: Reported on 08/30/2014 03/20/13   Jaynie Crumble, PA-C  ondansetron (ZOFRAN) 4 MG tablet Take 1 tablet (4 mg total) by mouth every 6 (six) hours. Patient not taking: Reported on  08/09/2016 04/02/15   Devoria Albe, MD  traMADol (ULTRAM) 50 MG tablet Take 1 tablet (50 mg total) by mouth every 6 (six) hours as needed. 02/08/17 02/08/18  Enid Derry, PA-C    Allergies Ceclor [cefaclor]  Family History  Problem Relation Age of Onset  . Cancer Mother   . Cancer Other     Social History Social History  Substance Use Topics  . Smoking status: Never Smoker  . Smokeless tobacco: Never Used  . Alcohol use Yes     Comment: social     Review of Systems  Constitutional: No fever/chills Cardiovascular: No chest pain. Respiratory: No SOB. Gastrointestinal: No abdominal pain.  No nausea, no vomiting.  Musculoskeletal: Positive for hand and wrist pain. Skin: Negative for rash, abrasions, lacerations, ecchymosis. Neurological: Negative for headaches, numbness or tingling   ____________________________________________   PHYSICAL EXAM:  VITAL SIGNS: ED Triage Vitals  Enc Vitals Group     BP 02/08/17 1231 (!) 162/90     Pulse Rate 02/08/17 1231 81     Resp 02/08/17 1231 18     Temp 02/08/17 1231 98.5 F (36.9 C)     Temp Source 02/08/17 1231 Oral     SpO2 02/08/17 1231 98 %     Weight 02/08/17 1230 140 lb (63.5 kg)     Height 02/08/17 1230  (1.727 m)     Head Circumference --      Peak Flow --      Pain Score 02/08/17 1229 10     Pain  Loc --      Pain Edu? --      Excl. in GC? --      Constitutional: Alert and oriented. Well appearing and in no acute distress. Eyes: Conjunctivae are normal. PERRL. EOMI. Head: Atraumatic. ENT:      Ears:      Nose: No congestion/rhinnorhea.      Mouth/Throat: Mucous membranes are moist.  Neck: No stridor.    Cardiovascular: Normal rate, regular rhythm.  Good peripheral circulation. 2+ radial pulses. Respiratory: Normal respiratory effort without tachypnea or retractions. Lungs CTAB. Good air entry to the bases with no decreased or absent breath sounds. Musculoskeletal: No gross deformities appreciated.  Extremity tenderness to palpation over left fourth and fifth metacarpal and over the ulnar side of wrist. No visible swelling. Neurologic:  Normal speech and language. No gross focal neurologic deficits are appreciated.  Skin:  Skin is warm, dry and intact. No rash noted.   ____________________________________________   LABS (all labs ordered are listed, but only abnormal results are displayed)  Labs Reviewed - No data to display ____________________________________________  EKG   ____________________________________________  RADIOLOGY Lexine Baton, personally viewed and evaluated these images (plain radiographs) as part of my medical decision making, as well as reviewing the written report by the radiologist.  Dg Wrist Complete Right  Result Date: 02/08/2017 CLINICAL DATA:  Fall on porch this morning. Pain, swelling, and bruising in the wrist. EXAM: RIGHT WRIST - COMPLETE 3+ VIEW COMPARISON:  None. FINDINGS: Faint linear calcification along the ulnar side of the base of the proximal phalanx of the ring finger, probably a chronic small ossicle given the somewhat well corticated appearance and lack of reported tenderness in this vicinity. No fracture involving the wrist is identified. IMPRESSION: 1. No acute bony findings. 2. 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 Electronically Signed   By: Gaylyn Rong M.D.   On: 02/08/2017 13:42    ____________________________________________    PROCEDURES  Procedure(s) performed:    Procedures    Medications  oxyCODONE-acetaminophen (PERCOCET/ROXICET) 5-325 MG per tablet 1 tablet (1 tablet Oral Given 02/08/17 1440)     ____________________________________________   INITIAL IMPRESSION / ASSESSMENT AND PLAN / ED COURSE  Pertinent labs & imaging results that were available during my care of the patient were reviewed by me and considered in my medical decision making (see chart for  details).  Review of the [place] CSRS was performed in accordance of the [facility] prior to dispensing any controlled drugs.  Patient presented to the emergency department for evaluation of left wrist pain after fall. Vital signs and exam are reassuring. X-ray indicates probable ossicle at proximal fourth phalanx. Patient primarily has tenderness to palpation over fifth metacarpal but also has tenderness here so ulnar gutter splint was placed. Hand is neurovascularly intact. Patient will be discharged home with prescriptions for a short course of tramadol. Patient is to follow up with orthopedics as directed. Patient is given ED precautions to return to the ED for any worsening or new symptoms.     ____________________________________________  FINAL CLINICAL IMPRESSION(S) / ED DIAGNOSES  Final diagnoses:  Closed nondisplaced fracture of proximal phalanx of left ring finger, initial encounter  Pain of left hand      NEW MEDICATIONS STARTED DURING THIS VISIT:  Discharge Medication List as of 02/08/2017  3:25 PM    START taking these medications   Details  traMADol (ULTRAM) 50 MG tablet Take 1 tablet (  50 mg total) by mouth every 6 (six) hours as needed., Starting Mon 02/08/2017, Until Tue 02/08/2018, Print            This chart was dictated using voice recognition software/Dragon. Despite best efforts to proofread, errors can occur which can change the meaning. Any change was purely unintentional.    Enid Derry, PA-C 02/08/17 1545    Pershing Proud, Myra Rude, MD 02/09/17 [phone]

## 2017-02-10 ENCOUNTER — Encounter: Payer: Self-pay | Admitting: *Deleted

## 2017-02-10 ENCOUNTER — Emergency Department
Admission: EM | Admit: 2017-02-10 | Discharge: 2017-02-10 | Disposition: A | Discharge status: Home / Self Care | Payer: BLUE CROSS/BLUE SHIELD | Attending: Emergency Medicine | Admitting: Emergency Medicine

## 2017-02-10 DIAGNOSIS — S62344D Nondisplaced fracture of base of fourth metacarpal bone, right hand, subsequent encounter for fracture with routine healing: Secondary | ICD-10-CM | POA: Insufficient documentation

## 2017-02-10 DIAGNOSIS — Z4689 Encounter for fitting and adjustment of other specified devices: Secondary | ICD-10-CM | POA: Diagnosis present

## 2017-02-10 DIAGNOSIS — X58XXXD Exposure to other specified factors, subsequent encounter: Secondary | ICD-10-CM | POA: Insufficient documentation

## 2017-02-10 DIAGNOSIS — Z79899 Other long term (current) drug therapy: Secondary | ICD-10-CM | POA: Diagnosis not present

## 2017-02-10 NOTE — ED Triage Notes (Signed)
Pt has a fx to right hand.  Pt needs ocl splint reapplied.  Pt alert.

## 2017-02-10 NOTE — ED Notes (Addendum)
Pt reports arm with splint fell in toilet and here to have new splint placed.  Has removed old splint and dad tried to put back on but was too tight and nurse line told her to come get it done here.  No deformity noted. Fingers cold because has on ice pack when RN entered room.

## 2017-02-10 NOTE — ED Provider Notes (Signed)
[facility] Emergency Department Provider Note  ____________________________________________  Time seen: Approximately 6:24 PM  I have reviewed the triage vital signs and the nursing notes.   HISTORY  Chief Complaint Hand Pain    HPI Tiffany Whitehead is a 26 y.o. female ho presents emergency department for repeat splint. Patient was seen in this department 2 days prior with right hand injury. X-ray revealed cortical disruption in the base of the fourth metacarpal bone. Patient's hand was splinted using ulnar gutter splint. Today, the patient's splint became wet and she had to remove same as it became too tight on her hand. Patient denies any repeat injury she is her repeat splint only. No other complaints at this time.   No past medical history on file.  There are no active problems to display for this patient.   Past Surgical History:  Procedure Laterality Date  . extraction of wisdom teeth      Prior to Admission medications   Medication Sig Start Date End Date Taking? Authorizing Provider  dicyclomine (BENTYL) 20 MG tablet Take 1 tablet (20 mg total) by mouth 4 (four) times daily -  before meals and at bedtime. Patient not taking: Reported on 08/09/2016 04/02/15   Devoria Albe, MD  drospirenone-ethinyl estradiol Pierre Bali) 3-0.02 MG tablet Take 1 tablet by mouth daily.    [provider]  HYDROcodone-acetaminophen (NORCO/VICODIN) 5-325 MG tablet Take 1 tablet by mouth every 6 (six) hours as needed. 08/09/16   Tegeler, Canary Brim, MD  lisdexamfetamine (VYVANSE) 30 MG capsule Take 30 mg by mouth daily.    [provider]  LORazepam (ATIVAN) 1 MG tablet Take 1 tablet (1 mg total) by mouth 3 (three) times daily as needed for anxiety. Patient not taking: Reported on 08/30/2014 03/20/13   Jaynie Crumble, PA-C  ondansetron (ZOFRAN) 4 MG tablet Take 1 tablet (4 mg total) by mouth every 6 (six) hours. Patient not taking: Reported on  08/09/2016 04/02/15   Devoria Albe, MD  traMADol (ULTRAM) 50 MG tablet Take 1 tablet (50 mg total) by mouth every 6 (six) hours as needed. 02/08/17 02/08/18  Enid Derry, PA-C    Allergies Ceclor [cefaclor]  Family History  Problem Relation Age of Onset  . Cancer Mother   . Cancer Other     Social History Social History  Substance Use Topics  . Smoking status: Never Smoker  . Smokeless tobacco: Never Used  . Alcohol use Yes     Comment: social     Review of Systems  Constitutional: No fever/chills Cardiovascular: no chest pain. Respiratory: no cough. No SOB. Musculoskeletal: right hand splinted from potential fracture to the fourth metacarpal bone Skin: Negative for rash, abrasions, lacerations, ecchymosis. Neurological: Negative for headaches, focal weakness or numbness. 10-point ROS otherwise negative.  ____________________________________________   PHYSICAL EXAM:  VITAL SIGNS: ED Triage Vitals  Enc Vitals Group     BP 02/10/17 1754 114/70     Pulse Rate 02/10/17 1754 72     Resp 02/10/17 1754 18     Temp 02/10/17 1754 99.3 F (37.4 C)     Temp Source 02/10/17 1754 Oral     SpO2 02/10/17 1754 98 %     Weight 02/10/17 1752 140 lb (63.5 kg)     Height 02/10/17 1752  (1.727 m)     Head Circumference --      Peak Flow --      Pain Score 02/10/17 1752 6     Pain  Loc --      Pain Edu? --      Excl. in GC? --      Constitutional: Alert and oriented. Well appearing and in no acute distress. Eyes: Conjunctivae are normal. PERRL. EOMI. Head: Atraumatic. Neck: No stridor.    Cardiovascular: Normal rate, regular rhythm. Normal S1 and S2.  Good peripheral circulation. Respiratory: Normal respiratory effort without tachypnea or retractions. Lungs CTAB. Good air entry to the bases with no decreased or absent breath sounds. Musculoskeletal: Full range of motion to all extremities. No gross deformities appreciated.no visible deformity, edema, ecchymosis to the  right hand. Full range of motion all digits right hand.mild tenderness to palpation over the proximal fourth metacarpal bone. No palpable abnormality. Refill intact all digits. Sensation intact all digits. Neurologic:  Normal speech and language. No gross focal neurologic deficits are appreciated.  Skin:  Skin is warm, dry and intact. No rash noted. Psychiatric: Mood and affect are normal. Speech and behavior are normal. Patient exhibits appropriate insight and judgement.   ____________________________________________   LABS (all labs ordered are listed, but only abnormal results are displayed)  Labs Reviewed - No data to display ____________________________________________  EKG   ____________________________________________  RADIOLOGY  X-ray from 02/08/2017 is reviewed at this time  No results found.  ____________________________________________    PROCEDURES  Procedure(s) performed:    .Splint Application Date/Time: 02/10/2017 8:25 PM Performed by: Gala Romney D Authorized by: Gala Romney D   Consent:    Consent obtained:  Verbal   Consent given by:  Patient   Risks discussed:  Pain Procedure details:    Laterality:  Right   Location:  Hand   Hand:  R hand   Supplies used: Long finger splint with elastik tape and ace bandage. Post-procedure details:    Pain:  Improved   Sensation:  Normal   Patient tolerance of procedure:  Tolerated well, no immediate complications Comments:     Long finger splint is applied to the palm and fourth and fifth digits of the right hand. This is applied using elastic tape and Ace bandage. Patient tolerated well.      Medications - No data to display   ____________________________________________   INITIAL IMPRESSION / ASSESSMENT AND PLAN / ED COURSE  Pertinent labs & imaging results that were available during my care of the patient were reviewed by me and considered in my medical decision making (see  chart for details).  Review of the [place] CSRS was performed in accordance of the [facility] prior to dispensing any controlled drugs.     Patient's diagnosis is consistent with repeat visit for cortical disruption at the base of the fourth metacarpal bone.patient's original splint became wet and was unusable. Splint reapplied, this time using long finger splint with tape and Ace bandage for ease of removal..no medications at this time. Patient will follow-up with orthopedics as needed. Patient is given ED precautions to return to the ED for any worsening or new symptoms.     ____________________________________________  FINAL CLINICAL IMPRESSION(S) / ED DIAGNOSES  Final diagnoses:  Closed nondisplaced fracture of base of fourth metacarpal bone of right hand with routine healing, subsequent encounter      NEW MEDICATIONS STARTED DURING THIS VISIT:  Discharge Medication List as of 02/10/2017  6:54 PM          This chart was dictated using voice recognition software/Dragon. Despite best efforts to proofread, errors can occur which can change the meaning. Any change was purely  unintentional.    Lanette Hampshire 02/10/17 2027    Myrna Blazer, MD 02/10/17 [phone]

## 2017-05-18 NOTE — L&D Delivery Note (Addendum)
Date of delivery: 11/22/17 Estimated Date of Delivery: 11/20/17 Patient's last menstrual period was 02/13/2017. EGA: 5830w2d  Delivery Note At 6:02 AM a viable female was delivered via Vaginal, Spontaneous (Presentation: direct OA; cephalic).  APGAR: 8, 9; weight 9lb 14oz, 4500g .   Placenta status: spontaneous, intact with marginal cord insertion, <2cm from edge.  Cord:  3vv with the following complications: none apparent.  Cord pH: not collected as baby was vigorous immediately following delivery.  Anesthesia:  epidural Episiotomy: None Lacerations: None Suture Repair: n/a Est. Blood Loss (mL): 370cc measured  Mom presented to L&D with labor, given antibiotics for GBS prophylaxis, AROM'd for meconium. Epidual placed. Progressed to complete, second stage: <6860min, with delivery of fetal head with restitution to ROT.   Anterior then posterior shoulders delivered without difficulty.  Baby placed on mom's chest, and attended to by peds.  Cord was then clamped and cut by FOB after a >60sec delay.  Placenta spontaneously delivered, intact.   IV pitocin given for hemorrhage prophylaxis. No lacerations  We sang happy birthday to baby [name].  Mom to postpartum.  Baby to Couplet care / Skin to Skin.  Anwar Crill C Shakiyla Kook 11/22/2017, 6:30 AM

## 2017-07-15 ENCOUNTER — Inpatient Hospital Stay (HOSPITAL_COMMUNITY)
Admission: AD | Admit: 2017-07-15 | Discharge: 2017-07-15 | Disposition: A | Discharge status: Home / Self Care | Payer: BLUE CROSS/BLUE SHIELD | Source: Ambulatory Visit | Attending: Obstetrics and Gynecology | Admitting: Obstetrics and Gynecology

## 2017-07-15 ENCOUNTER — Encounter (HOSPITAL_COMMUNITY): Payer: Self-pay | Admitting: *Deleted

## 2017-07-15 DIAGNOSIS — O4692 Antepartum hemorrhage, unspecified, second trimester: Secondary | ICD-10-CM

## 2017-07-15 DIAGNOSIS — R102 Pelvic and perineal pain: Secondary | ICD-10-CM | POA: Diagnosis not present

## 2017-07-15 DIAGNOSIS — R82998 Other abnormal findings in urine: Secondary | ICD-10-CM

## 2017-07-15 DIAGNOSIS — Z3A21 21 weeks gestation of pregnancy: Secondary | ICD-10-CM | POA: Insufficient documentation

## 2017-07-15 DIAGNOSIS — R109 Unspecified abdominal pain: Secondary | ICD-10-CM

## 2017-07-15 DIAGNOSIS — O26852 Spotting complicating pregnancy, second trimester: Secondary | ICD-10-CM | POA: Insufficient documentation

## 2017-07-15 DIAGNOSIS — O26899 Other specified pregnancy related conditions, unspecified trimester: Secondary | ICD-10-CM

## 2017-07-15 LAB — URINALYSIS, ROUTINE W REFLEX MICROSCOPIC
Bilirubin Urine: NEGATIVE
Glucose, UA: NEGATIVE mg/dL
Hgb urine dipstick: NEGATIVE
Ketones, ur: NEGATIVE mg/dL
Nitrite: NEGATIVE
Protein, ur: NEGATIVE mg/dL
Specific Gravity, Urine: 1.016 (ref 1.005–1.030)
pH: 6 (ref 5.0–8.0)

## 2017-07-15 NOTE — MAU Note (Signed)
PT SAYS SHE GETS [facility] AT [place]  - IN [place]  - PLANS TO DELIVER  AT  [place]- .   DR  Dalbert GarnetBEASLEY IS IN  [place].  AT WORK TODAY AT 5PM- NOTICED -PINK- IN UNDERWEAR- THEN WIPED- LIGHT PINK.- SLIGHT CRAMPS.Marland Kitchen.         THEN AT 6 PM- TOOK  BATH-        CALLED DR -   [facility].     Marland Kitchen.   SAYS NOW  CRAMPS  ARE LESS.   DOESN'T  THINK  ANY  BLEEDING.    NO PROBLEMS  WITH PREG.    LAST SEX-  Tuesday

## 2017-07-15 NOTE — Discharge Instructions (Signed)
Vaginal Bleeding During Pregnancy, Second Trimester °A small amount of bleeding (spotting) from the vagina is common in pregnancy. Sometimes the bleeding is normal and is not a problem, and sometimes it is a sign of something serious. Be sure to tell your doctor about any bleeding from your vagina right away. °Follow these instructions at home: °· Watch your condition for any changes. °· Follow your doctor's instructions about how active you can be. °· If you are on bed rest: °? You may need to stay in bed and only get up to use the bathroom. °? You may be allowed to do some activities. °? If you need help, make plans for someone to help you. °· Write down: °? The number of pads you use each day. °? How often you change pads. °? How soaked (saturated) your pads are. °· Do not use tampons. °· Do not douche. °· Do not have sex or orgasms until your doctor says it is okay. °· If you pass any tissue from your vagina, save the tissue so you can show it to your doctor. °· Only take medicines as told by your doctor. °· Do not take aspirin because it can make you bleed. °· Do not exercise, lift heavy weights, or do any activities that take a lot of energy and effort unless your doctor says it is okay. °· Keep all follow-up visits as told by your doctor. °Contact a doctor if: °· You bleed from your vagina. °· You have cramps. °· You have labor pains. °· You have a fever that does not go away after you take medicine. °Get help right away if: °· You have very bad cramps in your back or belly (abdomen). °· You have contractions. °· You have chills. °· You pass large clots or tissue from your vagina. °· You bleed more. °· You feel light-headed or weak. °· You pass out (faint). °· You are leaking fluid or have a gush of fluid from your vagina. °This information is not intended to replace advice given to you by your health care provider. Make sure you discuss any questions you have with your health care provider. °Document  Released: 09/18/2013 Document Revised: 10/10/2015 Document Reviewed: 01/09/2013 °Elsevier Interactive Patient Education © 2018 Elsevier Inc[employer] ° ° °Pelvic Rest °Pelvic rest may be recommended if: °· Your placenta is partially or completely covering the opening of your cervix (placenta previa). °· There is bleeding between the wall of the uterus and the amniotic sac in the first trimester of pregnancy (subchorionic hemorrhage). °· You went into labor too early (preterm labor). ° °Based on your overall health and the health of your baby, your health care provider will decide if pelvic rest is right for you. °How do I rest my pelvis? °For as long as told by your health care provider: °· Do not have sex, sexual stimulation, or an orgasm. °· Do not use tampons. Do not douche. Do not put anything in your vagina. °· Do not lift anything that is heavier than 10 lb (4.5 kg). °· Avoid activities that take a lot of effort (are strenuous). °· Avoid any activity in which your pelvic muscles could become strained. ° °When should I seek medical care? °Seek medical care if you have: °· Cramping pain in your lower abdomen. °· Vaginal discharge. °· A low, dull backache. °· Regular contractions. °· Uterine tightening. ° °When should I seek immediate medical care? °Seek immediate medical care if: °· You have vaginal bleeding and you are   This information is not intended to replace advice given to you by your health care provider. Make sure you discuss any questions you have with your health care provider. Document Released: 08/29/2010 Document Revised: 10/10/2015 Document Reviewed: 11/05/2014 Elsevier Interactive Patient Education  [employer]2018 [employer]

## 2017-07-15 NOTE — MAU Provider Note (Addendum)
Chief Complaint:  Vaginal Bleeding   First Provider Initiated Contact with Patient 07/15/17 2104     HPI: Tiffany Whitehead is a 27 y.o. G2P0010 at 8321w6dwho presents to maternity admissions reporting pelvic cramping and spotting today.  Was told to come here. Lives in [place] but goes to [place] in [place]. Last IC 2 days ago.  She reports some flutters, never has felt much.  Denies LOF, vaginal itching/burning, urinary symptoms, h/a, dizziness, n/v, diarrhea, constipation or fever/chills.  .  Vaginal Bleeding  The patient's primary symptoms include pelvic pain and vaginal bleeding. The patient's pertinent negatives include no genital itching, genital lesions, genital odor or vaginal discharge. This is a new problem. The current episode started today. The problem occurs intermittently. The problem has been gradually improving. The pain is mild. She is pregnant. Pertinent negatives include no back pain, chills, constipation, diarrhea, dysuria, fever, frequency, headaches, nausea or vomiting. The vaginal discharge was scant and bloody. The vaginal bleeding is spotting. She has not been passing clots. She has not been passing tissue. Nothing aggravates the symptoms. She has tried nothing for the symptoms. She is sexually active.    RN Note: PT SAYS SHE GETS [facility] AT [facility]  - IN [place]  - PLANS TO DELIVER  AT  [place]- .   DR  Dalbert GarnetBEASLEY IS IN  [place].  AT WORK TODAY AT 5PM- NOTICED -PINK- IN UNDERWEAR- THEN WIPED- LIGHT PINK.- SLIGHT CRAMPS.Marland Kitchen.         THEN AT 6 PM- TOOK  BATH-        CALLED DR -   [facility].     Marland Kitchen.   SAYS NOW  CRAMPS  ARE LESS.   DOESN'T  THINK  ANY  BLEEDING.    NO PROBLEMS  WITH PREG.    LAST SEX-  Tuesday     Past Medical History: No past medical history on file.  Past obstetric history: OB History  Gravida Para Term Preterm AB Living  2       1    SAB TAB Ectopic Multiple Live Births  1            # Outcome Date GA Lbr Len/2nd Weight Sex Delivery Anes PTL  Lv  2 Current           1 SAB               Past Surgical History: Past Surgical History:  Procedure Laterality Date  . extraction of wisdom teeth      Family History: Family History  Problem Relation Age of Onset  . Cancer Mother   . Cancer Other     Social History: Social History   Tobacco Use  . Smoking status: Never Smoker  . Smokeless tobacco: Never Used  Substance Use Topics  . Alcohol use: Yes    Comment: social  . Drug use: No    Allergies:  Allergies  Allergen Reactions  . Ceclor [Cefaclor] Rash    Meds:  Medications Prior to Admission  Medication Sig Dispense Refill Last Dose  . dicyclomine (BENTYL) 20 MG tablet Take 1 tablet (20 mg total) by mouth 4 (four) times daily -  before meals and at bedtime. (Patient not taking: Reported on 08/09/2016) 20 tablet 0 Not Taking at Unknown time  . drospirenone-ethinyl estradiol (YAZ,GIANVI,LORYNA) 3-0.02 MG tablet Take 1 tablet by mouth daily.   08/08/2016 at Unknown time  . HYDROcodone-acetaminophen (NORCO/VICODIN) 5-325 MG tablet Take  1 tablet by mouth every 6 (six) hours as needed. 10 tablet 0   . lisdexamfetamine (VYVANSE) 30 MG capsule Take 30 mg by mouth daily.     Marland Kitchen LORazepam (ATIVAN) 1 MG tablet Take 1 tablet (1 mg total) by mouth 3 (three) times daily as needed for anxiety. (Patient not taking: Reported on 08/30/2014) 15 tablet 0 Not Taking at Unknown time  . ondansetron (ZOFRAN) 4 MG tablet Take 1 tablet (4 mg total) by mouth every 6 (six) hours. (Patient not taking: Reported on 08/09/2016) 5 tablet 0 Not Taking at Unknown time  . traMADol (ULTRAM) 50 MG tablet Take 1 tablet (50 mg total) by mouth every 6 (six) hours as needed. 8 tablet 0     I have reviewed patient's Past Medical Hx, Surgical Hx, Family Hx, Social Hx, medications and allergies.   ROS:  Review of Systems  Constitutional: Negative for chills and fever.  Gastrointestinal: Negative for constipation, diarrhea, nausea and vomiting.   Genitourinary: Positive for pelvic pain and vaginal bleeding. Negative for dysuria, frequency and vaginal discharge.  Musculoskeletal: Negative for back pain.  Neurological: Negative for headaches.   Other systems negative  Physical Exam   Patient Vitals for the past 24 hrs:  BP Temp Temp src Pulse Resp Height Weight  07/15/17 2049 122/71 98.7 F (37.1 C) Oral 82 20 5\' 8"  (1.727 m) 156 lb 8 oz (71 kg)   Constitutional: Well-developed, well-nourished female in no acute distress.  Cardiovascular: normal rate and rhythm Respiratory: normal effort, clear to auscultation bilaterally GI: Abd soft, non-tender, gravid appropriate for gestational age.   No rebound or guarding. MS: Extremities nontender, no edema, normal ROM Neurologic: Alert and oriented x 4.  GU: Neg CVAT.  PELVIC EXAM: Cervix pink, visually closed, without lesion, scant white creamy discharge, vaginal walls and external genitalia normal    Cervix does appear FRIABLE.   Bimanual exam: Cervix firm, posterior, neg CMT, uterus nontender, Fundal Height consistent with dates, adnexa without tenderness, enlargement, or mass   Cervix long and closed  FHT:  150   Labs: Results for orders placed or performed during the hospital encounter of 07/15/17 (from the past 24 hour(s))  Urinalysis, Routine w reflex microscopic     Status: Abnormal   Collection Time: 07/15/17  8:51 PM  Result Value Ref Range   Color, Urine YELLOW YELLOW   APPearance CLOUDY (A) CLEAR   Specific Gravity, Urine 1.016 1.005 - 1.030   pH 6.0 5.0 - 8.0   Glucose, UA NEGATIVE NEGATIVE mg/dL   Hgb urine dipstick NEGATIVE NEGATIVE   Bilirubin Urine NEGATIVE NEGATIVE   Ketones, ur NEGATIVE NEGATIVE mg/dL   Protein, ur NEGATIVE NEGATIVE mg/dL   Nitrite NEGATIVE NEGATIVE   Leukocytes, UA LARGE (A) NEGATIVE   RBC / HPF 0-5 0 - 5 RBC/hpf   WBC, UA 6-30 0 - 5 WBC/hpf   Bacteria, UA FEW (A) NONE SEEN   Squamous Epithelial / LPF 6-30 (A) NONE SEEN    Imaging:   Informal bedside US done to reassure patient Fetus was active, though patient did not feel movements Anterior placenta FHR 150s  MAU Course/MDM: I have ordered labs and reviewed results. Urine showed Leukocytes but + Sq Epithelial cells.   Urine culture ordered but think this may be a contaminated specimen.  .  Treatments in MAU included PO Fluids.    Assessment: Single IUP at [redacted]w[redacted]d Spotting in the second trimester Pelvic cramping, now resolved  Plan: Discharge home Pelvic rest  Preterm Labor precautions and fetal kick counts Follow up in Office for prenatal visits and recheck of status Call her doctor in [place] to report visit here Encouraged to return here or to other Urgent Care/ED if she develops worsening of symptoms, increase in pain, fever, or other concerning symptoms.   Pt stable at time of discharge.  Wynelle Bourgeois CNM, MSN Certified Nurse-Midwife 07/15/2017 9:04 PM

## 2017-07-17 LAB — CULTURE, OB URINE

## 2017-08-16 ENCOUNTER — Inpatient Hospital Stay (HOSPITAL_COMMUNITY): Payer: Medicaid Other

## 2017-08-16 ENCOUNTER — Inpatient Hospital Stay (HOSPITAL_COMMUNITY)
Admission: AD | Admit: 2017-08-16 | Discharge: 2017-08-16 | Disposition: A | Discharge status: Home / Self Care | Payer: Medicaid Other | Source: Ambulatory Visit | Attending: Obstetrics & Gynecology | Admitting: Obstetrics & Gynecology

## 2017-08-16 ENCOUNTER — Encounter (HOSPITAL_COMMUNITY): Payer: Self-pay | Admitting: *Deleted

## 2017-08-16 DIAGNOSIS — O368120 Decreased fetal movements, second trimester, not applicable or unspecified: Secondary | ICD-10-CM | POA: Diagnosis not present

## 2017-08-16 DIAGNOSIS — W19XXXA Unspecified fall, initial encounter: Secondary | ICD-10-CM

## 2017-08-16 DIAGNOSIS — Z3A26 26 weeks gestation of pregnancy: Secondary | ICD-10-CM

## 2017-08-16 DIAGNOSIS — O9A212 Injury, poisoning and certain other consequences of external causes complicating pregnancy, second trimester: Secondary | ICD-10-CM

## 2017-08-16 DIAGNOSIS — W010XXA Fall on same level from slipping, tripping and stumbling without subsequent striking against object, initial encounter: Secondary | ICD-10-CM | POA: Diagnosis not present

## 2017-08-16 HISTORY — DX: Anxiety disorder, unspecified: F41.9

## 2017-08-16 IMAGING — US US MFM OB LIMITED
1 series · 15 of 26 positions shown · non-contrast
Comparison: none

[Series 1: us mfm ob limited · 15 of 26 slices shown]
[im 1/26]
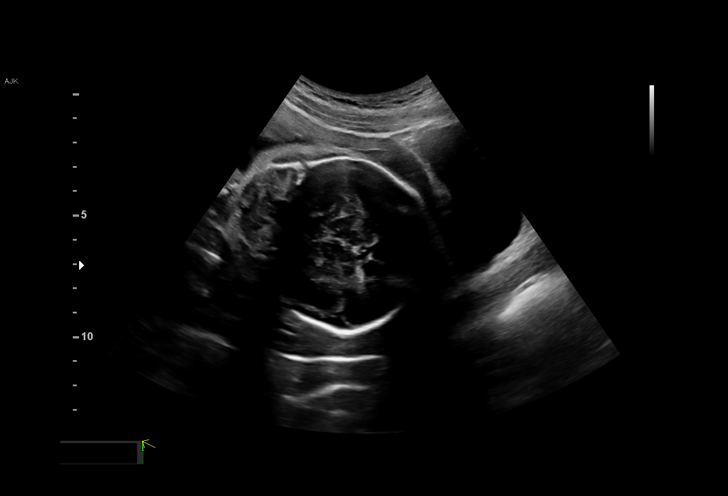
[im 3/26]
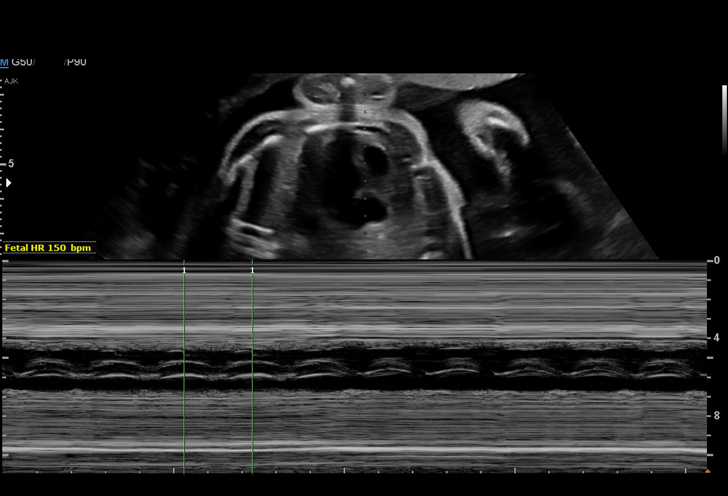
[im 5/26]
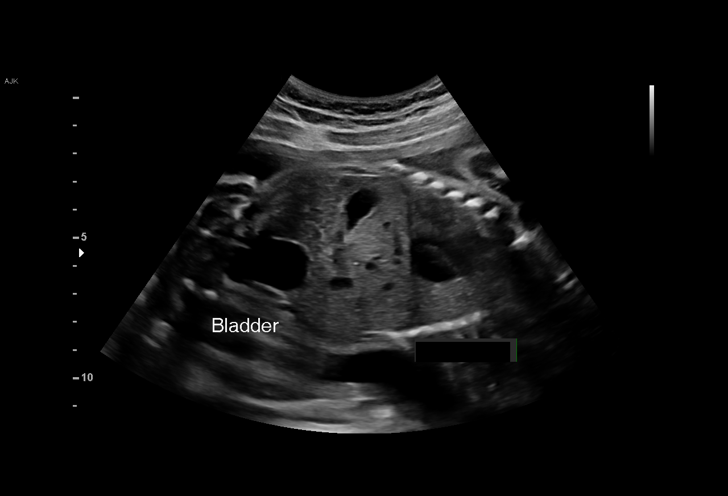
[im 7/26]
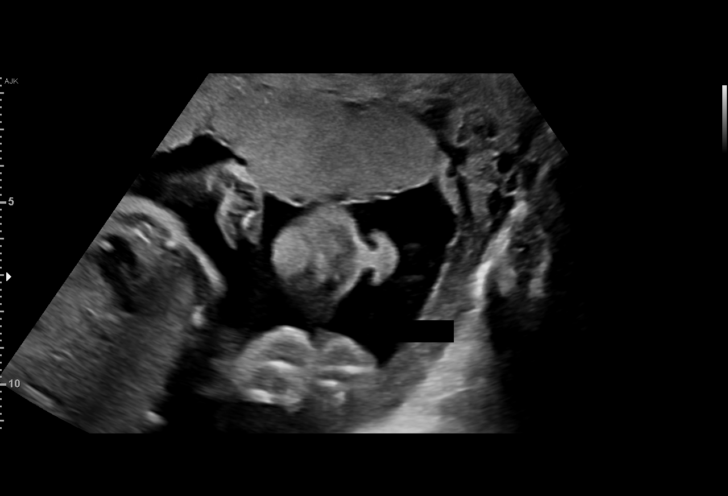
[im 8/26]
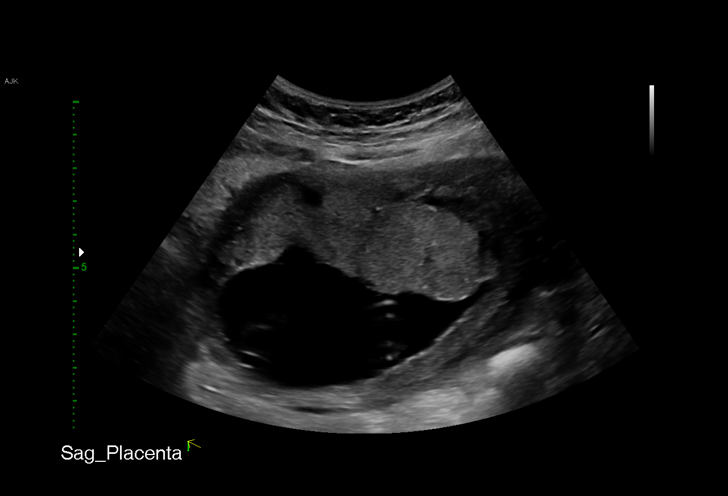
[im 10/26]
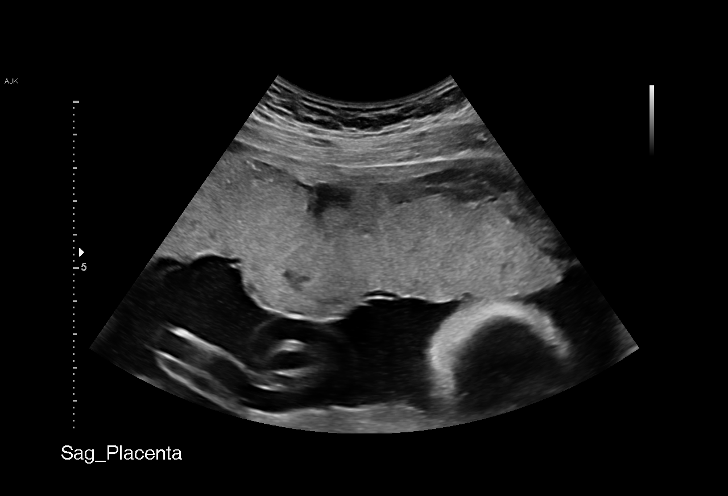
[im 12/26]
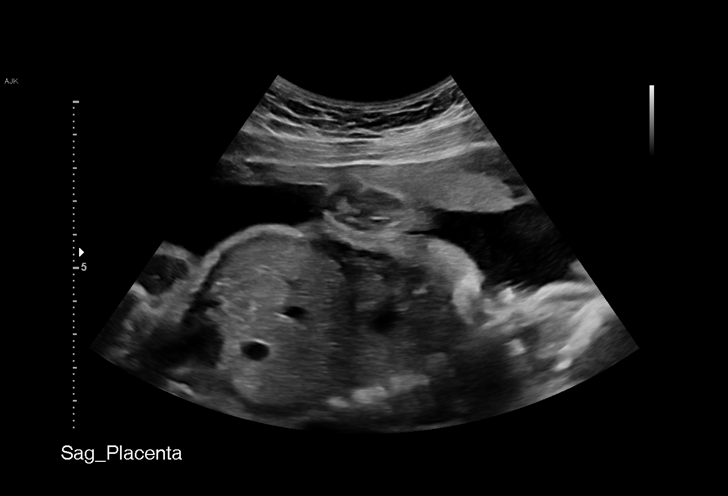
[im 14/26]
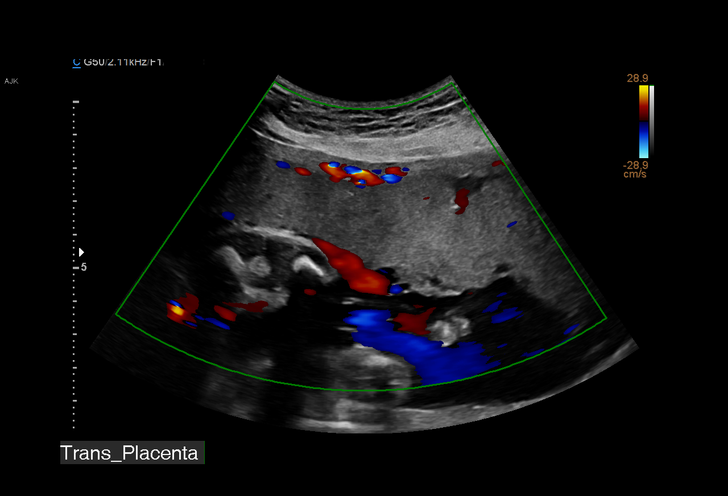
[im 15/26]
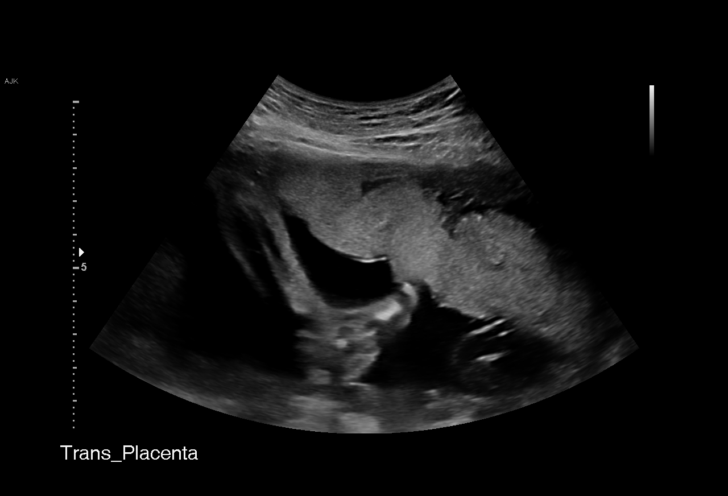
[im 17/26]
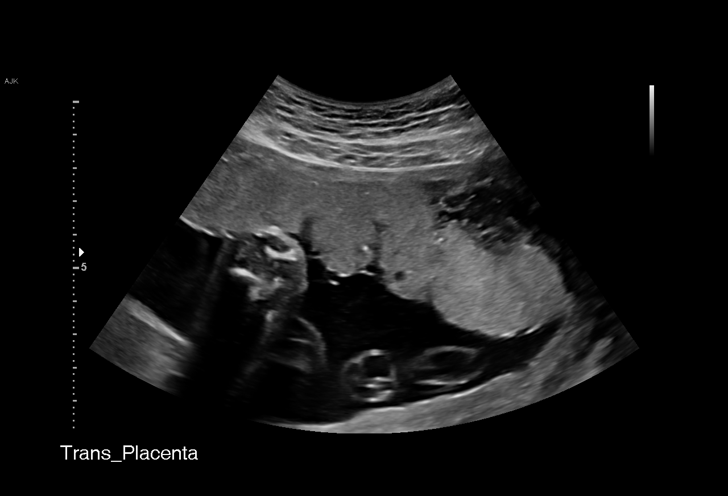
[im 19/26]
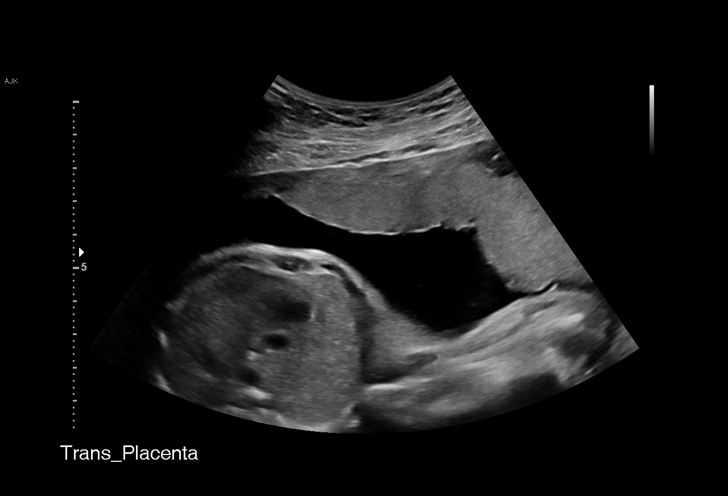
[im 20/26]
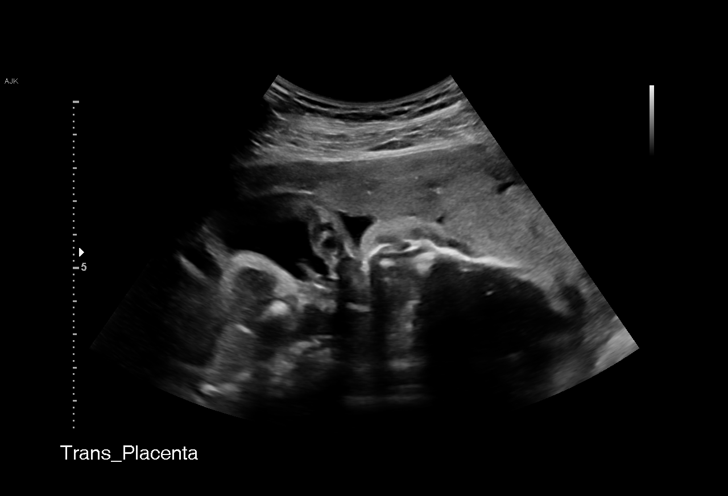
[im 22/26]
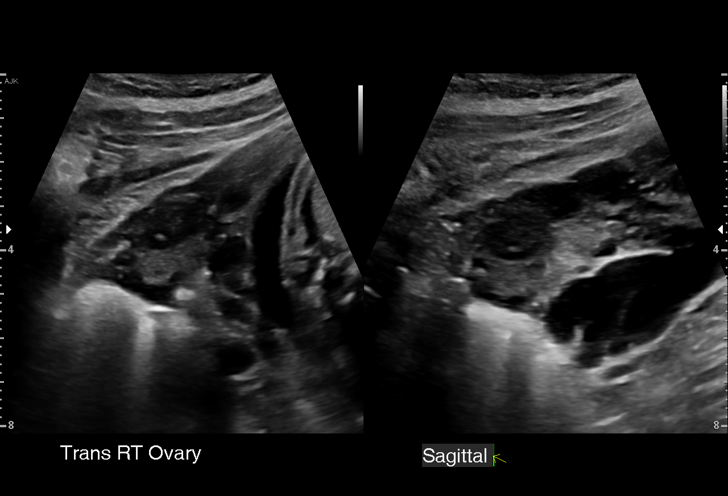
[im 24/26]
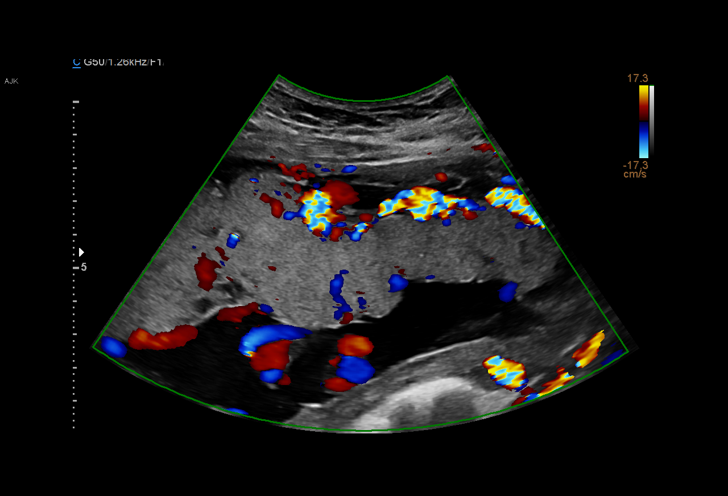
[im 26/26]
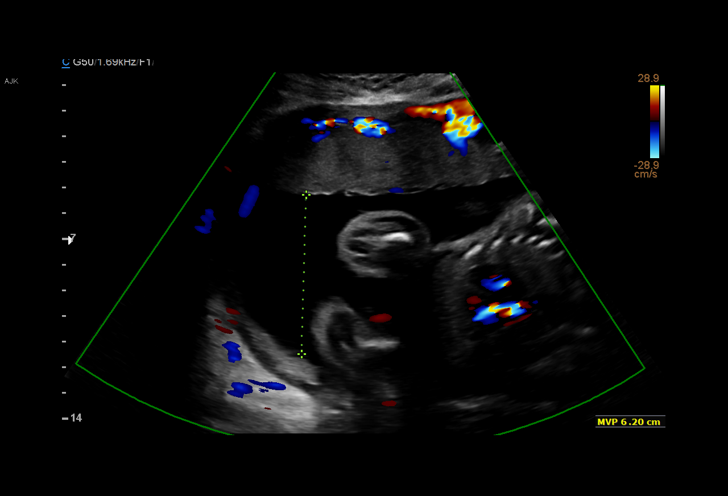

[15 of 26 positions shown; findings below may reference images not displayed]

1  NAJY          [PHONE_NUMBER]      [PHONE_NUMBER]     [PHONE_NUMBER]
Indications

26 weeks gestation of pregnancy
Abdominal pain in pregnancy                    [SU]
Decreased fetal movement                       [SU]
Traumatic injury during pregnancy (Fall)       O9A.219 [SU]
OB History

Blood Type:            Height:  5'8"   Weight (lb):  156       BMI:
Gravidity:    2         Term:   0        Prem:   0        SAB:   1
TOP:          0       Ectopic:  0        Living: 0
Fetal Evaluation

Num Of Fetuses:     1
Fetal Heart         150
Rate(bpm):
Cardiac Activity:   Observed
Presentation:       Cephalic
Placenta:           Anterior, above cervical os
P. Cord Insertion:  Visualized, central

Amniotic Fluid
AFI FV:      Subjectively within normal limits

Largest Pocket(cm)
6.2

Comment:    No placental abruption or previa identified on today's exam.
Movement seen. Stomach, bladder, and diaphragm noted.
Gestational Age

LMP:           27w 3d        Date:  [DATE]                 EDD:   [DATE]
Clinical EDD:  26w 3d                                        EDD:   [DATE]
Best:          26w 3d     Det. By:  Clinical EDD             EDD:   [DATE]
Cervix Uterus Adnexa

Cervix
Length:           4.02  cm.
Normal appearance by transabdominal scan.

Uterus
No abnormality visualized.

Left Ovary
Within normal limits.

Right Ovary
Within normal limits.

Adnexa:       No abnormality visualized.
Impression

IUP at 26+3 weeks S/P maternal trauma, here for requested
limited scan
Normal fetal movement and cardiac activity
Normal amniotic fluid
Anterior placenta without previa or markers for abruption
Fetal morphology grossly normal
Recommendations

Continue clinical evaluation and management.

## 2017-08-16 MED ORDER — ACETAMINOPHEN 500 MG PO TABS
1000.0000 mg | ORAL_TABLET | Freq: Four times a day (QID) | ORAL | Status: DC | PRN
  Administered 2017-08-16: 1000 mg via ORAL
  Filled 2017-08-16: qty 2

## 2017-08-16 MED ORDER — CYCLOBENZAPRINE HCL 10 MG PO TABS: 10.0000 mg | ORAL_TABLET | Freq: Two times a day (BID) | ORAL | 1 refills | Status: DC | PRN

## 2017-08-16 NOTE — Discharge Instructions (Signed)

## 2017-08-16 NOTE — MAU Note (Signed)
Pt fell yesterday. She fell onto stomach when she was carrying a box off her porch

## 2017-08-16 NOTE — MAU Provider Note (Addendum)
History     CSN: [id number]  Arrival date and time: 08/16/17 2110  Chief Complaint  Patient presents with  . Decreased Fetal Movement   G2P0010 @26 .3 wks here with no FM and s/p fall. She reports falling directly onto her abdomen yesterday am when she was carrying a box and missed a step. She has not felt FM since. Denies VB, LOF, and ctx. Having some LBP since the fall. Has not taken anything for it.    OB History    Gravida  2   Para      Term      Preterm      AB  1   Living        SAB  1   TAB      Ectopic      Multiple      Live Births              Past Medical History:  Diagnosis Date  . Medical history non-contributory     Past Surgical History:  Procedure Laterality Date  . COLONOSCOPY    . extraction of wisdom teeth      Family History  Problem Relation Age of Onset  . Cancer Mother   . Cancer Other     Social History   Tobacco Use  . Smoking status: Never Smoker  . Smokeless tobacco: Never Used  Substance Use Topics  . Alcohol use: Yes    Comment: social  . Drug use: No    Allergies:  Allergies  Allergen Reactions  . Ceclor [Cefaclor] Rash    Medications Prior to Admission  Medication Sig Dispense Refill Last Dose  . dicyclomine (BENTYL) 20 MG tablet Take 1 tablet (20 mg total) by mouth 4 (four) times daily -  before meals and at bedtime. (Patient not taking: Reported on 08/09/2016) 20 tablet 0 Not Taking at Unknown time  . drospirenone-ethinyl estradiol (YAZ,GIANVI,LORYNA) 3-0.02 MG tablet Take 1 tablet by mouth daily.   08/08/2016 at Unknown time  . HYDROcodone-acetaminophen (NORCO/VICODIN) 5-325 MG tablet Take 1 tablet by mouth every 6 (six) hours as needed. 10 tablet 0   . lisdexamfetamine (VYVANSE) 30 MG capsule Take 30 mg by mouth daily.     Marland Kitchen LORazepam (ATIVAN) 1 MG tablet Take 1 tablet (1 mg total) by mouth 3 (three) times daily as needed for anxiety. (Patient not taking: Reported on 08/30/2014) 15 tablet 0 Not Taking at  Unknown time  . ondansetron (ZOFRAN) 4 MG tablet Take 1 tablet (4 mg total) by mouth every 6 (six) hours. (Patient not taking: Reported on 08/09/2016) 5 tablet 0 Not Taking at Unknown time  . Prenatal Vit-Fe Fumarate-FA (PRENATAL MULTIVITAMIN) TABS tablet Take 1 tablet by mouth daily at 12 noon.   07/14/2017 at Unknown time  . traMADol (ULTRAM) 50 MG tablet Take 1 tablet (50 mg total) by mouth every 6 (six) hours as needed. 8 tablet 0     Review of Systems  Gastrointestinal: Negative for abdominal pain.  Genitourinary: Negative for vaginal bleeding and vaginal discharge.  Musculoskeletal: Positive for back pain.   Physical Exam   Blood pressure 122/78, pulse 95, temperature 98.4 F (36.9 C), resp. rate 16, last menstrual period 02/05/2017.  Physical Exam  Constitutional: She is oriented to person, place, and time. She appears well-developed and well-nourished. No distress.  HENT:  Head: Normocephalic and atraumatic.  Neck: Normal range of motion.  Respiratory: Effort normal. No respiratory distress.  GI: Soft. She exhibits no  distension. There is no tenderness.  gravid  Musculoskeletal: Normal range of motion.       Cervical back: Normal.       Thoracic back: She exhibits tenderness.       Lumbar back: She exhibits tenderness.  Neurological: She is alert and oriented to person, place, and time.  Skin: Skin is warm and dry.  Psychiatric: She has a normal mood and affect.  EFM: 145 bpm, mod variability, + accels, no decels Toco: none  No results found for this or any previous visit (from the past 24 hour(s)).  MAU Course  Procedures Tylenol  Heating pad  MDM Labs and US ordered. Transfer of care given to Rosalita LevanRogers, CNM Bhambri, Melanie, CNM  08/16/2017 10:05 PM   US results from 08/16/2017: FHR: 150 Presentation: cephalic  Placenta: anterior above cervical os  AFV: WNL No placental abruption or previa noted on US   Pt feels better after Tylenol and heating pad- reports pain  2/10 on right knee where there is a bruise. Discussed safe use of Tylenol during pregnancy, Rx sent for Flexeril- did not give here due to patient driving herself. Educated on reasons to return to MAU- pt verbalizes understanding.   Assessment and Plan   1. Decreased fetal movements in second trimester, single or unspecified fetus   2. [redacted] weeks gestation of pregnancy   3. Traumatic injury during pregnancy in second trimester   4. Fall, initial encounter    Pt discharged. Pt stable prior to discharge. Follow up as scheduled for prenatal appointments  Return to MAU as needed for emergencies   Follow-up Information    [facility] Follow up.   Why:  Follow up as scheduled for prenatal appointment  Contact information: [address] [place] [id number] [phone]         Allergies as of 08/16/2017      Reactions   Ceclor [cefaclor] Rash      Medication List    STOP taking these medications   dicyclomine 20 MG tablet Commonly known as:  BENTYL   drospirenone-ethinyl estradiol 3-0.02 MG tablet Commonly known as:  YAZ,GIANVI,LORYNA   HYDROcodone-acetaminophen 5-325 MG tablet Commonly known as:  NORCO/VICODIN   lisdexamfetamine 30 MG capsule Commonly known as:  VYVANSE   LORazepam 1 MG tablet Commonly known as:  ATIVAN   traMADol 50 MG tablet Commonly known as:  ULTRAM     TAKE these medications   cyclobenzaprine 10 MG tablet Commonly known as:  FLEXERIL Take 1 tablet (10 mg total) by mouth 2 (two) times daily as needed for muscle spasms.   ondansetron 4 MG tablet Commonly known as:  ZOFRAN Take 1 tablet (4 mg total) by mouth every 6 (six) hours.   prenatal multivitamin Tabs tablet Take 1 tablet by mouth daily at 12 noon.      Sharyon CableVeronica C Teja Judice, CNM 08/17/17, 2:48 AM

## 2017-08-19 ENCOUNTER — Other Ambulatory Visit: Payer: Self-pay | Admitting: Obstetrics and Gynecology

## 2017-08-19 ENCOUNTER — Observation Stay
Admission: EM | Admit: 2017-08-19 | Discharge: 2017-08-20 | Disposition: A | Discharge status: Home / Self Care | Payer: Medicaid Other | Attending: Obstetrics and Gynecology | Admitting: Obstetrics and Gynecology

## 2017-08-19 ENCOUNTER — Other Ambulatory Visit: Payer: Self-pay

## 2017-08-19 ENCOUNTER — Observation Stay: Payer: Medicaid Other

## 2017-08-19 ENCOUNTER — Encounter: Payer: Self-pay | Admitting: *Deleted

## 2017-08-19 DIAGNOSIS — Z79899 Other long term (current) drug therapy: Secondary | ICD-10-CM | POA: Diagnosis not present

## 2017-08-19 DIAGNOSIS — N3000 Acute cystitis without hematuria: Secondary | ICD-10-CM

## 2017-08-19 DIAGNOSIS — O99342 Other mental disorders complicating pregnancy, second trimester: Secondary | ICD-10-CM | POA: Insufficient documentation

## 2017-08-19 DIAGNOSIS — O2342 Unspecified infection of urinary tract in pregnancy, second trimester: Secondary | ICD-10-CM | POA: Diagnosis present

## 2017-08-19 DIAGNOSIS — Z3A26 26 weeks gestation of pregnancy: Secondary | ICD-10-CM | POA: Diagnosis not present

## 2017-08-19 DIAGNOSIS — O26832 Pregnancy related renal disease, second trimester: Secondary | ICD-10-CM | POA: Diagnosis not present

## 2017-08-19 DIAGNOSIS — N200 Calculus of kidney: Secondary | ICD-10-CM | POA: Diagnosis not present

## 2017-08-19 DIAGNOSIS — Z881 Allergy status to other antibiotic agents status: Secondary | ICD-10-CM | POA: Insufficient documentation

## 2017-08-19 DIAGNOSIS — O2340 Unspecified infection of urinary tract in pregnancy, unspecified trimester: Secondary | ICD-10-CM | POA: Diagnosis present

## 2017-08-19 DIAGNOSIS — N12 Tubulo-interstitial nephritis, not specified as acute or chronic: Secondary | ICD-10-CM

## 2017-08-19 DIAGNOSIS — F419 Anxiety disorder, unspecified: Secondary | ICD-10-CM | POA: Diagnosis not present

## 2017-08-19 DIAGNOSIS — N390 Urinary tract infection, site not specified: Secondary | ICD-10-CM | POA: Diagnosis present

## 2017-08-19 LAB — CBC WITH DIFFERENTIAL/PLATELET
BASOS PCT: 0 %
Band Neutrophils: 2 %
Basophils Absolute: 0 10*3/uL (ref 0–0.1)
Blasts: 0 %
EOS PCT: 1 %
Eosinophils Absolute: 0.2 10*3/uL (ref 0–0.7)
HCT: 34.8 % — ABNORMAL LOW (ref 35.0–47.0)
Hemoglobin: 11.7 g/dL — ABNORMAL LOW (ref 12.0–16.0)
LYMPHS ABS: 1.2 10*3/uL (ref 1.0–3.6)
LYMPHS PCT: 5 %
MCH: 31.3 pg (ref 26.0–34.0)
MCHC: 33.6 g/dL (ref 32.0–36.0)
MCV: 93.1 fL (ref 80.0–100.0)
MONO ABS: 2.2 10*3/uL — AB (ref 0.2–0.9)
Metamyelocytes Relative: 0 %
Monocytes Relative: 9 %
Myelocytes: 0 %
Neutro Abs: 21.2 10*3/uL — ABNORMAL HIGH (ref 1.4–6.5)
Neutrophils Relative %: 83 %
OTHER: 0 %
PLATELETS: 191 10*3/uL (ref 150–440)
Promyelocytes Absolute: 0 %
RBC: 3.74 MIL/uL — ABNORMAL LOW (ref 3.80–5.20)
RDW: 13.9 % (ref 11.5–14.5)
Smear Review: ADEQUATE
WBC: 24.8 10*3/uL — ABNORMAL HIGH (ref 3.6–11.0)
nRBC: 0 /100 WBC

## 2017-08-19 LAB — COMPREHENSIVE METABOLIC PANEL
ALBUMIN: 3.1 g/dL — AB (ref 3.5–5.0)
ALT: 15 U/L (ref 14–54)
ANION GAP: 9 (ref 5–15)
AST: 29 U/L (ref 15–41)
Alkaline Phosphatase: 77 U/L (ref 38–126)
BUN: 5 mg/dL — ABNORMAL LOW (ref 6–20)
CHLORIDE: 98 mmol/L — AB (ref 101–111)
CO2: 24 mmol/L (ref 22–32)
CREATININE: 0.62 mg/dL (ref 0.44–1.00)
Calcium: 8.4 mg/dL — ABNORMAL LOW (ref 8.9–10.3)
GFR calc non Af Amer: >60 mL/min (ref >60)
GLUCOSE: 106 mg/dL — AB (ref 65–99)
Potassium: 3.8 mmol/L (ref 3.5–5.1)
SODIUM: 131 mmol/L — AB (ref 135–145)
Total Bilirubin: 0.7 mg/dL (ref 0.3–1.2)
Total Protein: 7 g/dL (ref 6.5–8.1)

## 2017-08-19 LAB — TYPE AND SCREEN
ABO/RH(D): A POS
Antibody Screen: NEGATIVE

## 2017-08-19 MED ORDER — LACTATED RINGERS IV SOLN
INTRAVENOUS | Status: DC
  Administered 2017-08-19: 18:00:00 via INTRAVENOUS

## 2017-08-19 MED ORDER — MEROPENEM 1 G IV SOLR
1.0000 g | Freq: Three times a day (TID) | INTRAVENOUS | Status: DC
Start: 2017-08-19 — End: 2017-08-21
  Administered 2017-08-19 – 2017-08-20 (×4): 1 g via INTRAVENOUS
  Filled 2017-08-19 (×9): qty 1

## 2017-08-19 MED ORDER — LACTATED RINGERS IV SOLN: 1000.0000 mL | Freq: Once | INTRAVENOUS | 0 refills | Status: DC

## 2017-08-19 MED ORDER — LACTATED RINGERS IV SOLN: 500.0000 mL | INTRAVENOUS | Status: DC | PRN

## 2017-08-19 MED ORDER — OXYCODONE-ACETAMINOPHEN 5-325 MG PO TABS: 1.0000 | ORAL_TABLET | ORAL | Status: DC | PRN

## 2017-08-19 MED ORDER — OXYCODONE HCL 5 MG PO TABS: 5.0000 mg | ORAL_TABLET | ORAL | Status: DC | PRN

## 2017-08-19 MED ORDER — OXYCODONE HCL 5 MG PO TABS
10.0000 mg | ORAL_TABLET | ORAL | Status: DC | PRN
  Administered 2017-08-19 – 2017-08-20 (×3): 10 mg via ORAL
  Filled 2017-08-19 (×3): qty 2

## 2017-08-19 MED ORDER — LACTATED RINGERS IV SOLN
INTRAVENOUS | Status: DC
  Administered 2017-08-20 (×2): via INTRAVENOUS

## 2017-08-19 MED ORDER — ACETAMINOPHEN 325 MG PO TABS
650.0000 mg | ORAL_TABLET | ORAL | Status: DC | PRN
  Administered 2017-08-19 (×2): 650 mg via ORAL
  Filled 2017-08-19 (×2): qty 2

## 2017-08-19 MED ORDER — ONDANSETRON HCL 4 MG/2ML IJ SOLN: 4.0000 mg | Freq: Four times a day (QID) | INTRAMUSCULAR | Status: DC | PRN

## 2017-08-19 MED ORDER — DEXTROSE 5 % IN LACTATED RINGERS IV BOLUS
1000.0000 mL | Freq: Once | INTRAVENOUS | Status: AC
  Administered 2017-08-19: 1000 mL via INTRAVENOUS

## 2017-08-19 NOTE — Progress Notes (Signed)
Pt transferred to US via wheelchair per RN.

## 2017-08-19 NOTE — Discharge Summary (Signed)
Obstetric Discharge Summary   Patient ID: Tiffany Whitehead MRN: [id number] DOB/AGE: 29/09/1990 29 y.o.  See [name]'s OB DC summary

## 2017-08-19 NOTE — Progress Notes (Signed)
S: Resting comfortably but, had an episode of Rt lower lateral pain,  No  CTX, no LOF, VB. O: Vitals:   08/19/17 1503  BP: 110/66  Pulse: (!) 109  Resp: 18  Temp: 98.8 F (37.1 C)  TempSrc: Oral     Gen: NAD, AAOx3      Abd: FNTTP      Ext: Non-tender, Nonedmeatous    FHT: + mod var + accelerations no decelerations, Cat 1 TOCO: None WBC is high: 24,000   A/P:  27 y.o. yo G2P0010 at 22101w6d for suspected pyelonephritis.   Labor: none   FWB: Reassuring Cat 1 tracing.        P: Transfer to floor and continue Meripenum 1 gm IV q 12 h            2. Pt can be discharged tomorrow on Macrobid Rx or other antibiotic Sharee Pimplearon W [place] 8:55 PM  _____________ Myrtie Cruisearon W. Ellary Casamento,RN, MSN, CNM, FNP Certified Nurse Midwife [facility]

## 2017-08-19 NOTE — Consult Note (Signed)
Pharmacy Antibiotic Note  Tiffany Whitehead is a 27 y.o. female admitted on 08/19/2017 with UTI.  Patient has listed allergy to cephalosporins. Outpatient records say anaphylaxis & rash. After discussion with provider, pharmacy was consulted to dose Meropenem. Provider stated that mother of patient confirmed that there was no anaphylactic reaction in past.    Plan: Will start meropenem 1g IV every 8 hours.   Temp (24hrs), Avg:98.8 F (37.1 C), Min:98.8 F (37.1 C), Max:98.8 F (37.1 C)  Recent Labs  Lab 08/19/17 1543  WBC 24.8*  CREATININE 0.62    Estimated Creatinine Clearance: 104.7 mL/min (by C-G formula based on SCr of 0.62 mg/dL).    Allergies  Allergen Reactions  . Ceclor [Cefaclor] Rash    Antimicrobials this admission: 4/4 meropenem >>   Dose adjustments this admission:  Microbiology results: 4/4  UCx: pending   Thank you for allowing pharmacy to be a part of this patient's care.  Gardner CandleSheema M Quintez Maselli, PharmD, BCPS Clinical Pharmacist 08/19/2017 5:22 PM

## 2017-08-19 NOTE — Progress Notes (Signed)
Pt transferred from US to MB unit via wheelchair by RN.

## 2017-08-19 NOTE — OB Triage Note (Signed)
Patient sent over from office for uti/ kidney infection

## 2017-08-19 NOTE — H&P (Signed)
OB History & Physical   History of Present Illness:  Chief Complaint: lower back pain, fever, nausea since yest  HPI:  Tiffany Whitehead is a 27 y.o. G2P0010 female at [redacted]w[redacted]d dated by   She presents to L&D being sent by CJones, CNM for "fever of 100, lower back pain, UTI diagnosed on urinalysis today. Pt gave a urine sample at [facility] and it was sent for C&S.   +FM, no CTX, no LOF, no VB  Pregnancy Issues: 1. UTI   Maternal Medical History:   Past Medical History:  Diagnosis Date  . Anxiety   . Medical history non-contributory     Past Surgical History:  Procedure Laterality Date  . COLONOSCOPY    . extraction of wisdom teeth      Allergies  Allergen Reactions  . Ceclor [Cefaclor] Rash    Prior to Admission medications   Medication Sig Start Date End Date Taking? Authorizing Provider  cyclobenzaprine (FLEXERIL) 10 MG tablet Take 1 tablet (10 mg total) by mouth 2 (two) times daily as needed for muscle spasms. 08/16/17  Yes Sharyon Cable, CNM  Prenatal Vit-Fe Fumarate-FA (PRENATAL MULTIVITAMIN) TABS tablet Take 1 tablet by mouth daily at 12 noon.   Yes [provider]  lactated ringers infusion Inject 1,000 mLs into the vein once for 1 dose. Then run LR at 150 ml's hour Patient not taking: Reported on 08/19/2017 08/19/17 08/19/17  Sharee Pimple, CNM  ondansetron (ZOFRAN) 4 MG tablet Take 1 tablet (4 mg total) by mouth every 6 (six) hours. Patient not taking: Reported on 08/09/2016 04/02/15   Devoria Albe, MD     Prenatal care site: [facility]   Social History: She  reports that she has never smoked. She has never used smokeless tobacco. She reports that she drank alcohol. She reports that she does not use drugs.  Family History: family history includes Cancer in her mother and other.   Review of Systems: A full review of systems was performed and negative except as noted in the HPI.     Physical Exam:  Vital Signs: BP 110/66   Pulse (!) 109   Temp 98.8 F  (37.1 C) (Oral)   Resp 18   LMP 02/05/2017  General: no acute distress.  HEENT: normocephalic, atraumatic Heart: regular rate & rhythm.  No murmurs/rubs/gallops Lungs: clear to auscultation bilaterally, normal respiratory effort Abdomen: soft, gravid, non-tender;   Pelvic: deferred Extremities: non-tender, symmetric,  No edema bilaterally.  Neurologic: Alert & oriented x 3.    No results found for this or any previous visit (from the past 24 hour(s)).  Pertinent Results:  Prenatal Labs: Blood type/Rh   Antibody screen neg  Rubella Immune  Varicella Immune  RPR NR  HBsAg Neg  HIV NR  GC neg  Chlamydia neg  Genetic screening negative  1 hour GTT   3 hour GTT   GBS    FHT:130 TOCO:No contractions  Assessment:  Tiffany Whitehead is a 27 y.o. G2P0010 female at 109w6d with UTI vs Pyelonephritis.  Plan:  1. Admit to Labor & Delivery 2. CBC, T&S, Clrs, IVF 3. IV hydration with 1 liter of D5LR and then LR at 125 ml's/hour 4. Zofran 4 mg IV q 6 hours prn N/V 5. Continuous efm/toco 6. Merepenem IV per pharmacy for poss pyleonephritis as pt is allergic to Cefaclor and no cephalosporins can be given.  ----- Myrtie Cruise, MSN, CNM, FNP Certified Nurse Midwife [facility]  [facility]

## 2017-08-20 DIAGNOSIS — O2342 Unspecified infection of urinary tract in pregnancy, second trimester: Secondary | ICD-10-CM | POA: Diagnosis not present

## 2017-08-20 LAB — CBC
HCT: 29.5 % — ABNORMAL LOW (ref 35.0–47.0)
HCT: 30.7 % — ABNORMAL LOW (ref 35.0–47.0)
Hemoglobin: 9.8 g/dL — ABNORMAL LOW (ref 12.0–16.0)
Hemoglobin: 10.2 g/dL — ABNORMAL LOW (ref 12.0–16.0)
MCH: 30.8 pg (ref 26.0–34.0)
MCH: 30.5 pg (ref 26.0–34.0)
MCHC: 33.3 g/dL (ref 32.0–36.0)
MCHC: 33.3 g/dL (ref 32.0–36.0)
MCV: 92.4 fL (ref 80.0–100.0)
MCV: 91.5 fL (ref 80.0–100.0)
PLATELETS: 161 10*3/uL (ref 150–440)
PLATELETS: 166 10*3/uL (ref 150–440)
RBC: 3.2 MIL/uL — AB (ref 3.80–5.20)
RBC: 3.35 MIL/uL — AB (ref 3.80–5.20)
RDW: 14 % (ref 11.5–14.5)
RDW: 13.8 % (ref 11.5–14.5)
WBC: 13.3 10*3/uL — ABNORMAL HIGH (ref 3.6–11.0)
WBC: 10.5 10*3/uL (ref 3.6–11.0)

## 2017-08-20 LAB — INFLUENZA PANEL BY PCR (TYPE A & B)
INFLAPCR: NEGATIVE
Influenza B By PCR: NEGATIVE

## 2017-08-20 MED ORDER — SULFAMETHOXAZOLE-TRIMETHOPRIM 800-160 MG PO TABS: 1.0000 | ORAL_TABLET | Freq: Two times a day (BID) | ORAL | 0 refills | Status: AC

## 2017-08-20 MED ORDER — ONDANSETRON 4 MG PO TBDP: 4.0000 mg | ORAL_TABLET | Freq: Three times a day (TID) | ORAL | Status: DC | PRN

## 2017-08-20 MED ORDER — ACETAMINOPHEN 325 MG PO TABS: 650.0000 mg | ORAL_TABLET | ORAL | Status: DC | PRN

## 2017-08-20 MED ORDER — ACETAMINOPHEN 325 MG PO TABS
650.0000 mg | ORAL_TABLET | ORAL | Status: DC
  Administered 2017-08-20 (×3): 650 mg via ORAL
  Filled 2017-08-20 (×3): qty 2

## 2017-08-20 MED ORDER — OXYCODONE HCL 5 MG PO TABS: 5.0000 mg | ORAL_TABLET | Freq: Four times a day (QID) | ORAL | 0 refills | Status: DC | PRN

## 2017-08-20 MED ORDER — OXYCODONE HCL 5 MG PO TABS
5.0000 mg | ORAL_TABLET | ORAL | Status: DC | PRN
  Administered 2017-08-20: 5 mg via ORAL
  Filled 2017-08-20: qty 1

## 2017-08-20 MED ORDER — OXYCODONE HCL 5 MG PO TABS
10.0000 mg | ORAL_TABLET | ORAL | Status: DC | PRN
  Administered 2017-08-20: 10 mg via ORAL
  Filled 2017-08-20: qty 2

## 2017-08-20 NOTE — Discharge Summary (Signed)
Patient ID: Tiffany Whitehead MRN: [id number] DOB/AGE: 29/09/1990 29 y.o.  Admit date: 08/19/2017 Discharge date: 08/20/2017  Admission Diagnoses: UTI, nausea, lower back pain  Discharge Diagnoses: SIUP at 7536w6d with UTI, kidney stone, and possible pyelonephritis  Prenatal Procedures: renal ultrasound  Consults: Pharmacy  Significant Diagnostic Studies:  Results for orders placed or performed during the hospital encounter of 08/19/17 (from the past 168 hour(s))  Comprehensive metabolic panel   Collection Time: 08/19/17  3:43 PM  Result Value Ref Range   Sodium 131 (L) 135 - 145 mmol/L   Potassium 3.8 3.5 - 5.1 mmol/L   Chloride 98 (L) 101 - 111 mmol/L   CO2 24 22 - 32 mmol/L   Glucose, Bld 106 (H) 65 - 99 mg/dL   BUN 5 (L) 6 - 20 mg/dL   Creatinine, Ser 0.860.62 0.44 - 1.00 mg/dL   Calcium 8.4 (L) 8.9 - 10.3 mg/dL   Total Protein 7.0 6.5 - 8.1 g/dL   Albumin 3.1 (L) 3.5 - 5.0 g/dL   AST 29 15 - 41 U/L   ALT 15 14 - 54 U/L   Alkaline Phosphatase 77 38 - 126 U/L   Total Bilirubin 0.7 0.3 - 1.2 mg/dL   GFR calc non Af Amer >60 >60 mL/min   GFR calc Af Amer >60 >60 mL/min   Anion gap 9 5 - 15  CBC WITH DIFFERENTIAL   Collection Time: 08/19/17  3:43 PM  Result Value Ref Range   WBC 24.8 (H) 3.6 - 11.0 K/uL   RBC 3.74 (L) 3.80 - 5.20 MIL/uL   Hemoglobin 11.7 (L) 12.0 - 16.0 g/dL   HCT 57.834.8 (L) 46.935.0 - 62.947.0 %   MCV 93.1 80.0 - 100.0 fL   MCH 31.3 26.0 - 34.0 pg   MCHC 33.6 32.0 - 36.0 g/dL   RDW 52.813.9 41.311.5 - 24.414.5 %   Platelets 191 150 - 440 K/uL   Neutrophils Relative % 83 %   Lymphocytes Relative 5 %   Monocytes Relative 9 %   Eosinophils Relative 1 %   Basophils Relative 0 %   Band Neutrophils 2 %   Metamyelocytes Relative 0 %   Myelocytes 0 %   Promyelocytes Absolute 0 %   Blasts 0 %   nRBC 0 0 /100 WBC   Other 0 %   Neutro Abs 21.2 (H) 1.4 - 6.5 K/uL   Lymphs Abs 1.2 1.0 - 3.6 K/uL   Monocytes Absolute 2.2 (H) 0.2 - 0.9 K/uL   Eosinophils Absolute 0.2 0 - 0.7 K/uL    Basophils Absolute 0.0 0 - 0.1 K/uL   RBC Morphology POLYCHROMASIA PRESENT    Smear Review      PLATELET CLUMPS NOTED ON SMEAR, COUNT APPEARS ADEQUATE  Type and screen [facility]   Collection Time: 08/19/17  3:43 PM  Result Value Ref Range   ABO/RH(D) A POS    Antibody Screen NEG    Sample Expiration      08/22/2017 Performed at [facility], [address] [place], [place] [address]   CBC   Collection Time: 08/20/17  7:02 AM  Result Value Ref Range   WBC 13.3 (H) 3.6 - 11.0 K/uL   RBC 3.20 (L) 3.80 - 5.20 MIL/uL   Hemoglobin 9.8 (L) 12.0 - 16.0 g/dL   HCT 25.329.5 (L) 66.435.0 - 40.347.0 %   MCV 92.4 80.0 - 100.0 fL   MCH 30.8 26.0 - 34.0 pg   MCHC 33.3 32.0 -  36.0 g/dL   RDW 16.1 09.6 - 04.5 %   Platelets 161 150 - 440 K/uL  Influenza panel by PCR (type A & B)   Collection Time: 08/20/17 12:43 PM  Result Value Ref Range   Influenza A By PCR NEGATIVE NEGATIVE   Influenza B By PCR NEGATIVE NEGATIVE    Treatments: IV hydration, antibiotics: Meropenem and analgesia: acetaminophen and oxycodone  Hospital Course:  Tiffany Whitehead is a 27 y.o. G2P0010 with IUP at [redacted]w[redacted]d admitted for UTI and possible pyelonephritis due to fever at home to 100F, nausea with inability to keep solid food down, body aches, and feeling poorly. She reported no chills, no rigors, no dysuria, no urinary frequency, and no urinary urgency.   She was treated with IV hydration and 24 hours of IV antibiotics with meropenem 1g q8h. Her vital signs remained stable and she was afebrile her entire stay. Pain was managed with Tylenol and oxycodone PRN. She was able to tolerate a regular diet without emesis. She was voiding without issue.   She was concerned for leakage of fluid, but speculum exam revealed no pooling and negative nitrazine test. Fluid presumed to be bladder leakage or vaginal discharge.   She was deemed stable for discharge to home with outpatient follow up.  Discharge Physical  Exam:  BP 104/62 (BP Location: Right Arm)   Pulse 86   Temp 97.9 F (36.6 C)   Resp 18   LMP 02/13/2017   SpO2 100%   General: NAD CV: RRR Pulm: CTABL, nl effort ABD: gravid, soft, non-distended, minor R sided tenderness Back: R sided CVAT and R lower back tenderness DVT Evaluation: LE non-ttp, no evidence of DVT on exam.  FHT: 150bpm by doppler  Discharge Condition: Stable  Disposition:  Discharge disposition: 01-Home or Self Care       Allergies as of 08/20/2017      Reactions   Ceclor [cefaclor] Rash      Medication List    STOP taking these medications   lactated ringers infusion   ondansetron 4 MG tablet Commonly known as:  ZOFRAN     TAKE these medications   acetaminophen 325 MG tablet Commonly known as:  TYLENOL Take 2 tablets (650 mg total) by mouth every 4 (four) hours as needed for mild pain or moderate pain.   cyclobenzaprine 10 MG tablet Commonly known as:  FLEXERIL Take 1 tablet (10 mg total) by mouth 2 (two) times daily as needed for muscle spasms.   oxyCODONE 5 MG immediate release tablet Commonly known as:  Oxy IR/ROXICODONE Take 1 tablet (5 mg total) by mouth every 6 (six) hours as needed for breakthrough pain.   prenatal multivitamin Tabs tablet Take 1 tablet by mouth daily at 12 noon.   sulfamethoxazole-trimethoprim 800-160 MG tablet Commonly known as:  BACTRIM DS,SEPTRA DS Take 1 tablet by mouth 2 (two) times daily for 14 days.       1. UTI vs pyelonephritis: -Sulfamethoxazole-trimethoprim 800-160 BID x14 days, prescription sent. -Urine culture pending from the office.   2. Pain: -Heat aplication -650mg  Tyelenol q4h for pain -5mg  oxycodone q4h PRN for breakthrough pain, prescription for 5 tablets sent.   3. Kidney stone: -Advised hydration and pain medicine as needed.   3. Routine antenatal care: -Follow-up in the office as scheduled. -Precautions reviewed.    SignedGenia Del 08/20/2017 7:51  PM ----- [name], CNM Certified Nurse Midwife [facility]

## 2017-08-20 NOTE — Progress Notes (Signed)
ANTEPARTUM PROGRESS NOTE  Tiffany Whitehead is a 27 y.o. G2P0010 at 69w6dwho is admitted for UTI vs pyelonephritis.  Estimated Date of Delivery: 11/20/17  Length of Stay:  0 Days. Admitted 08/19/2017  Subjective: Patient resting in bed. She reports improvement to her right lower abdominal pain and no nausea or vomiting with liquids. She wants to go home as soon as possible. She reports fetal movement, no uterine contractions, and no bleeding. She is having leakage of fluid, but is unsure if it is urine, vaginal discharge, or amniotic fluid.   Vitals:  BP 106/61 (BP Location: Right Arm)   Pulse 100   Temp 98.6 F (37 C) (Oral)   Resp 18   LMP 02/13/2017   SpO2 98%  Physical Examination: General:   alert, cooperative, appears stated age and no distress  Skin:  normal and no rash or abnormalities  Neurologic:    Alert & oriented x 3  Lungs:   clear to auscultation bilaterally  Heart:   regular rate and rhythm, S1, S2 normal, no murmur, click, rub or gallop  Back:  R sided CVAT  Abdomen:  soft, non-tender; bowel sounds normal; no masses,  no organomegaly  Pelvis:  Exam deferred.  Extremities: : non-tender, symmetric, no edema bilaterally.     Results for orders placed or performed during the hospital encounter of 08/19/17 (from the past 48 hour(s))  Type and screen [place]    Status: None   Collection Time: 08/19/17  3:43 PM  Result Value Ref Range   ABO/RH(D) A POS    Antibody Screen NEG    Sample Expiration      08/22/2017 [facility]formed at [facility] [address], [place] [address]  Comprehensive metabolic panel     Status: Abnormal   Collection Time: 08/19/17  3:43 PM  Result Value Ref Range   Sodium 131 (L) 135 - 145 mmol/L   Potassium 3.8 3.5 - 5.1 mmol/L   Chloride 98 (L) 101 - 111 mmol/L   CO2 24 22 - 32 mmol/L   Glucose, Bld 106 (H) 65 - 99 mg/dL   BUN 5 (L) 6 - 20 mg/dL   Creatinine, Ser 0.62 0.44 - 1.00 mg/dL   Calcium 8.4  (L) 8.9 - 10.3 mg/dL   Total Protein 7.0 6.5 - 8.1 g/dL   Albumin 3.1 (L) 3.5 - 5.0 g/dL   AST 29 15 - 41 U/L   ALT 15 14 - 54 U/L   Alkaline Phosphatase 77 38 - 126 U/L   Total Bilirubin 0.7 0.3 - 1.2 mg/dL   GFR calc non Af Amer >60 >60 mL/min   GFR calc Af Amer >60 >60 mL/min    Comment: (NOTE) The eGFR has been calculated using the CKD EPI equation. This calculation has not been validated in all clinical situations. eGFR's persistently <60 mL/min signify possible Chronic Kidney Disease.    Anion gap 9 5 - 15    Comment: Performed at [facility] [address], [place] [address] CBC WITH DIFFERENTIAL     Status: Abnormal   Collection Time: 08/19/17  3:43 PM  Result Value Ref Range   WBC 24.8 (H) 3.6 - 11.0 K/uL   RBC 3.74 (L) 3.80 - 5.20 MIL/uL   Hemoglobin 11.7 (L) 12.0 - 16.0 g/dL   HCT 34.8 (L) 35.0 - 47.0 %   MCV 93.1 80.0 - 100.0 fL   MCH 31.3 26.0 -  34.0 pg   MCHC 33.6 32.0 - 36.0 g/dL   RDW 13.9 11.5 - 14.5 %   Platelets 191 150 - 440 K/uL   Neutrophils Relative % 83 %   Lymphocytes Relative 5 %   Monocytes Relative 9 %   Eosinophils Relative 1 %   Basophils Relative 0 %   Band Neutrophils 2 %   Metamyelocytes Relative 0 %   Myelocytes 0 %   Promyelocytes Absolute 0 %   Blasts 0 %   nRBC 0 0 /100 WBC   Other 0 %   Neutro Abs 21.2 (H) 1.4 - 6.5 K/uL   Lymphs Abs 1.2 1.0 - 3.6 K/uL   Monocytes Absolute 2.2 (H) 0.2 - 0.9 K/uL   Eosinophils Absolute 0.2 0 - 0.7 K/uL   Basophils Absolute 0.0 0 - 0.1 K/uL   RBC Morphology POLYCHROMASIA PRESENT    Smear Review      PLATELET CLUMPS NOTED ON SMEAR, COUNT APPEARS ADEQUATE    Comment: Performed at [facility], [address] [place], [place] [address]  CBC     Status: Abnormal   Collection Time: 08/20/17  7:02 AM  Result Value Ref Range   WBC 13.3 (H) 3.6 - 11.0 K/uL   RBC 3.20 (L) 3.80 - 5.20 MIL/uL   Hemoglobin 9.8 (L) 12.0 - 16.0 g/dL   HCT 29.5 (L) 35.0 - 47.0 %   MCV 92.4  80.0 - 100.0 fL   MCH 30.8 26.0 - 34.0 pg   MCHC 33.3 32.0 - 36.0 g/dL   RDW 14.0 11.5 - 14.5 %   Platelets 161 150 - 440 K/uL    Comment: Performed at [facility], [address] [place], [place] [address]    US Renal  Result Date: 08/19/2017 CLINICAL DATA:  Right back and flank pain with elevated white count, [redacted] weeks pregnant EXAM: RENAL / URINARY TRACT ULTRASOUND COMPLETE COMPARISON:  08/16/2017, CT 04/02/2015 FINDINGS: Right Kidney: Length: 12.3 cm. Cortical echogenicity within normal limits. Moderate right hydronephrosis and dilatation of the proximal ureter. Possible 7 mm stone in the lower pole of the right kidney. Left Kidney: Length: 11.1 cm. Echogenicity within normal limits. No mass or hydronephrosis visualized. Bladder: Appears normal for degree of bladder distention. Bilateral jets are visualized IMPRESSION: 1. Moderate right hydronephrosis and proximal hydroureter. Possible 7 mm stone in the lower pole of the right kidney 2. Normal left kidney Electronically Signed   By: Donavan Foil M.D.   On: 08/19/2017 21:30    ASSESSMENT: Patient Active Problem List   Diagnosis Date Noted  . UTI (urinary tract infection) 08/19/2017  . UTI (urinary tract infection) during pregnancy 08/19/2017    PLAN: 1. UTI vs pyelonephritis vs kidney stone: -Continue IV antibiotics as ordered. -Continue IV hydration.  -Urine culture pending from the office.  -WBC improved, afebrile, VSS -Strain all urine  2. Nausea: -Zofran ODT 28m q8h PRN -Advance diet as tolerated  3. Pain: -6570mTyelenol q4h PRN -5-1057mxycodone q4h PRN for breakthrough pain  4. Leakage of fluid: -Plan for speculum exam to rule out ROM  5. Fetal well-being: -Assess FHT by doppler q nursing shift.   Plan to discuss plan of care with Dr. BetBenjaman Kindler make discharge and outpatient management plans.   MarLisette GrinderNMNorth [place]5/2019 9:11 AM  MarLisette Grinderrtified Nurse Midwife [facility]

## 2017-08-20 NOTE — Progress Notes (Signed)
08/20/2017 9:32 PM  BP 104/62 (BP Location: Right Arm)   Pulse 86   Temp 97.9 F (36.6 C)   Resp 18   LMP 02/13/2017   SpO2 100%  Patient discharged per MD orders. Discharge instructions reviewed with patient and patient verbalized understanding. IV removed per policy. Prescriptions discussed with patient. Discharged via wheelchair escorted by nursing staff.  Ron ParkerHerron, Lilliemae Fruge D, RN

## 2017-08-20 NOTE — Discharge Instructions (Signed)
Pyelonephritis, Adult Pyelonephritis is a kidney infection. The kidneys are organs that help clean your blood by moving waste out of your blood and into your pee (urine). This infection can happen quickly, or it can last for a long time. In most cases, it clears up with treatment and does not cause other problems. Follow these instructions at home: Medicines  Take over-the-counter and prescription medicines only as told by your doctor.  Take your antibiotic medicine as told by your doctor. Do not stop taking the medicine even if you start to feel better. General instructions  Drink enough fluid to keep your pee clear or pale yellow.  Avoid caffeine, tea, and carbonated drinks.  Pee (urinate) often. Avoid holding in pee for long periods of time.  Pee before and after sex.  After pooping (having a bowel movement), women should wipe from front to back. Use each tissue only once.  Keep all follow-up visits as told by your doctor. This is important. Contact a doctor if:  You do not feel better after 2 days.  Your symptoms get worse.  You have a fever. Get help right away if:  You cannot take your medicine or drink fluids as told.  You have chills and shaking.  You throw up (vomit).  You have very bad pain in your side (flank) or back.  You feel very weak or you pass out (faint). This information is not intended to replace advice given to you by your health care provider. Make sure you discuss any questions you have with your health care provider. Document Released: 06/11/2004 Document Revised: 10/10/2015 Document Reviewed: 08/27/2014 Elsevier Interactive Patient Education  2018 [employer]     Instructions for Patients & Families  Preterm Labor    What is Preterm Labor?  Preterm labor is labor that happens before 37 weeks of pregnancy. Preterm labor involves regular contractions and cervical change. Cervical change is opening (dilating) and thinning out (effacing) of  the cervix. It is not possible to tell which contractions will change the cervix. If you have frequent, regular contractions before 37 weeks of pregnancy, you need to contact your provider.   Why is Preterm Labor a problem?  Babies that are born before 37 weeks have many different types of problems, depending on how young or small they are. While many premature babies will survive, they may have problems breathing, fighting off infection, keeping a normal body temperature, and eating.   Preterm baby's health problems may continue for a long time, even years. You may know someone who has had a preterm baby or have a previous child who was born early. That is fine, but that does not guarantee that the child you are carrying now will be okay. It also does not guarantee that a baby who does fine after birth will not have problems when they become school age.   Who may have Preterm Labor?  Anyone can have preterm labor but certain people are at a higher risk.   Previous premature labor or preterm birth   Infections   Oddly shaped uterus   More than one baby (Twins, triplets, etc.)   Weak cervix   Too much fluid around the baby   Premature rupture of membranes (water bag breaks early)   Large fibroid in the uterus   Cocaine use   What are the signs of Preterm Labor?  One or more of the following signs may mean that preterm labor is starting. If you feel any of these  prior to 37 weeks contact your provider immediately.   Contractions- Contractions, or tightening of the uterus (womb), can happen sometimes throughout the pregnancy but is it not normal to contract frequently and regularly (every 15 minutes or closer) before 37 weeks of pregnancy. Preterm contractions may be painless, but the uterus will feel hard all over.   Menstrual-type Cramps- Usually felt very low in the abdomen, these cramps may come and go or may be constant.   Lower Backache-This is a dull pain at the bottom of your  spine, which may move to you sides or belly. It may come and go or may be constant.   Pressure- Pressure is felt in the vagina, as if the baby were going to fall out.   Intestinal Cramps- This can feel like gas pains, and diarrhea may also happen.   Change in Vaginal Discharge-Your discharge may become heavier or watery, and may be tinged with blood.   What will happen next?  When you call, your provider may tell you to lie down and feel for the contractions for an hour while you drink some water. You feel for contractions by lying on your left side and putting your fingertips on your abdomen. Your uterus will feel tight and firm (like your forehead) when you are having a contraction and soft (like the tip or your nose) when you are not contracting. Time your contractions from the beginning of one to the beginning of the next for an hour. If you are having contractions or tightening every fifteen minutes or closer, come to the hospital.   It is very important that you don't ignore symptoms of preterm labor. Our best chance to treat preterm labor is to treat early.   If you have any further questions, please contact your provider (Doctor, nurse practitioner or nurse midwife).   Call your provider or come back to the hospital if you have any of the following:   4 or more contractions in one hour before 37 weeks of pregnancy   Leakage of fluid or you think your water breaks   Bright red bleeding from your vagina   Fever greater than 100.4   Decrease in your babys normal movements   If you feel a decrease in your babys normal movements and are greater than 28 weeks you should do kick counts. You should count your babys movements at least once a day for 2 hours while lying down on your side. You should feel at least 10 movements in this time period

## 2017-08-20 NOTE — Progress Notes (Signed)
ANTEPARTUM PROGRESS NOTE  Tiffany Whitehead is a 27 y.o. G2P0010 at 50w6dwho is admitted for UTI vs pyelonephritis.  Estimated Date of Delivery: 11/20/17  Length of Stay: 1 Day. Admitted 08/19/2017  Subjective: Patient resting in bed. She ate some cheerios and fruit with breakfast and is not having any nausea. She reports good fetal movement, no contractions, and no bleeding. She wants to be discharged to home as soon as safely possible.  Vitals:  BP (!) 106/59 (BP Location: Right Arm)   Pulse (!) 105   Temp (!) 97.4 F (36.3 C) (Oral)   Resp 18   LMP 02/13/2017   SpO2 98%  Physical Examination: General:   alert, cooperative, appears stated age and no distress  Pelvis:  External genitalia: normal general appearance Vaginal: normal mucosa without prolapse or lesions with moderate about of white physiologic appearing discharge, no pooling of fluid, negative nitrazine Cervix: visually closed    Results for orders placed or performed during the hospital encounter of 08/19/17 (from the past 48 hour(s))  Type and screen [place]    Status: None   Collection Time: 08/19/17  3:43 PM  Result Value Ref Range   ABO/RH(D) A POS    Antibody Screen NEG    Sample Expiration      08/22/2017 [facility]formed at [facility] [address], [place] [address]  Comprehensive metabolic panel     Status: Abnormal   Collection Time: 08/19/17  3:43 PM  Result Value Ref Range   Sodium 131 (L) 135 - 145 mmol/L   Potassium 3.8 3.5 - 5.1 mmol/L   Chloride 98 (L) 101 - 111 mmol/L   CO2 24 22 - 32 mmol/L   Glucose, Bld 106 (H) 65 - 99 mg/dL   BUN 5 (L) 6 - 20 mg/dL   Creatinine, Ser 0.62 0.44 - 1.00 mg/dL   Calcium 8.4 (L) 8.9 - 10.3 mg/dL   Total Protein 7.0 6.5 - 8.1 g/dL   Albumin 3.1 (L) 3.5 - 5.0 g/dL   AST 29 15 - 41 U/L   ALT 15 14 - 54 U/L   Alkaline Phosphatase 77 38 - 126 U/L   Total Bilirubin 0.7 0.3 - 1.2 mg/dL   GFR calc non Af Amer >60 >60 mL/min    GFR calc Af Amer >60 >60 mL/min    Comment: (NOTE) The eGFR has been calculated using the CKD EPI equation. This calculation has not been validated in all clinical situations. eGFR's persistently <60 mL/min signify possible Chronic Kidney Disease.    Anion gap 9 5 - 15    Comment: [facility]formed [facility] [address], [place] [address] CBC WITH DIFFERENTIAL     Status: Abnormal   Collection Time: 08/19/17  3:43 PM  Result Value Ref Range   WBC 24.8 (H) 3.6 - 11.0 K/uL   RBC 3.74 (L) 3.80 - 5.20 MIL/uL   Hemoglobin 11.7 (L) 12.0 - 16.0 g/dL   HCT 34.8 (L) 35.0 - 47.0 %   MCV 93.1 80.0 - 100.0 fL   MCH 31.3 26.0 - 34.0 pg   MCHC 33.6 32.0 - 36.0 g/dL   RDW 13.9 11.5 - 14.5 %   Platelets 191 150 - 440 K/uL   Neutrophils Relative % 83 %   Lymphocytes Relative 5 %   Monocytes Relative 9 %   Eosinophils Relative 1 %   Basophils Relative 0 %   Band Neutrophils  2 %   Metamyelocytes Relative 0 %   Myelocytes 0 %   Promyelocytes Absolute 0 %   Blasts 0 %   nRBC 0 0 /100 WBC   Other 0 %   Neutro Abs 21.2 (H) 1.4 - 6.5 K/uL   Lymphs Abs 1.2 1.0 - 3.6 K/uL   Monocytes Absolute 2.2 (H) 0.2 - 0.9 K/uL   Eosinophils Absolute 0.2 0 - 0.7 K/uL   Basophils Absolute 0.0 0 - 0.1 K/uL   RBC Morphology POLYCHROMASIA PRESENT    Smear Review      PLATELET CLUMPS NOTED ON SMEAR, COUNT APPEARS ADEQUATE    Comment: Performed at [facility], [address] [place], [place] [address]  CBC     Status: Abnormal   Collection Time: 08/20/17  7:02 AM  Result Value Ref Range   WBC 13.3 (H) 3.6 - 11.0 K/uL   RBC 3.20 (L) 3.80 - 5.20 MIL/uL   Hemoglobin 9.8 (L) 12.0 - 16.0 g/dL   HCT 29.5 (L) 35.0 - 47.0 %   MCV 92.4 80.0 - 100.0 fL   MCH 30.8 26.0 - 34.0 pg   MCHC 33.3 32.0 - 36.0 g/dL   RDW 14.0 11.5 - 14.5 %   Platelets 161 150 - 440 K/uL    Comment: Performed at [facility], [address] [place], [place] [address]    US Renal  Result Date:  08/19/2017 CLINICAL DATA:  Right back and flank pain with elevated white count, [redacted] weeks pregnant EXAM: RENAL / URINARY TRACT ULTRASOUND COMPLETE COMPARISON:  08/16/2017, CT 04/02/2015 FINDINGS: Right Kidney: Length: 12.3 cm. Cortical echogenicity within normal limits. Moderate right hydronephrosis and dilatation of the proximal ureter. Possible 7 mm stone in the lower pole of the right kidney. Left Kidney: Length: 11.1 cm. Echogenicity within normal limits. No mass or hydronephrosis visualized. Bladder: Appears normal for degree of bladder distention. Bilateral jets are visualized IMPRESSION: 1. Moderate right hydronephrosis and proximal hydroureter. Possible 7 mm stone in the lower pole of the right kidney 2. Normal left kidney Electronically Signed   By: Donavan Foil M.D.   On: 08/19/2017 21:30    ASSESSMENT: Patient Active Problem List   Diagnosis Date Noted  . UTI (urinary tract infection) 08/19/2017  . UTI (urinary tract infection) during pregnancy 08/19/2017    PLAN: 1. UTI vs pyelonephritis vs kidney stone: -Continue IV antibiotics as ordered for 24h.  -Oral hydration.  -Urine culture pending from the office.  -WBC improved, afebrile, VSS -Strain all urine  2. Nausea: -Zofran ODT 91m q8h PRN -Continue regular diet  3. Pain: -6522mTyelenol q4h PRN -75m52mxycodone q4h PRN for breakthrough pain  4. Leakage of fluid: -Speculum exam negative for ROM. Discussed bladder leakage and increased vaginal discharge of pregnancy.   5. Fetal well-being: -Assess FHT by doppler q nursing shift.    Plan to reassess at 24 hours of IV antibiotics, at which point patient can be converted to PO antibiotics and discharged with outpatient follow-up if she remains stable. Discussed with Dr. BetBenjaman Kindlero is in agreement with this plan.     MarLisette GrinderNM 08/20/2017 12:56 PM  MarLisette Grinderrtified Nurse Midwife [facility]

## 2017-10-08 ENCOUNTER — Observation Stay
Admission: EM | Admit: 2017-10-08 | Discharge: 2017-10-09 | Disposition: A | Discharge status: Home / Self Care | Payer: Medicaid Other | Attending: Certified Nurse Midwife | Admitting: Certified Nurse Midwife

## 2017-10-08 DIAGNOSIS — Z881 Allergy status to other antibiotic agents status: Secondary | ICD-10-CM | POA: Diagnosis not present

## 2017-10-08 DIAGNOSIS — Z79899 Other long term (current) drug therapy: Secondary | ICD-10-CM | POA: Insufficient documentation

## 2017-10-08 DIAGNOSIS — M549 Dorsalgia, unspecified: Secondary | ICD-10-CM

## 2017-10-08 DIAGNOSIS — O99013 Anemia complicating pregnancy, third trimester: Secondary | ICD-10-CM | POA: Insufficient documentation

## 2017-10-08 DIAGNOSIS — O2303 Infections of kidney in pregnancy, third trimester: Secondary | ICD-10-CM | POA: Diagnosis not present

## 2017-10-08 DIAGNOSIS — Z3A34 34 weeks gestation of pregnancy: Secondary | ICD-10-CM | POA: Diagnosis not present

## 2017-10-08 DIAGNOSIS — O99891 Other specified diseases and conditions complicating pregnancy: Secondary | ICD-10-CM

## 2017-10-08 DIAGNOSIS — O9989 Other specified diseases and conditions complicating pregnancy, childbirth and the puerperium: Secondary | ICD-10-CM

## 2017-10-08 DIAGNOSIS — N136 Pyonephrosis: Secondary | ICD-10-CM | POA: Insufficient documentation

## 2017-10-08 DIAGNOSIS — D509 Iron deficiency anemia, unspecified: Secondary | ICD-10-CM | POA: Diagnosis not present

## 2017-10-08 DIAGNOSIS — N200 Calculus of kidney: Secondary | ICD-10-CM

## 2017-10-08 HISTORY — DX: Major depressive disorder, single episode, unspecified: F32.9

## 2017-10-08 HISTORY — DX: Depression, unspecified: F32.A

## 2017-10-08 HISTORY — DX: Personal history of other mental and behavioral disorders: Z86.59

## 2017-10-08 LAB — URINALYSIS, ROUTINE W REFLEX MICROSCOPIC
BILIRUBIN URINE: NEGATIVE
GLUCOSE, UA: NEGATIVE mg/dL
Hgb urine dipstick: NEGATIVE
KETONES UR: NEGATIVE mg/dL
Nitrite: NEGATIVE
PH: 7 (ref 5.0–8.0)
Protein, ur: NEGATIVE mg/dL
Specific Gravity, Urine: 1.012 (ref 1.005–1.030)

## 2017-10-08 LAB — WET PREP, GENITAL
CLUE CELLS WET PREP: NONE SEEN
Sperm: NONE SEEN
TRICH WET PREP: NONE SEEN
YEAST WET PREP: NONE SEEN

## 2017-10-08 MED ORDER — LACTATED RINGERS IV SOLN: Freq: Once | INTRAVENOUS | Status: DC

## 2017-10-08 MED ORDER — OXYCODONE-ACETAMINOPHEN 5-325 MG PO TABS: 1.0000 | ORAL_TABLET | ORAL | Status: DC | PRN

## 2017-10-08 MED ORDER — LORAZEPAM 0.5 MG PO TABS
0.5000 mg | ORAL_TABLET | Freq: Once | ORAL | Status: AC
  Administered 2017-10-09: 0.5 mg via ORAL
  Filled 2017-10-08: qty 1

## 2017-10-08 MED ORDER — CYCLOBENZAPRINE HCL 5 MG PO TABS
7.5000 mg | ORAL_TABLET | Freq: Once | ORAL | Status: AC
  Administered 2017-10-08: 7.5 mg via ORAL
  Filled 2017-10-08: qty 1.5

## 2017-10-08 MED ORDER — LACTATED RINGERS IV BOLUS
500.0000 mL | Freq: Once | INTRAVENOUS | Status: AC
  Administered 2017-10-09: 500 mL via INTRAVENOUS

## 2017-10-08 NOTE — Progress Notes (Signed)
Pt arrived to [facility] triage from home with c/o of lower back and Lt side flank area pain that comes and goes, rates 8/10 at worst, "pain was so bad it took my breath away", lower abdominal discomfort like menstrual cramps and milky with yellow tinged discharge x3 days. Says she work at [employer], has been busy the last few days with graduation stuff. Not certain she has been hydrating well enough. Still experiencing nausea with dry heaving for the past 3 days but no vomiting. Was told to stop macrobid since she did not have a bladder inf. Hx chronic UTI since age 8 yrs.

## 2017-10-08 NOTE — OB Triage Note (Signed)
C/o mainly Lt side lower back pain in one area, denies pain in Rt lower back.

## 2017-10-08 NOTE — Progress Notes (Signed)
Pt lying awake in bed, calmly texting on phone, when asked, states her pain is about 7/10, lower back "like in a band, across her back and cramping pain in pelvis, rates 6/10, "not unbearable but really uncomfortable"

## 2017-10-09 ENCOUNTER — Observation Stay: Payer: Medicaid Other

## 2017-10-09 DIAGNOSIS — O2303 Infections of kidney in pregnancy, third trimester: Secondary | ICD-10-CM | POA: Diagnosis not present

## 2017-10-09 LAB — CBC WITH DIFFERENTIAL/PLATELET
Band Neutrophils: 5 %
Basophils Absolute: 0 K/uL (ref 0–0.1)
Basophils Relative: 0 %
Blasts: 0 %
Eosinophils Absolute: 0.2 K/uL (ref 0–0.7)
Eosinophils Relative: 1 %
HCT: 29.9 % — ABNORMAL LOW (ref 35.0–47.0)
Hemoglobin: 10.5 g/dL — ABNORMAL LOW (ref 12.0–16.0)
Lymphocytes Relative: 13 %
Lymphs Abs: 2.1 K/uL (ref 1.0–3.6)
MCH: 31.3 pg (ref 26.0–34.0)
MCHC: 35 g/dL (ref 32.0–36.0)
MCV: 89.5 fL (ref 80.0–100.0)
Metamyelocytes Relative: 1 %
Monocytes Absolute: 0.5 K/uL (ref 0.2–0.9)
Monocytes Relative: 3 %
Myelocytes: 1 %
Neutro Abs: 13 K/uL — ABNORMAL HIGH (ref 1.4–6.5)
Neutrophils Relative %: 76 %
Other: 0 %
Platelets: 223 K/uL (ref 150–440)
Promyelocytes Relative: 0 %
RBC: 3.34 MIL/uL — ABNORMAL LOW (ref 3.80–5.20)
RDW: 14.2 % (ref 11.5–14.5)
WBC: 15.8 K/uL — ABNORMAL HIGH (ref 3.6–11.0)
nRBC: 0 /100{WBCs}

## 2017-10-09 LAB — FETAL FIBRONECTIN: Fetal Fibronectin: NEGATIVE

## 2017-10-09 LAB — BASIC METABOLIC PANEL
Anion gap: 7 (ref 5–15)
BUN: 9 mg/dL (ref 6–20)
CALCIUM: 8.7 mg/dL — AB (ref 8.9–10.3)
CO2: 24 mmol/L (ref 22–32)
CREATININE: 0.5 mg/dL (ref 0.44–1.00)
Chloride: 107 mmol/L (ref 101–111)
GFR calc Af Amer: >60 mL/min (ref >60)
GFR calc non Af Amer: >60 mL/min (ref >60)
Glucose, Bld: 106 mg/dL — ABNORMAL HIGH (ref 65–99)
Potassium: 3.7 mmol/L (ref 3.5–5.1)
SODIUM: 138 mmol/L (ref 135–145)

## 2017-10-09 MED ORDER — VITAMIN C 500 MG PO TABS: 500.0000 mg | ORAL_TABLET | Freq: Every day | ORAL | 0 refills | Status: DC

## 2017-10-09 MED ORDER — TAMSULOSIN HCL 0.4 MG PO CAPS
0.4000 mg | ORAL_CAPSULE | Freq: Every day | ORAL | Status: DC
  Filled 2017-10-09: qty 1

## 2017-10-09 MED ORDER — CYCLOBENZAPRINE HCL 5 MG PO TABS: 5.0000 mg | ORAL_TABLET | Freq: Two times a day (BID) | ORAL | 0 refills | Status: DC | PRN

## 2017-10-09 MED ORDER — FERROUS SULFATE 325 (65 FE) MG PO TABS: 325.0000 mg | ORAL_TABLET | Freq: Every day | ORAL | 0 refills | Status: DC

## 2017-10-09 MED ORDER — ACETAMINOPHEN 325 MG PO TABS: 650.0000 mg | ORAL_TABLET | ORAL | 0 refills | Status: DC | PRN

## 2017-10-09 MED ORDER — CYCLOBENZAPRINE HCL 5 MG PO TABS: 10.0000 mg | ORAL_TABLET | Freq: Two times a day (BID) | ORAL | 0 refills | Status: DC | PRN

## 2017-10-09 MED ORDER — NITROFURANTOIN MONOHYD MACRO 100 MG PO CAPS: 100.0000 mg | ORAL_CAPSULE | Freq: Every day | ORAL | 0 refills | Status: DC

## 2017-10-09 MED ORDER — CYCLOBENZAPRINE HCL 10 MG PO TABS
10.0000 mg | ORAL_TABLET | Freq: Once | ORAL | Status: AC
  Administered 2017-10-09: 10 mg via ORAL
  Filled 2017-10-09: qty 1

## 2017-10-09 MED ORDER — LACTATED RINGERS IV SOLN
INTRAVENOUS | Status: DC
  Administered 2017-10-09: 07:00:00 via INTRAVENOUS

## 2017-10-09 MED ORDER — HYDROMORPHONE HCL 1 MG/ML IJ SOLN: 0.5000 mg | INTRAMUSCULAR | Status: DC | PRN

## 2017-10-09 NOTE — H&P (Addendum)
TRIAGE NOTE to rule out Preterm Labor   History of Present Illness: Tiffany Whitehead is a 27 y.o. G2P0010 at [redacted]w[redacted]d presenting to triage for preterm right sided back pain just at CVA. Present off and on through pregnancy, on Friday was an 8/10 and took her breath away.  She was seen at urgent care 1 week ago with similar sx and treated for a UTI, though she says that her urine wasn't consistent with infection, but her sx were. She was given Macrobid.  She is having intermittent cramping and lower abdominal pain. She was on her feet a lot today at work for graduation, but tried to stay hydrated.  Patient Active Problem List   Diagnosis Date Noted  . Back pain complicating pregnancy 10/08/2017  . UTI (urinary tract infection) 08/19/2017  . UTI (urinary tract infection) during pregnancy 08/19/2017    Past Medical History:  Diagnosis Date  . Anxiety   . Depression   . H/O borderline personality disorder    stopped meds at beginning of pregnancy, Lamictal and Vyvanse  . Medical history non-contributory     Past Surgical History:  Procedure Laterality Date  . COLONOSCOPY    . extraction of wisdom teeth      OB History  Gravida Para Term Preterm AB Living  2       1    SAB TAB Ectopic Multiple Live Births  1            # Outcome Date GA Lbr Len/2nd Weight Sex Delivery Anes PTL Lv  2 Current           1 SAB             Social History   Socioeconomic History  . Marital status: Single    Spouse name: Not on file  . Number of children: Not on file  . Years of education: Not on file  . Highest education level: Not on file  Occupational History  . Not on file  Social Needs  . Financial resource strain: Not on file  . Food insecurity:    Worry: Not on file    Inability: Not on file  . Transportation needs:    Medical: Not on file    Non-medical: Not on file  Tobacco Use  . Smoking status: Never Smoker  . Smokeless tobacco: Never Used  Substance and Sexual Activity  .  Alcohol use: Not Currently    Comment: social  . Drug use: No  . Sexual activity: Yes    Birth control/protection: Pill  Lifestyle  . Physical activity:    Days per week: Not on file    Minutes per session: Not on file  . Stress: Not on file  Relationships  . Social connections:    Talks on phone: Not on file    Gets together: Not on file    Attends religious service: Not on file    Active member of club or organization: Not on file    Attends meetings of clubs or organizations: Not on file    Relationship status: Not on file  Other Topics Concern  . Not on file  Social History Narrative  . Not on file    Family History  Problem Relation Age of Onset  . Cancer Mother   . Cancer Other     Allergies  Allergen Reactions  . Ceclor [Cefaclor] Rash    Medications Prior to Admission  Medication Sig Dispense Refill Last Dose  .  acetaminophen (TYLENOL) 325 MG tablet Take 2 tablets (650 mg total) by mouth every 4 (four) hours as needed for mild pain or moderate pain.   10/07/2017 at Unknown time  . cyclobenzaprine (FLEXERIL) 10 MG tablet Take 1 tablet (10 mg total) by mouth 2 (two) times daily as needed for muscle spasms. 20 tablet 1 Past Week at Unknown time  . oxyCODONE (OXY IR/ROXICODONE) 5 MG immediate release tablet Take 1 tablet (5 mg total) by mouth every 6 (six) hours as needed for breakthrough pain. 5 tablet 0 Past Month at Unknown time  . Prenatal Vit-Fe Fumarate-FA (PRENATAL MULTIVITAMIN) TABS tablet Take 1 tablet by mouth daily at 12 noon.   10/08/2017 at Unknown time  . Promethazine HCl (PHENERGAN PO) Take 1 tablet by mouth daily.   Past Week at Unknown time  . LORazepam (ATIVAN) 0.5 MG tablet Take 0.5 mg by mouth every 8 (eight) hours.   Not Taking at Unknown time    Review of Systems - See HPI for OB specific ROS.   Vitals:  BP (!) 111/59 (BP Location: Left Arm)   Pulse 73   Temp 97.9 F (36.6 C) (Oral)   Resp 16   Ht  (1.727 m)   Wt 83 kg (183 lb)   LMP  02/13/2017   BMI 27.83 kg/m  Physical Examination: Per nursing staff, guarding over right CVA, otherwise in no acute distress  Cervix: 1/thick/high Membranes:intact Fetal Monitoring:Baseline: 135 bpm, Variability: Good {> 6 bpm), Accelerations: Reactive and Decelerations: Absent Tocometer: Flat  Labs:  Results for orders placed or performed during the hospital encounter of 10/08/17 (from the past 24 hour(s))  Wet prep, genital   Collection Time: 10/08/17 10:34 PM  Result Value Ref Range   Yeast Wet Prep HPF POC NONE SEEN NONE SEEN   Trich, Wet Prep NONE SEEN NONE SEEN   Clue Cells Wet Prep HPF POC NONE SEEN NONE SEEN   WBC, Wet Prep HPF POC MODERATE (A) NONE SEEN   Sperm NONE SEEN   Urinalysis, Routine w reflex microscopic   Collection Time: 10/08/17 10:34 PM  Result Value Ref Range   Color, Urine YELLOW (A) YELLOW   APPearance CLOUDY (A) CLEAR   Specific Gravity, Urine 1.012 1.005 - 1.030   pH 7.0 5.0 - 8.0   Glucose, UA NEGATIVE NEGATIVE mg/dL   Hgb urine dipstick NEGATIVE NEGATIVE   Bilirubin Urine NEGATIVE NEGATIVE   Ketones, ur NEGATIVE NEGATIVE mg/dL   Protein, ur NEGATIVE NEGATIVE mg/dL   Nitrite NEGATIVE NEGATIVE   Leukocytes, UA TRACE (A) NEGATIVE   RBC / HPF 0-5 0 - 5 RBC/hpf   WBC, UA 6-10 0 - 5 WBC/hpf   Bacteria, UA RARE (A) NONE SEEN   Squamous Epithelial / LPF 0-5 0 - 5   Amorphous Crystal PRESENT   Fetal fibronectin   Collection Time: 10/08/17 11:05 PM  Result Value Ref Range   Fetal Fibronectin NEGATIVE NEGATIVE   Appearance, FETFIB CLEAR CLEAR  Basic metabolic panel   Collection Time: 10/09/17  1:11 AM  Result Value Ref Range   Sodium 138 135 - 145 mmol/L   Potassium 3.7 3.5 - 5.1 mmol/L   Chloride 107 101 - 111 mmol/L   CO2 24 22 - 32 mmol/L   Glucose, Bld 106 (H) 65 - 99 mg/dL   BUN 9 6 - 20 mg/dL   Creatinine, Ser 7.82 0.44 - 1.00 mg/dL   Calcium 8.7 (L) 8.9 - 10.3 mg/dL   GFR calc  non Af Amer >60 >60 mL/min   GFR calc Af Amer >60 >60  mL/min   Anion gap 7 5 - 15  CBC with Differential/Platelet   Collection Time: 10/09/17  1:11 AM  Result Value Ref Range   WBC 15.8 (H) 3.6 - 11.0 K/uL   RBC 3.34 (L) 3.80 - 5.20 MIL/uL   Hemoglobin 10.5 (L) 12.0 - 16.0 g/dL   HCT 78.2 (L) 95.6 - 21.3 %   MCV 89.5 80.0 - 100.0 fL   MCH 31.3 26.0 - 34.0 pg   MCHC 35.0 32.0 - 36.0 g/dL   RDW 08.6 57.8 - 46.9 %   Platelets 223 150 - 440 K/uL   Neutrophils Relative % 76 %   Lymphocytes Relative 13 %   Monocytes Relative 3 %   Eosinophils Relative 1 %   Basophils Relative 0 %   Band Neutrophils 5 %   Metamyelocytes Relative 1 %   Myelocytes 1 %   Promyelocytes Relative 0 %   Blasts 0 %   nRBC 0 0 /100 WBC   Other 0 %   Neutro Abs 13.0 (H) 1.4 - 6.5 K/uL   Lymphs Abs 2.1 1.0 - 3.6 K/uL   Monocytes Absolute 0.5 0.2 - 0.9 K/uL   Eosinophils Absolute 0.2 0 - 0.7 K/uL   Basophils Absolute 0.0 0 - 0.1 K/uL   Smear Review MORPHOLOGY UNREMARKABLE     Imaging Studies: US Renal  Result Date: 10/09/2017 CLINICAL DATA:  Left flank pain [redacted] week pregnant patient EXAM: RENAL / URINARY TRACT ULTRASOUND COMPLETE COMPARISON:  Ultrasound 08/19/2017 FINDINGS: Right Kidney: Length: 12.2 cm. Cortical echogenicity within normal limits. Moderate right hydronephrosis, unchanged. Proximal hydroureter. Possible 5 mm echogenic stone mid pole right kidney. Left Kidney: Length: 10.9 cm. Cortical echogenicity within normal limits. Interim finding of mild left hydronephrosis, most prominent at the lower pole. Bladder: Appears normal for degree of bladder distention. IMPRESSION: 1. No significant interval change in moderate right hydronephrosis and proximal hydroureter. Possible 5 mm stone mid right kidney 2. Interim finding of mild left hydronephrosis Electronically Signed   By: Jasmine Pang M.D.   On: 10/09/2017 01:21     Assessment and Plan: Patient Active Problem List   Diagnosis Date Noted  . Back pain complicating pregnancy 10/08/2017  . UTI (urinary  tract infection) 08/19/2017  . UTI (urinary tract infection) during pregnancy 08/19/2017   27yo G2P0010 at 34+0wks with flank pain, preterm contractions and hx of UTIs.  1. Preterm contractions: - FFN negative - Cervix not laboring - membranes intact - tocometer quiet - no evidence of vaginal infection  2. Flank pain - improved with IVF, flexeril and BZD - moderate right hydronephrosis and hydroureter appear to be chronic, as April 2019 ultrasound showed similar result. Possible non-obstructing stone in right kidney. Urine with small bacteria.Consider suppressive macrobid through remainder of pregnancy. Consider outpatient urology f/u for review of history. - wet prep neg for BV and yeast - No trichomonas noted  3. Leukocytosis - frequently elevated through this pregnancy, and possibility related to her frequent UTIs. As she is afebrile and otherwise asx this morning, I do not see another source of infection.  4. Iron deficiency anemia - continue po ferrous sulfate and ascorbic acid  5. Fetal wellbeing - Cat I strip, reassuring  Cline Cools, MD, MPH

## 2017-10-09 NOTE — Progress Notes (Signed)
Patient verbalized understanding of discharge instructions.  ?

## 2017-10-09 NOTE — Progress Notes (Signed)
Pt refusing to have 2nd PIV attempted until she received a dose of Ativan. RN down to pharmacy and back on unit with med to administer.

## 2017-10-09 NOTE — Progress Notes (Signed)
2300 - pt becoming upset and tearful, asking her mother to leave room. Pt mother decides to remain in room and says her daughter has asked her to leave because she does not want to ask for herself but she wants an ultrasound done to see if baby is facing the opposite way because she was told in clinic by Claris Che that if the baby was facing the opposite way, this could be the reason for her back pain. Discussed reasons for needing ultrasound and pt states she has pregnancy medicaid and it should cover her getting an ultrasound.

## 2017-10-09 NOTE — Discharge Summary (Addendum)
Antepartum Discharge Summary   Patient ID: Patient Name: Tiffany Whitehead DOB: 03-26-1991 MRN: [id number]  Date of Admission: 10/08/2017 Date of Discharge: 10/09/2017  Primary OB: [facility]  [id number] last menstrual period was 02/13/2017. EDC Estimated Date of Delivery: 11/20/17 Gestational Age at Delivery: [redacted]w[redacted]d   Admitting Diagnosis: Right sided back pain  Secondary Diagnoses: Patient Active Problem List   Diagnosis Date Noted  . Back pain complicating pregnancy 10/08/2017  . UTI (urinary tract infection) 08/19/2017  . UTI (urinary tract infection) during pregnancy 08/19/2017    Subjective:  Tiffany Whitehead reports that the pain she came in with is feeling better. She does have occasional left-sided lower back pain that comes occasionally and resolved on its own.   She reports:  -active fetal movement -no leakage of fluid -no vaginal bleeding -no contractions   Objective: Labs: Results for orders placed or performed during the hospital encounter of 10/08/17 (from the past 168 hour(s))  Wet prep, genital   Collection Time: 10/08/17 10:34 PM  Result Value Ref Range   Yeast Wet Prep HPF POC NONE SEEN NONE SEEN   Trich, Wet Prep NONE SEEN NONE SEEN   Clue Cells Wet Prep HPF POC NONE SEEN NONE SEEN   WBC, Wet Prep HPF POC MODERATE (A) NONE SEEN   Sperm NONE SEEN   Urinalysis, Routine w reflex microscopic   Collection Time: 10/08/17 10:34 PM  Result Value Ref Range   Color, Urine YELLOW (A) YELLOW   APPearance CLOUDY (A) CLEAR   Specific Gravity, Urine 1.012 1.005 - 1.030   pH 7.0 5.0 - 8.0   Glucose, UA NEGATIVE NEGATIVE mg/dL   Hgb urine dipstick NEGATIVE NEGATIVE   Bilirubin Urine NEGATIVE NEGATIVE   Ketones, ur NEGATIVE NEGATIVE mg/dL   Protein, ur NEGATIVE NEGATIVE mg/dL   Nitrite NEGATIVE NEGATIVE   Leukocytes, UA TRACE (A) NEGATIVE   RBC / HPF 0-5 0 - 5 RBC/hpf   WBC, UA 6-10 0 - 5 WBC/hpf   Bacteria, UA RARE (A) NONE SEEN   Squamous Epithelial / LPF  0-5 0 - 5   Amorphous Crystal PRESENT   Fetal fibronectin   Collection Time: 10/08/17 11:05 PM  Result Value Ref Range   Fetal Fibronectin NEGATIVE NEGATIVE   Appearance, FETFIB CLEAR CLEAR  Basic metabolic panel   Collection Time: 10/09/17  1:11 AM  Result Value Ref Range   Sodium 138 135 - 145 mmol/L   Potassium 3.7 3.5 - 5.1 mmol/L   Chloride 107 101 - 111 mmol/L   CO2 24 22 - 32 mmol/L   Glucose, Bld 106 (H) 65 - 99 mg/dL   BUN 9 6 - 20 mg/dL   Creatinine, Ser 6.57 0.44 - 1.00 mg/dL   Calcium 8.7 (L) 8.9 - 10.3 mg/dL   GFR calc non Af Amer >60 >60 mL/min   GFR calc Af Amer >60 >60 mL/min   Anion gap 7 5 - 15  CBC with Differential/Platelet   Collection Time: 10/09/17  1:11 AM  Result Value Ref Range   WBC 15.8 (H) 3.6 - 11.0 K/uL   RBC 3.34 (L) 3.80 - 5.20 MIL/uL   Hemoglobin 10.5 (L) 12.0 - 16.0 g/dL   HCT 84.6 (L) 96.2 - 95.2 %   MCV 89.5 80.0 - 100.0 fL   MCH 31.3 26.0 - 34.0 pg   MCHC 35.0 32.0 - 36.0 g/dL   RDW 84.1 32.4 - 40.1 %   Platelets 223 150 - 440 K/uL   Neutrophils  Relative % 76 %   Lymphocytes Relative 13 %   Monocytes Relative 3 %   Eosinophils Relative 1 %   Basophils Relative 0 %   Band Neutrophils 5 %   Metamyelocytes Relative 1 %   Myelocytes 1 %   Promyelocytes Relative 0 %   Blasts 0 %   nRBC 0 0 /100 WBC   Other 0 %   Neutro Abs 13.0 (H) 1.4 - 6.5 K/uL   Lymphs Abs 2.1 1.0 - 3.6 K/uL   Monocytes Absolute 0.5 0.2 - 0.9 K/uL   Eosinophils Absolute 0.2 0 - 0.7 K/uL   Basophils Absolute 0.0 0 - 0.1 K/uL   Smear Review MORPHOLOGY UNREMARKABLE     US Renal  Result Date: 10/09/2017 CLINICAL DATA:  Left flank pain [redacted] week pregnant patient EXAM: RENAL / URINARY TRACT ULTRASOUND COMPLETE COMPARISON:  Ultrasound 08/19/2017 FINDINGS: Right Kidney: Length: 12.2 cm. Cortical echogenicity within normal limits. Moderate right hydronephrosis, unchanged. Proximal hydroureter. Possible 5 mm echogenic stone mid pole right kidney. Left Kidney: Length: 10.9  cm. Cortical echogenicity within normal limits. Interim finding of mild left hydronephrosis, most prominent at the lower pole. Bladder: Appears normal for degree of bladder distention. IMPRESSION: 1. No significant interval change in moderate right hydronephrosis and proximal hydroureter. Possible 5 mm stone mid right kidney 2. Interim finding of mild left hydronephrosis Electronically Signed   By: Jasmine Pang M.D.   On: 10/09/2017 01:21   Discharge Physical Exam:  BP (!) 111/59 (BP Location: Left Arm)   Pulse 73   Temp 97.9 F (36.6 C) (Oral)   Resp 16   Ht  (1.727 m)   Wt 83 kg (183 lb)   LMP 02/13/2017   BMI 27.83 kg/m   General: alert and no distress Pulm: normal respiratory effort, CTABL CV: RRR Back: no tenderness, symmetrical Abdomen: soft, non-distended, non-tender, gravid Extremities: No evidence of DVT seen on physical exam. No lower extremity edema.  FHT: Baseline 135bpm, moderate variability, 15x15 accels, no decels Toco: some uterine irritability, rare contractions  Hospital course:  Patient was placed in observation after presenting with right sided back pain right at CVA. She was treated with IVF, lorazepam for anxiety, and flexeril for pain. Renal ultrasound showed no significant change in moderate right hydronephrosis from April scan, possible 5mm stone, and mild left hydronephrosis. Her pain managed with PO medication and she was deemed stable for discharge to home.   Discharge Condition: Stable  Disposition: Discharge disposition: 01-Home or Self Care       Plan:  1. Rule out preterm labor:  - FFN negative - Cervix not laboring per overnight provider - Tocometer overall quiet - No evidence of vaginal infection  2. Flank pain: - Improved with IVF, flexeril and BZD - Moderate right hydronephrosis and hydroureter appear to be chronic, as April 2019 ultrasound showed similar result. Possible non-obstructing stone in right kidney. Urine with small  bacteria. Urine culture pending.  - Plan to start suppressive Macrobid  PO daily through remainder of pregnancy.  - Reviewed importance of hydration with patient.  - Prescription for Flexeril PRN sent to preferred pharmacy.  - Consider outpatient urology follow-upu for review of history. - Wet prep neg for BV and yeast, no trichomonas noted.   3. Leukocytosis: - Frequently elevated through this pregnancy, and possibility related to her frequent UTIs. As she is afebrile and otherwise asx this morning, there is no other apparent source of infection.  4. Iron deficiency anemia: -  Continue PO ferrous sulfate and ascorbic acid once daily.  5. Fetal wellbeing - Reactive and reassuring for gestational age - Follow-up as outpatient as scheduled   Discussed plan of care with Dr. Dalbert Garnet, who is in agreement with this plan.    Allergies as of 10/09/2017      Reactions   Ceclor [cefaclor] Rash      Medication List    STOP taking these medications   oxyCODONE 5 MG immediate release tablet Commonly known as:  Oxy IR/ROXICODONE     TAKE these medications   acetaminophen 325 MG tablet Commonly known as:  TYLENOL Take 2 tablets (650 mg total) by mouth every 4 (four) hours as needed for mild pain or moderate pain.   cyclobenzaprine 5 MG tablet Commonly known as:  FLEXERIL Take 1 tablet (5 mg total) by mouth 2 (two) times daily as needed (for muscle spasms or back pain). What changed:    medication strength  how much to take  reasons to take this   ferrous sulfate 325 (65 FE) MG tablet Take 1 tablet (325 mg total) by mouth daily.   LORazepam 0.5 MG tablet Commonly known as:  ATIVAN Take 0.5 mg by mouth every 8 (eight) hours.   nitrofurantoin (macrocrystal-monohydrate) 100 MG capsule Commonly known as:  MACROBID Take 1 capsule (100 mg total) by mouth at bedtime.   PHENERGAN PO Take 1 tablet by mouth daily.   prenatal multivitamin Tabs tablet Take 1 tablet by mouth  daily at 12 noon.   vitamin C 500 MG tablet Commonly known as:  ASCORBIC ACID Take 1 tablet (500 mg total) by mouth daily. with iron supplement      Follow-up Information    [facility] Follow up.   Why:  Follow-up as scheduled. Contact information: [address] [place] [address] [phone]          Signed:  [name] 10/09/2017 11:10 AM ----- [name], CNM Certified Nurse Midwife [facility]

## 2017-10-09 NOTE — Progress Notes (Addendum)
Spoke with Dr Dalbert Garnet, called to get a status update of how pt did overnight, pt received Flexeril 7.5mg  and Ativan 0.5mg  and has been asleep since meds and LR bolus completed. Explained pt recently up to restroom, voided, urine strained, no stones noted. Pt back in bed, EFM adjusted, pt remained drowsy, RN and mother in room to assist pt. When asked, pt reports no significant discomfort, returning to sleep without any complaints. Per MD, pt may eat breakfast this AM, will come see pt  this morning.

## 2017-10-09 NOTE — Discharge Instructions (Signed)
Back pain:  -Tylenol as needed for pain -Flexeril as needed for pain -Stay well hydrated (60-90 ounces of water a day) -Pregnancy support band/belt  Iron deficiency anemia: -  ferrous sulfate with  Vitamin C daily (or another iron supplement) -Increase iron-rich foods in your diet  History of urinary tract infections: -  Macrobid daily to prevent future infections

## 2017-10-10 LAB — URINE CULTURE

## 2017-10-27 ENCOUNTER — Observation Stay (HOSPITAL_COMMUNITY)
Admission: AD | Admit: 2017-10-27 | Discharge: 2017-10-28 | Disposition: A | Discharge status: Home / Self Care | Payer: Medicaid Other | Source: Ambulatory Visit | Attending: Obstetrics and Gynecology | Admitting: Obstetrics and Gynecology

## 2017-10-27 ENCOUNTER — Encounter (HOSPITAL_COMMUNITY): Payer: Self-pay | Admitting: *Deleted

## 2017-10-27 ENCOUNTER — Other Ambulatory Visit: Payer: Self-pay

## 2017-10-27 ENCOUNTER — Inpatient Hospital Stay (HOSPITAL_COMMUNITY): Payer: Medicaid Other

## 2017-10-27 DIAGNOSIS — O368190 Decreased fetal movements, unspecified trimester, not applicable or unspecified: Secondary | ICD-10-CM

## 2017-10-27 DIAGNOSIS — O99343 Other mental disorders complicating pregnancy, third trimester: Secondary | ICD-10-CM | POA: Insufficient documentation

## 2017-10-27 DIAGNOSIS — F419 Anxiety disorder, unspecified: Secondary | ICD-10-CM | POA: Insufficient documentation

## 2017-10-27 DIAGNOSIS — O368130 Decreased fetal movements, third trimester, not applicable or unspecified: Secondary | ICD-10-CM

## 2017-10-27 DIAGNOSIS — O368131 Decreased fetal movements, third trimester, fetus 1: Principal | ICD-10-CM | POA: Insufficient documentation

## 2017-10-27 DIAGNOSIS — F329 Major depressive disorder, single episode, unspecified: Secondary | ICD-10-CM | POA: Diagnosis not present

## 2017-10-27 DIAGNOSIS — Z3A36 36 weeks gestation of pregnancy: Secondary | ICD-10-CM | POA: Diagnosis not present

## 2017-10-27 HISTORY — DX: Urinary tract infection, site not specified: N39.0

## 2017-10-27 HISTORY — DX: Calculus of kidney: N20.0

## 2017-10-27 LAB — CBC
HCT: 33.7 % — ABNORMAL LOW (ref 36.0–46.0)
Hemoglobin: 11 g/dL — ABNORMAL LOW (ref 12.0–15.0)
MCH: 30.1 pg (ref 26.0–34.0)
MCHC: 32.6 g/dL (ref 30.0–36.0)
MCV: 92.1 fL (ref 78.0–100.0)
Platelets: 260 10*3/uL (ref 150–400)
RBC: 3.66 MIL/uL — ABNORMAL LOW (ref 3.87–5.11)
RDW: 14.6 % (ref 11.5–15.5)
WBC: 17.1 10*3/uL — ABNORMAL HIGH (ref 4.0–10.5)

## 2017-10-27 LAB — TYPE AND SCREEN
ABO/RH(D): A POS
Antibody Screen: NEGATIVE

## 2017-10-27 IMAGING — US US MFM FETAL BPP W/O NON-STRESS
1 series · 10 of 10 positions shown · non-contrast
Comparison: none

[Series 1: us mfm fetal bpp w/o non-stress · 10 acquisitions, 10 frames shown]
[im 1/10]
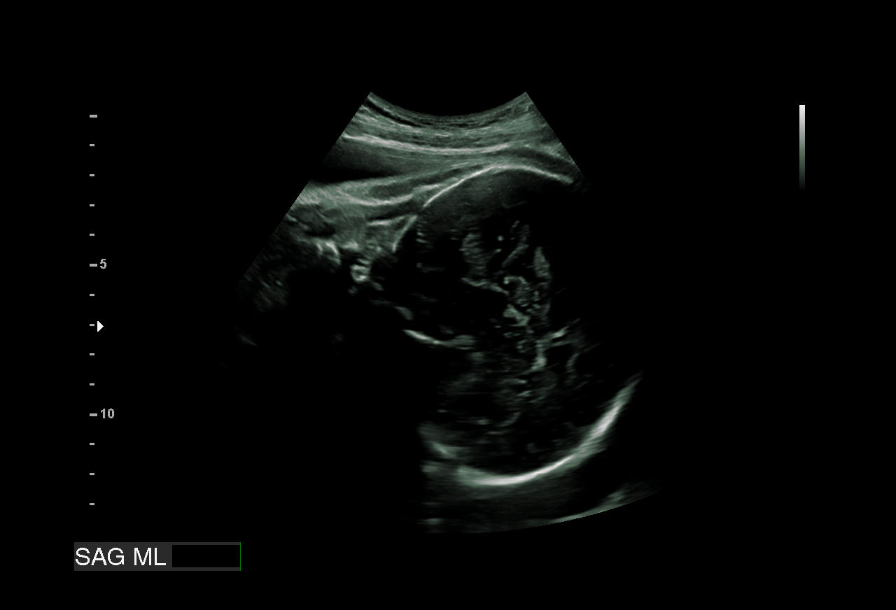
[im 2/10]
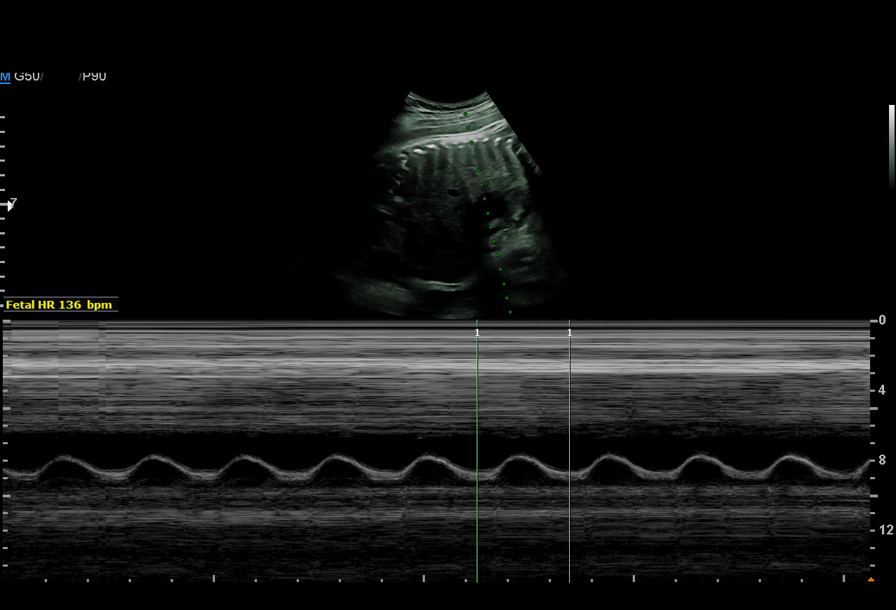
[im 3/10]
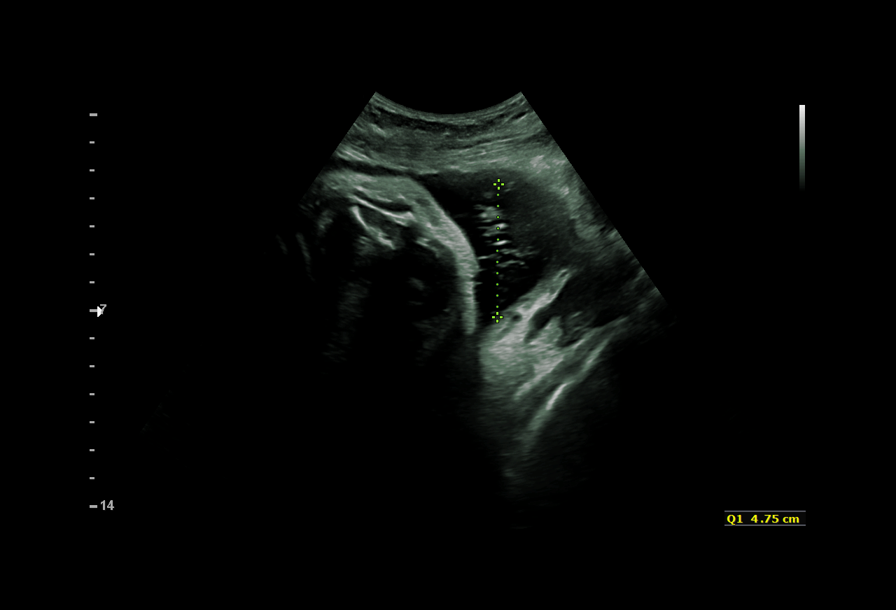
[im 4/10]
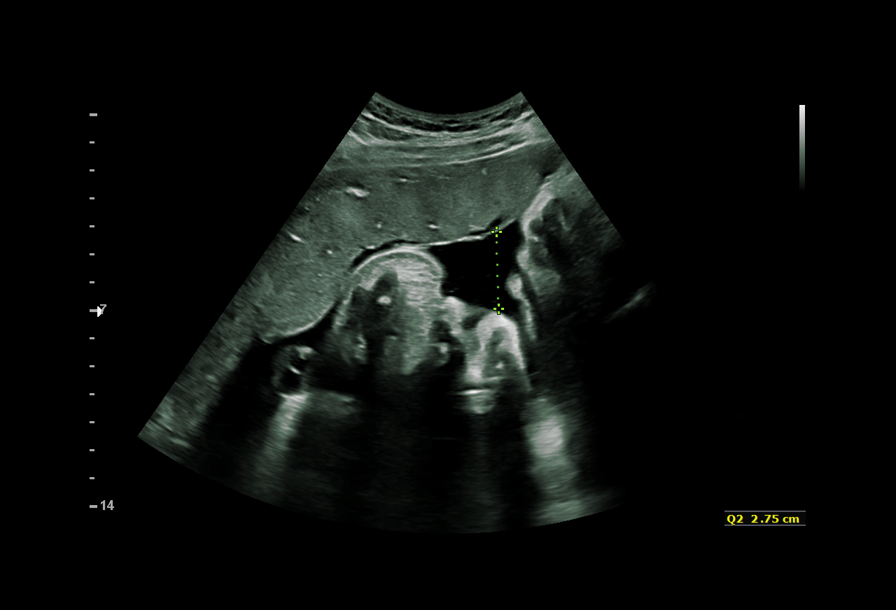
[im 5/10]
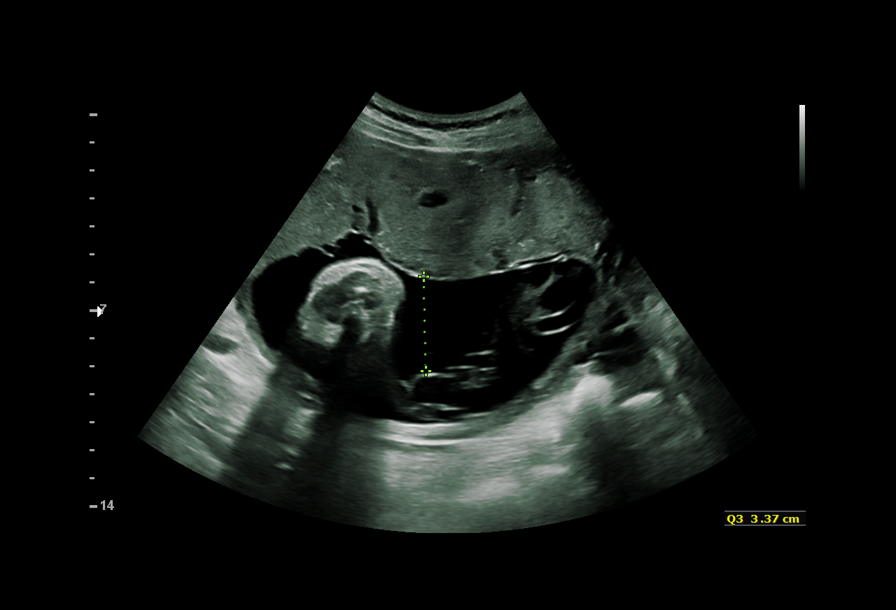
[im 6/10]
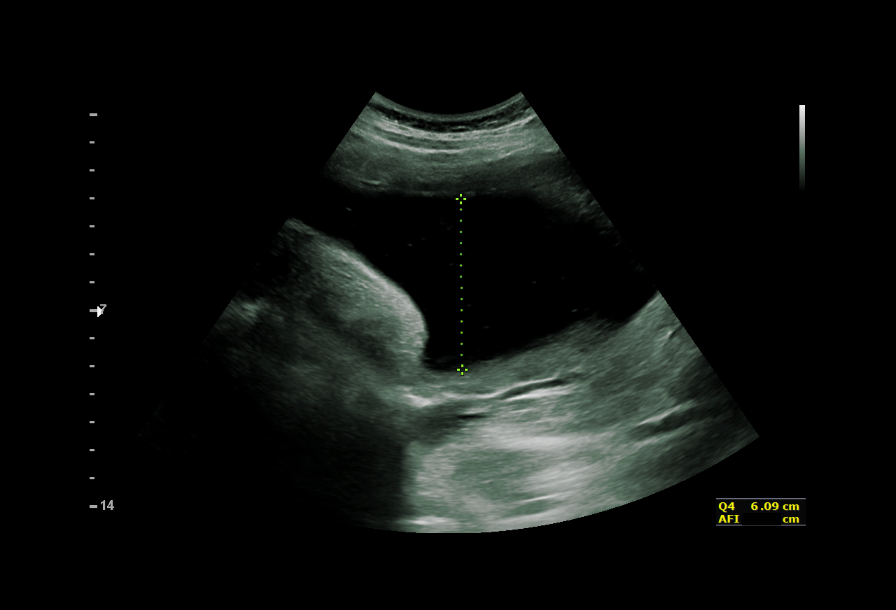
[im 7/10]
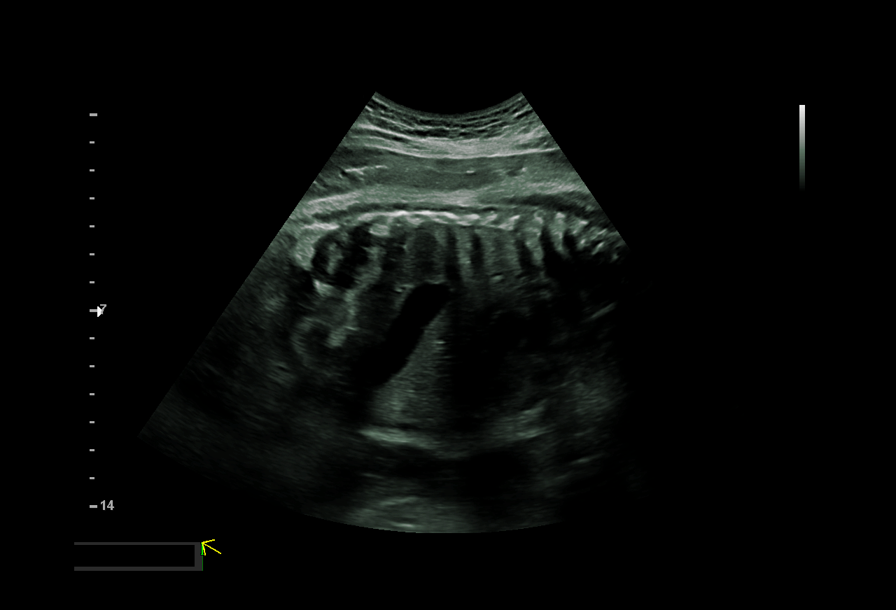
[im 8/10]
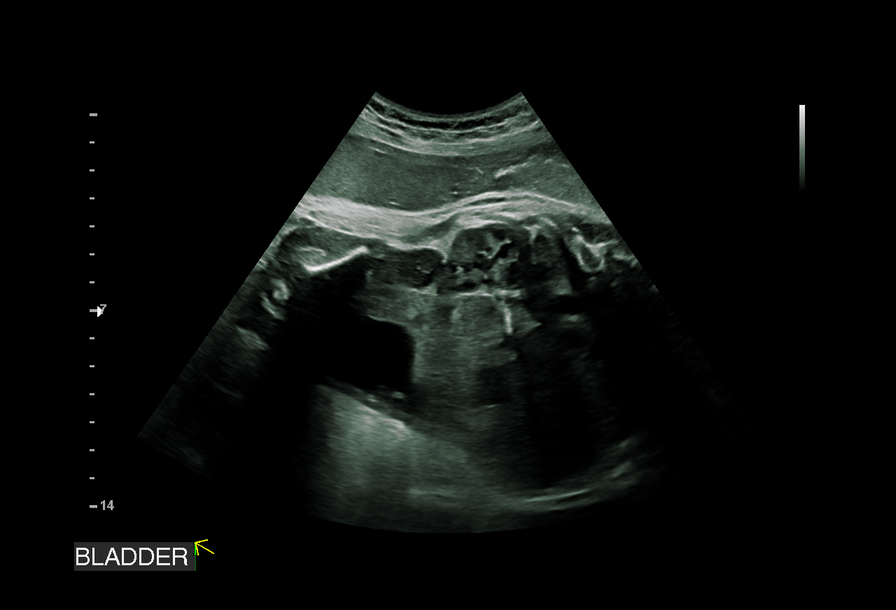
[im 9/10]
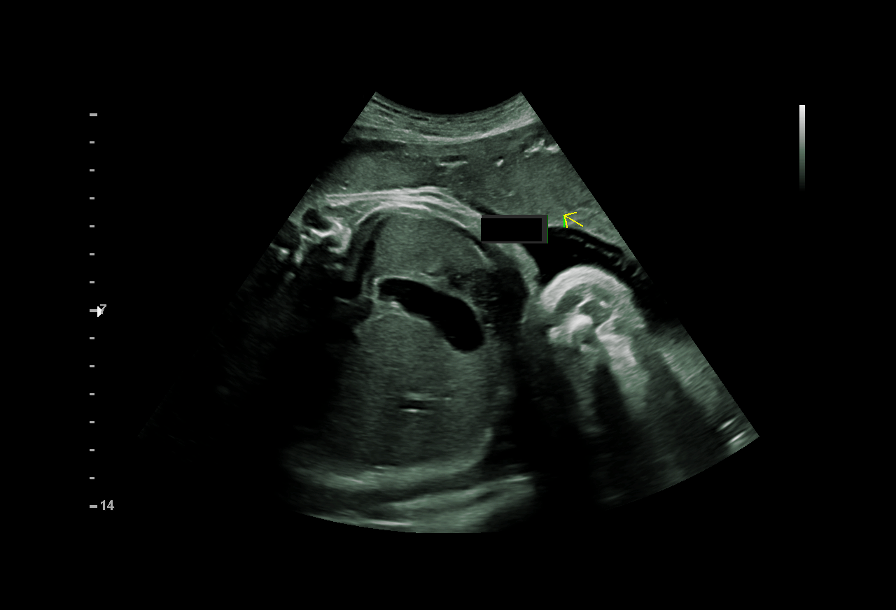
[im 10/10]
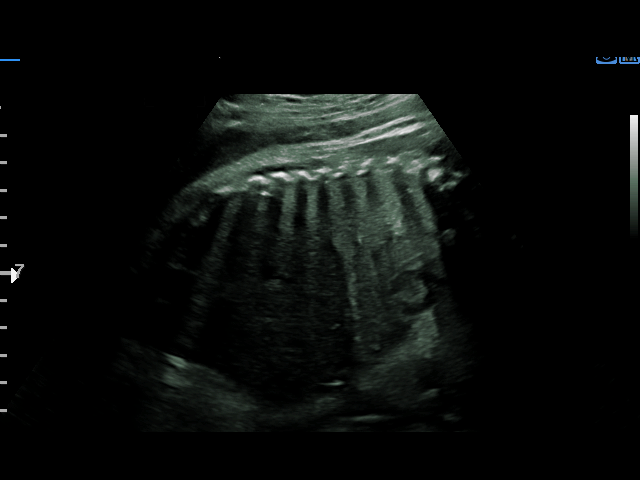

[10 of 10 positions shown; findings below may reference images not displayed]

Indications

36 weeks gestation of pregnancy
Decreased fetal movement                       [QN]
Reactive NST
OB History

Blood Type:            Height:  5'8"   Weight (lb):  156       BMI:
Gravidity:    2         Term:   0        Prem:   0        SAB:   1
TOP:          0       Ectopic:  0        Living: 0
Fetal Evaluation

Num Of Fetuses:     1
Fetal Heart         136
Rate(bpm):
Cardiac Activity:   Observed
Presentation:       Cephalic

Amniotic Fluid
AFI FV:      Subjectively within normal limits

AFI Sum(cm)     %Tile       Largest Pocket(cm)
16.96           64

RUQ(cm)       RLQ(cm)       LUQ(cm)        LLQ(cm)
4.75
Biophysical Evaluation

Amniotic F.V:   Within normal limits       F. Tone:        Not Observed
F. Movement:    Not Observed               N.S.T:          Reactive
F. Breathing:   Observed                   Score:          [DATE]
Gestational Age
LMP:           37w 5d        Date:  [DATE]                 EDD:   [DATE]
Clinical EDD:  36w 5d                                        EDD:   [DATE]
Best:          36w 5d     Det. By:  Clinical EDD             EDD:   [DATE]
Anatomy

Stomach:               Appears normal, left   Bladder:                Appears normal
sided
Cervix Uterus Adnexa

Cervix
Not visualized (advanced GA >[QN])
Impression

SIUP at 36+5 weeks
Cephalic presentation
Normal amniotic fluid volume
BPP [DATE] (-2 for movement and tone)
Recommendations

Correlate with clinical scenario

## 2017-10-27 MED ORDER — PRENATAL MULTIVITAMIN CH: 1.0000 | ORAL_TABLET | Freq: Every day | ORAL | Status: DC

## 2017-10-27 MED ORDER — ZOLPIDEM TARTRATE 5 MG PO TABS: 5.0000 mg | ORAL_TABLET | Freq: Every evening | ORAL | Status: DC | PRN

## 2017-10-27 MED ORDER — CALCIUM CARBONATE ANTACID 500 MG PO CHEW: 2.0000 | CHEWABLE_TABLET | ORAL | Status: DC | PRN

## 2017-10-27 MED ORDER — ACETAMINOPHEN 325 MG PO TABS: 650.0000 mg | ORAL_TABLET | ORAL | Status: DC | PRN

## 2017-10-27 MED ORDER — LACTATED RINGERS IV SOLN
INTRAVENOUS | Status: DC
  Administered 2017-10-27: 22:00:00 via INTRAVENOUS
  Administered 2017-10-28: 125 mL/h via INTRAVENOUS

## 2017-10-27 MED ORDER — DOCUSATE SODIUM 100 MG PO CAPS: 100.0000 mg | ORAL_CAPSULE | Freq: Every day | ORAL | Status: DC

## 2017-10-27 NOTE — Progress Notes (Signed)
Report given to [place], RN HROB.  Yellow top sent to Lab.

## 2017-10-27 NOTE — Plan of Care (Signed)
  Problem: Safety: Goal: Ability to remain free from injury will improve Outcome: Progressing   Problem: Education: Goal: Knowledge of disease or condition will improve Outcome: Progressing   

## 2017-10-27 NOTE — MAU Provider Note (Addendum)
History   [id number]   Chief Complaint  Patient presents with  . Decreased Fetal Movement    HPI Tiffany Whitehead is a 27 y.o. female  G2P0010 here with report of decreased fetal movement since last nigth.  Reports feeling the baby move approximately 0 times in the past 24 hour.  Denies vaginal bleeding or leaking of fluid.  Denies abdominal pain. States she just started feeling regular movement last week.   Patient's last menstrual period was 02/13/2017.  OB History  Gravida Para Term Preterm AB Living  2       1    SAB TAB Ectopic Multiple Live Births  1            # Outcome Date GA Lbr Len/2nd Weight Sex Delivery Anes PTL Lv  2 Current           1 SAB             Past Medical History:  Diagnosis Date  . Anxiety   . Depression   . H/O borderline personality disorder    stopped meds at beginning of pregnancy, Lamictal and Vyvanse  . Kidney stone   . UTI (urinary tract infection)     Family History  Problem Relation Age of Onset  . Cancer Mother   . Cancer Other     Social History   Socioeconomic History  . Marital status: Married    Spouse name: Not on file  . Number of children: Not on file  . Years of education: Not on file  . Highest education level: Not on file  Occupational History  . Not on file  Social Needs  . Financial resource strain: Not on file  . Food insecurity:    Worry: Not on file    Inability: Not on file  . Transportation needs:    Medical: Not on file    Non-medical: Not on file  Tobacco Use  . Smoking status: Never Smoker  . Smokeless tobacco: Never Used  Substance and Sexual Activity  . Alcohol use: Not Currently    Comment: social  . Drug use: No  . Sexual activity: Yes    Birth control/protection: Pill  Lifestyle  . Physical activity:    Days per week: Not on file    Minutes per session: Not on file  . Stress: Not on file  Relationships  . Social connections:    Talks on phone: Not on file    Gets together: Not on file     Attends religious service: Not on file    Active member of club or organization: Not on file    Attends meetings of clubs or organizations: Not on file    Relationship status: Not on file  Other Topics Concern  . Not on file  Social History Narrative  . Not on file    Allergies  Allergen Reactions  . Ceclor [Cefaclor] Rash    No current facility-administered medications on file prior to encounter.    Current Outpatient Medications on File Prior to Encounter  Medication Sig Dispense Refill  . acetaminophen (TYLENOL) 325 MG tablet Take 2 tablets (650 mg total) by mouth every 4 (four) hours as needed for mild pain or moderate pain. 90 tablet 0  . cyclobenzaprine (FLEXERIL) 5 MG tablet Take 1 tablet (5 mg total) by mouth 2 (two) times daily as needed (for muscle spasms or back pain). 20 tablet 0  . ferrous sulfate 325 (65 FE) MG tablet Take  1 tablet (325 mg total) by mouth daily. 90 tablet 0  . LORazepam (ATIVAN) 0.5 MG tablet Take 0.5 mg by mouth every 8 (eight) hours.    . nitrofurantoin, macrocrystal-monohydrate, (MACROBID) 100 MG capsule Take 1 capsule (100 mg total) by mouth at bedtime. 60 capsule 0  . Prenatal Vit-Fe Fumarate-FA (PRENATAL MULTIVITAMIN) TABS tablet Take 1 tablet by mouth daily at 12 noon.    . Promethazine HCl (PHENERGAN PO) Take 1 tablet by mouth daily.    . vitamin C (ASCORBIC ACID) 500 MG tablet Take 1 tablet (500 mg total) by mouth daily. with iron supplement 90 tablet 0     Review of Systems  Constitutional: Negative.   Gastrointestinal: Negative.   Genitourinary: Negative.      Physical Exam   Vitals:   10/27/17 1831  BP: 122/76  Pulse: 82  Resp: 16  Temp: 98.3 F (36.8 C)  TempSrc: Oral  SpO2: 100%  Weight: 177 lb 8 oz (80.5 kg)    Physical Exam  Nursing note and vitals reviewed. Constitutional: She is oriented to person, place, and time. She appears well-developed and well-nourished. No distress.  HENT:  Head: Normocephalic and  atraumatic.  Eyes: Conjunctivae are normal. Right eye exhibits no discharge. Left eye exhibits no discharge. No scleral icterus.  Neck: Normal range of motion.  Respiratory: Effort normal. No respiratory distress.  GI: Soft. There is no tenderness.  Neurological: She is alert and oriented to person, place, and time.  Skin: Skin is warm and dry. She is not diaphoretic.  Psychiatric: She has a normal mood and affect. Her behavior is normal. Judgment and thought content normal.    MAU Course  Procedures No results found for this or any previous visit (from the past 24 hour(s)).  MDM NST:  Baseline: 140 bpm, Variability: Good {> 6 bpm), Accelerations: Reactive, Decelerations: Absent and no contractions Reactive NST Pt reports feeling the baby move once "she thinks" since arriving to MAU. Will get BPP.   Pt in ultrasound. Care [facility] Lab[facility]isha Wuellner CNM     Judeth HornLawrence, Erin, NP 10/27/2017 8:05 PM   US results on 10/27/17: showed BPP 4/8- off for movement and tone Total BPP 6/10 due to reactive NST   C/w Dr Emelda FearFerguson with results of BPP and NST- recommends admission to antenatal for observation with repeat BPP tomorrow.   Discussed POC with patient and recommendation to stay for observation. Patient agrees to POC. Orders placed for admission.  Assessment and Plan   1. [redacted] weeks gestation of pregnancy   2. Decreased fetal movement affecting management of pregnancy in third trimester, single or unspecified fetus   3. Decreased fetal movement   4.      4/8 BPP  Admit to antenatal for observation Repeat BPP tomorrow  Orders placed  Care taken over by Dr Emelda FearFerguson   Sharyon CableVeronica C Venus Gilles, CNM 10/27/17, 9:08 PM

## 2017-10-27 NOTE — MAU Note (Signed)
Has not felt the baby move today at all. No abd pain.  abd just feels so tight today.  No bleeding or leaking.

## 2017-10-27 NOTE — H&P (Signed)
Tiffany Whitehead is a 27 y.o. female presenting for overnight observation and repeat BPP in a.m. Due to presenting to MAU with complaints of essentially absent fetal movement  24 hours. She had NST that is beautifully reactive but BPP is 4/8 making complete BPP of 6/10, so will observe overnight and repeat BPP in a.m. Pt is quite nervous regarding the pregnancy, had decreased movement earlier this pregnancy about 4 weeks ago, had reassuring BPP here, and has been aware of fetal movement up until today. [facility], lives near the Hospital here, and presents here tonight with her Doula with whom she has a good supportive relationship.  OB History    Gravida  2   Para      Term      Preterm      AB  1   Living        SAB  1   TAB      Ectopic      Multiple      Live Births             Past Medical History:  Diagnosis Date  . Anxiety   . Depression   . H/O borderline personality disorder    stopped meds at beginning of pregnancy, Lamictal and Vyvanse  . Kidney stone   . UTI (urinary tract infection)    Past Surgical History:  Procedure Laterality Date  . COLONOSCOPY    . extraction of wisdom teeth     Family History: family history includes Cancer in her mother and other. Social History:  reports that she has never smoked. She has never used smokeless tobacco. She reports that she drank alcohol. She reports that she does not use drugs.     Maternal Diabetes:  Genetic Screening:  Maternal Ultrasounds/Referrals: Normal at 27wk here Fetal Ultrasounds or other Referrals:   Maternal Substance Abuse:  No Significant Maternal Medications:  None Significant Maternal Lab Results:   Other Comments:  bpp tonight 4/8=> 6/10 with reactive NST  ROS History  treated at 26 w6 for uti and ?pyelo with WBC on admit 24k then 2 wk of Septra as outpt. Blood pressure 122/76, pulse 82, temperature 98.3 F (36.8 C), temperature source Oral, resp. rate 16,  weight 177 lb 8 oz (80.5 kg), last menstrual period 02/13/2017, SpO2 100 %. Exam Physical Exam  Constitutional: She appears well-developed and well-nourished.  HENT:  Head: Normocephalic.  Genitourinary:  Genitourinary Comments: Gravid uterus , fetus in vertex presentation by u/s . Nontender uterus consistent with dates  Musculoskeletal: Normal range of motion.  Psychiatric: She has a normal mood and affect. Her behavior is normal.  anxious    Prenatal labs: ABO, Rh: --/--/A POS (04/04 1543) Antibody: NEG (04/04 1543) Rubella:   RPR:    HBsAg:    HIV:    GBS:     Assessment/Plan: Pregnancy 36 w4 d   Decreased fetal movement with BPP 6/10. Plan: Overnight observation, continuous EFM         Repeat BPP in a.m            Tilda BurrowJohn V Breton Berns 10/27/2017, 9:57 PM

## 2017-10-28 ENCOUNTER — Observation Stay (HOSPITAL_COMMUNITY): Payer: Medicaid Other

## 2017-10-28 LAB — CBC WITH DIFFERENTIAL/PLATELET
BASOS ABS: 0 10*3/uL (ref 0.0–0.1)
Basophils Relative: 0 %
EOS PCT: 1 %
Eosinophils Absolute: 0.1 10*3/uL (ref 0.0–0.7)
HCT: 28.9 % — ABNORMAL LOW (ref 36.0–46.0)
Hemoglobin: 9.5 g/dL — ABNORMAL LOW (ref 12.0–15.0)
LYMPHS ABS: 2.9 10*3/uL (ref 0.7–4.0)
Lymphocytes Relative: 22 %
MCH: 30.4 pg (ref 26.0–34.0)
MCHC: 32.9 g/dL (ref 30.0–36.0)
MCV: 92.6 fL (ref 78.0–100.0)
Monocytes Absolute: 0.6 10*3/uL (ref 0.1–1.0)
Monocytes Relative: 5 %
Neutro Abs: 9.3 10*3/uL — ABNORMAL HIGH (ref 1.7–7.7)
Neutrophils Relative %: 72 %
Platelets: 223 10*3/uL (ref 150–400)
RBC: 3.12 MIL/uL — AB (ref 3.87–5.11)
RDW: 14.7 % (ref 11.5–15.5)
WBC: 12.9 10*3/uL — AB (ref 4.0–10.5)

## 2017-10-28 LAB — ABO/RH: ABO/RH(D): A POS

## 2017-10-28 IMAGING — US US MFM FETAL BPP W/O NON-STRESS
1 series · 16 of 16 positions shown · non-contrast
Comparison: none

[Series 1: us mfm fetal bpp w/o non-stress · 16 acquisitions, 16 frames shown]
[im 1/16]
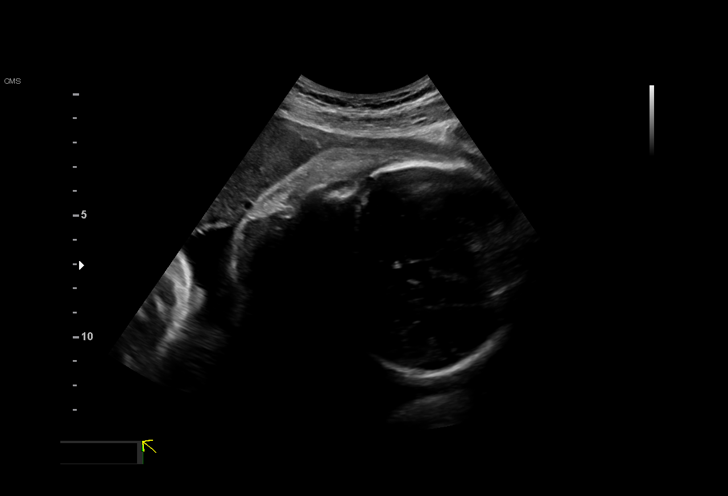
[im 2/16]
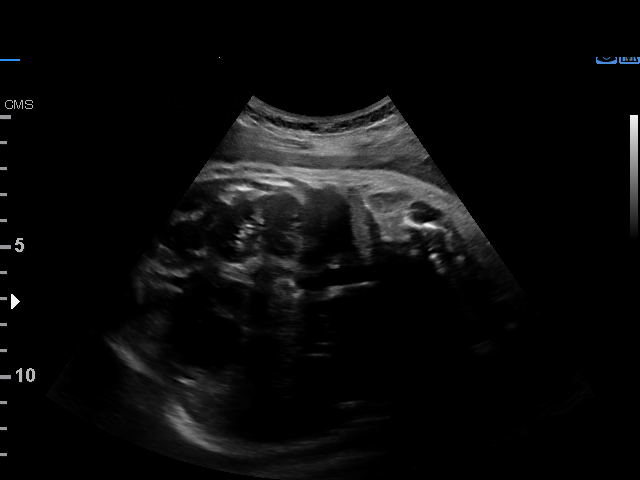
[im 3/16]
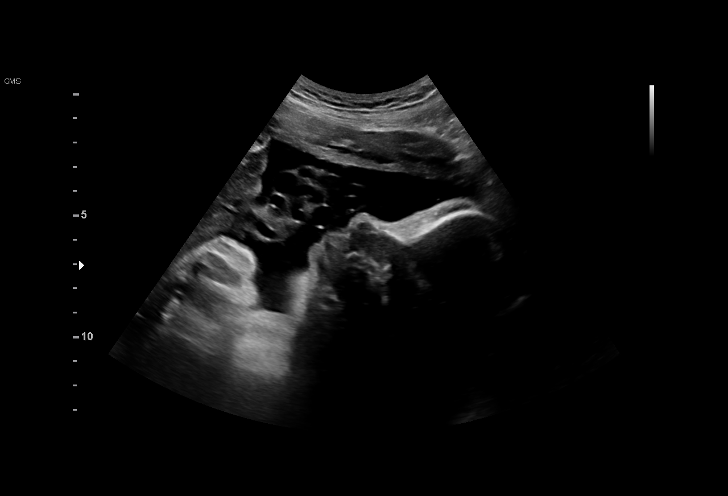
[im 4/16]
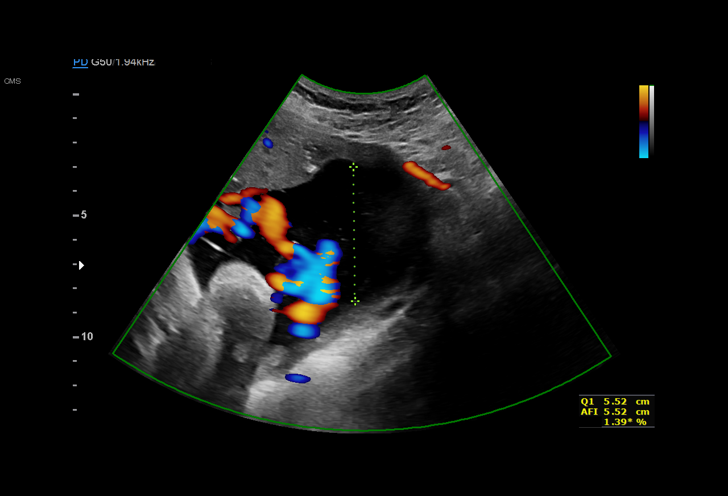
[im 5/16]
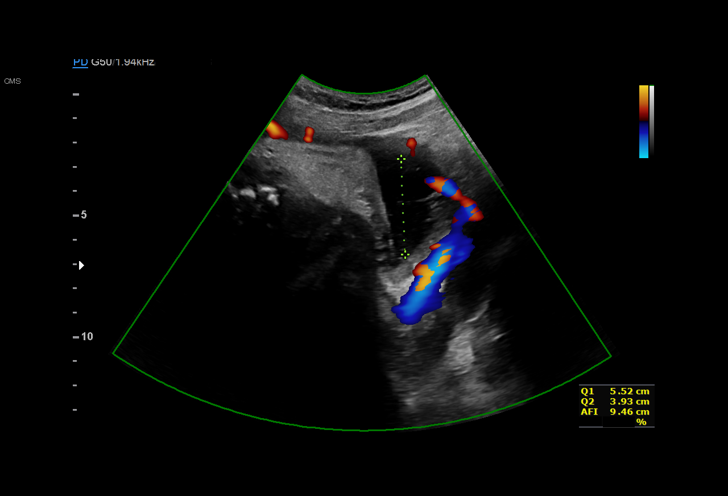
[im 6/16]
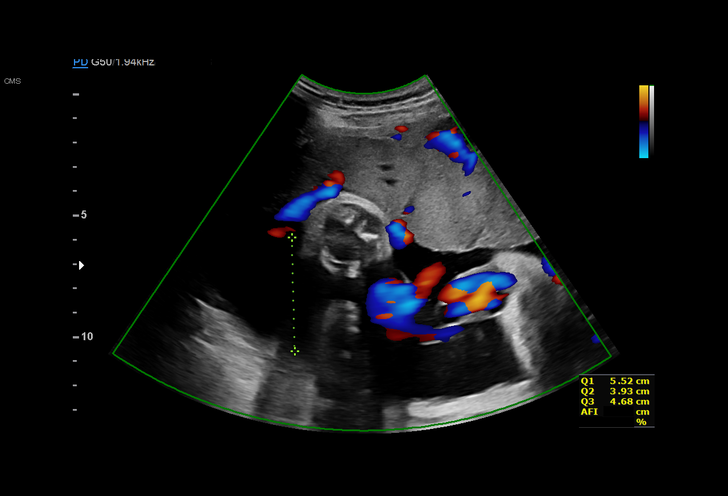
[im 7/16]
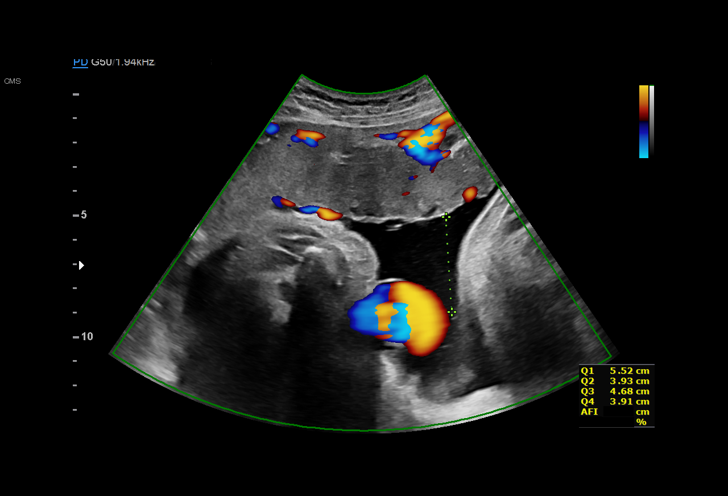
[im 8/16]
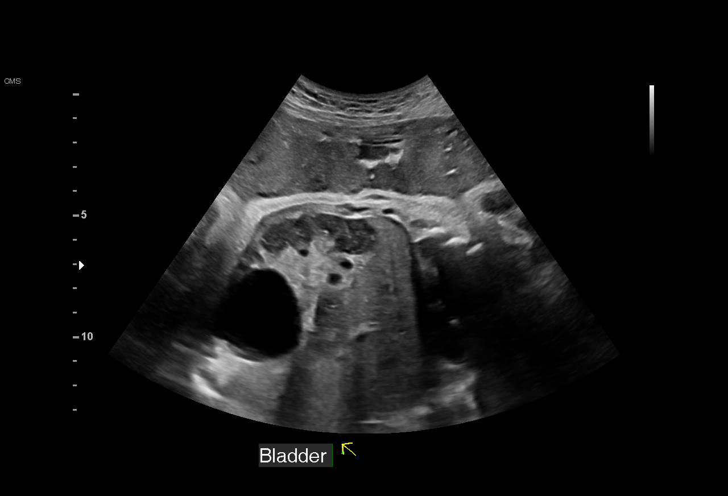
[im 9/16]
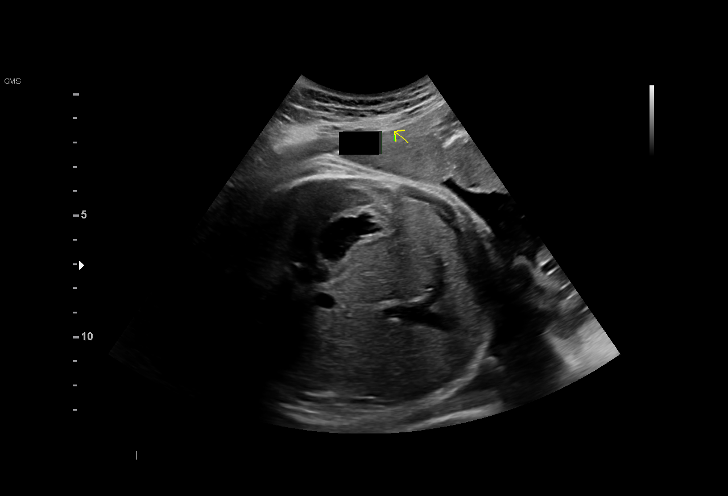
[im 10/16]
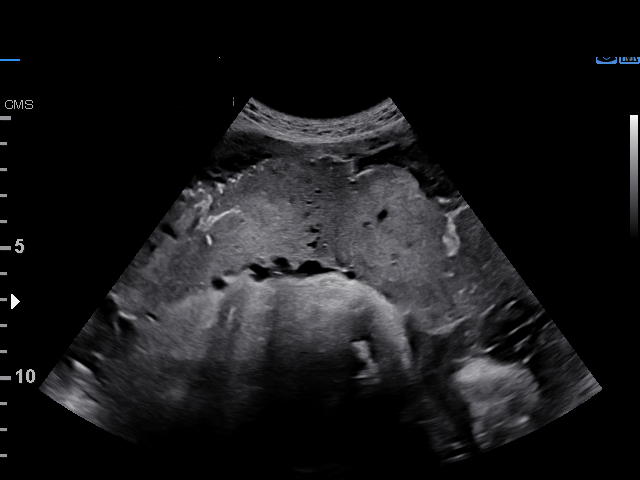
[im 11/16]
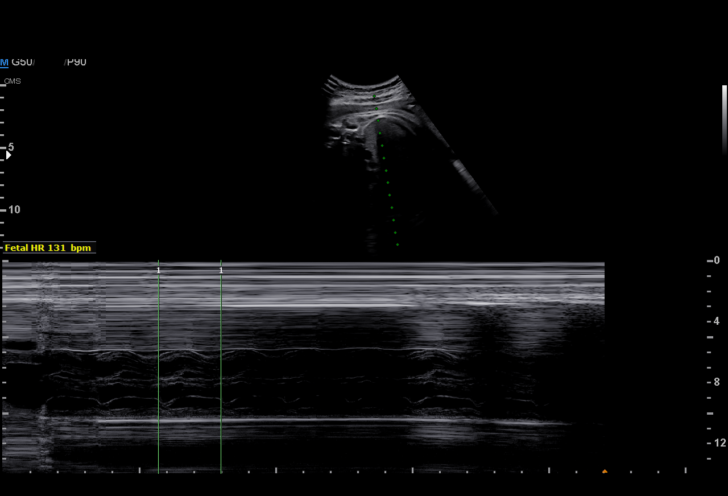
[im 12/16]
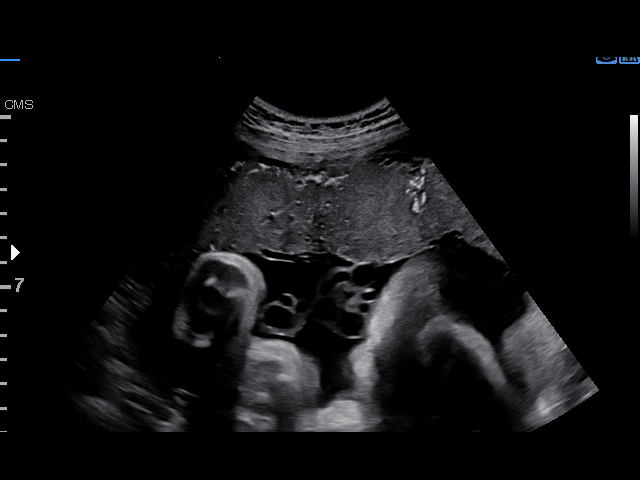
[im 13/16]
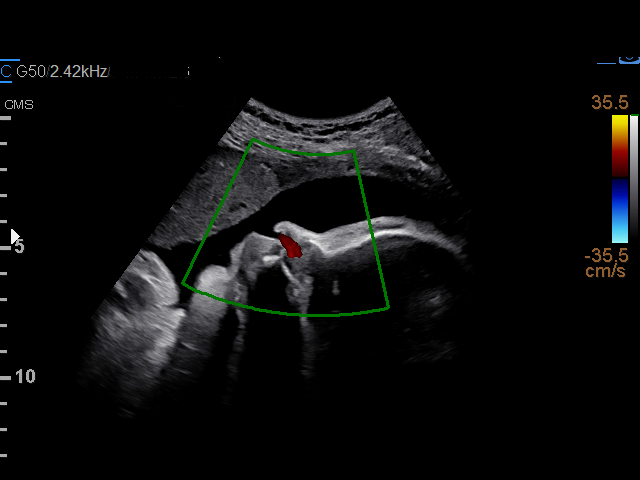
[im 14/16]
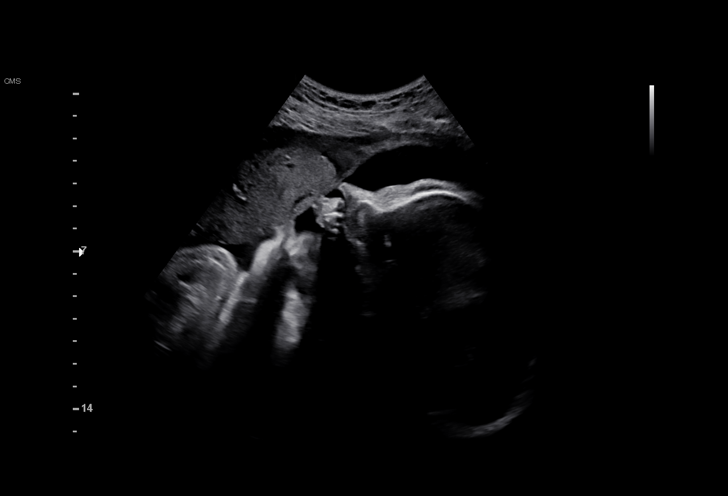
[im 15/16]
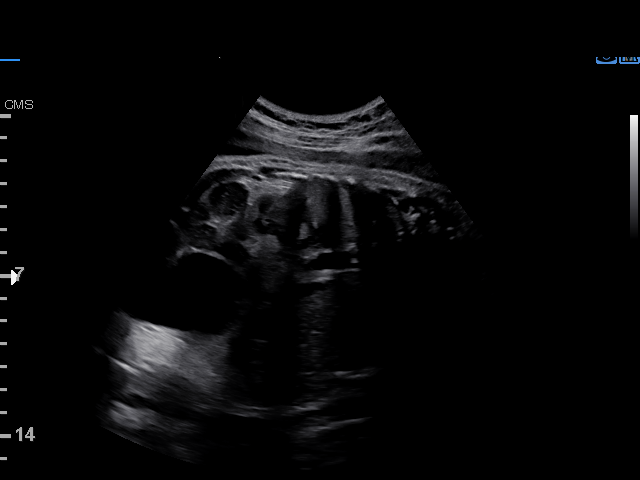
[im 16/16]
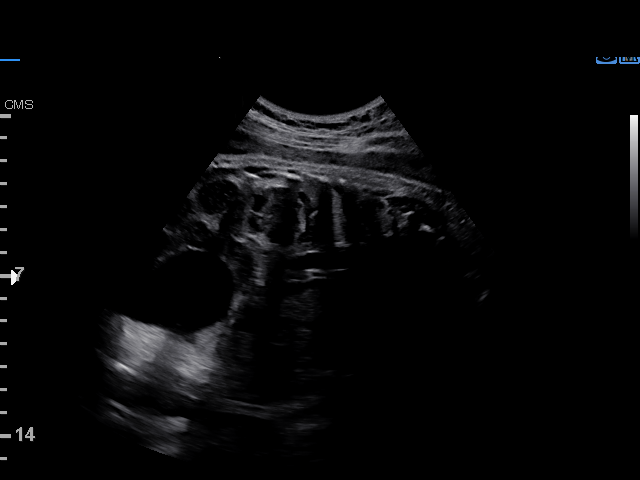

[16 of 16 positions shown; findings below may reference images not displayed]

1  AVIS          [PHONE_NUMBER]      [PHONE_NUMBER]     [PHONE_NUMBER]
Indications

36 weeks gestation of pregnancy
Decreased fetal movements, third trimester,    [K9]
unspecified
Failed BPP ([DATE]) yesterday
OB History

Blood Type:            Height:  5'8"   Weight (lb):  156       BMI:
Gravidity:    2         Term:   0        Prem:   0        SAB:   1
TOP:          0       Ectopic:  0        Living: 0
Fetal Evaluation

Num Of Fetuses:     1
Fetal Heart         131
Rate(bpm):
Cardiac Activity:   Observed
Presentation:       Cephalic

Amniotic Fluid
AFI FV:      Subjectively within normal limits

AFI Sum(cm)     %Tile       Largest Pocket(cm)
18.04           69

RUQ(cm)       RLQ(cm)       LUQ(cm)        LLQ(cm)
5.52
Biophysical Evaluation

Amniotic F.V:   Within normal limits       F. Tone:        Observed
F. Movement:    Observed                   Score:          [DATE]
F. Breathing:   Observed
Gestational Age
LMP:           37w 6d        Date:  [DATE]                 EDD:   [DATE]
Clinical EDD:  36w 6d                                        EDD:   [DATE]
Best:          36w 6d     Det. By:  Clinical EDD             EDD:   [DATE]
Impression

SIUP at 36+6 weeks
Cephalic presentation
Normal amniotic fluid volume
BPP [DATE]
Recommendations

Follow-up as clinically indicated

## 2017-10-28 MED ORDER — PROMETHAZINE HCL 25 MG PO TABS
25.0000 mg | ORAL_TABLET | Freq: Four times a day (QID) | ORAL | Status: DC | PRN
  Administered 2017-10-28: 25 mg via ORAL
  Filled 2017-10-28: qty 1

## 2017-10-28 MED ORDER — LORAZEPAM 1 MG PO TABS
0.5000 mg | ORAL_TABLET | Freq: Once | ORAL | Status: AC
  Administered 2017-10-28: 0.5 mg via ORAL
  Filled 2017-10-28: qty 1

## 2017-10-28 NOTE — Progress Notes (Signed)
Pt discharged with printed instructions. Pt verbalized an understanding. No concerns noted. Carmelia Tiner L Kooper Chriswell, RN 

## 2017-10-28 NOTE — Discharge Instructions (Signed)

## 2017-10-28 NOTE — Progress Notes (Signed)
Tiffany Whitehead is a 27 y.o. G2P0010 at 2652w5d  admitted for absent FM, but with reactive NST, who had bpp of 4/8 making total score 6/10. Kept overnight for early repeat BPP, 7:30 this am  Subjective: Pt had anxiety over night and responded to Ativan 0.5 mg x 1.  Pt is self described anxious person.   Objective: BP (!) 91/58 (BP Location: Left Arm)   Pulse 73   Temp 98 F (36.7 C) (Oral)   Resp 16   Ht 5\' 8"  (1.727 m)   Wt 177 lb (80.3 kg)   LMP 02/13/2017   SpO2 96%   BMI 26.91 kg/m  I/O last 3 completed shifts: In: 240 [P.O.:240] Out: 600 [Urine:600] No intake/output data recorded.  FHT:  FHR: 135 bpm, variability: moderate,  accelerations:  Present,  decelerations:  Absent UC:   none SVE:      Labs: Lab Results  Component Value Date   WBC 17.1 (H) 10/27/2017   HGB 11.0 (L) 10/27/2017   HCT 33.7 (L) 10/27/2017   MCV 92.1 10/27/2017   PLT 260 10/27/2017    Assessment / Plan: Bpp 6/10 for repeat bpp this am. if bpp 8/10 will d/c home and f/u as outpt.  Labor:  Preeclampsia:   Fetal Wellbeing:  Reactive NST, BPP in process Pain Control:   I/D:   Anticipated MOD:    Tiffany BurrowJohn V Glennis Whitehead 10/28/2017, 7:03 AM

## 2017-10-28 NOTE — Discharge Summary (Signed)
Patient ID: AJAYLA IGLESIAS MRN: [id number] DOB/AGE: 1990/06/29 27 y.o.  Admit date: 10/27/2017 Discharge date: 10/28/2017  Admission Diagnoses: IUP 36 5/7 weeks, Decreased Fetal Movement  Discharge Diagnoses: SAA  Prenatal Procedures: NST and BPP  Consults: None  Hospital Course:  This is a 27 y.o. G2P0010 with IUP at [redacted]w[redacted]d admitted for decreased fetal movement. BPP was 6/10. Observed overnight with repeat BBP this morning, 10/10.  Pt reports + FM as well. Felt pt was amendable for discharge home. Discharge follow up, instructions and medications reviewed with pt. Pt verbalized understanding.   Discharge Exam: Temp:  [97.9 F (36.6 C)-98.3 F (36.8 C)] 98.1 F (36.7 C) (06/13 0738) Pulse Rate:  [73-103] 75 (06/13 0738) Resp:  [16-18] 18 (06/13 0738) BP: (91-122)/(58-82) 104/72 (06/13 0738) SpO2:  [96 %-100 %] 99 % (06/13 0738) Weight:  [177 lb (80.3 kg)-177 lb 8 oz (80.5 kg)] 177 lb (80.3 kg) (06/12 2208)   Physical Examination: Lungs clear Heart RRR Abd soft + BS gravid non tender SVE deferred Ext non tender  Fetal monitoring: FHR: 140-150's bpm, Variability: moderate, Accelerations: Present, Decelerations: Absent  Uterine activity: none contractions per hour  Significant Diagnostic Studies:  Results for orders placed or performed during the hospital encounter of 10/27/17 (from the past 168 hour(s))  CBC on admission   Collection Time: 10/27/17  9:50 PM  Result Value Ref Range   WBC 17.1 (H) 4.0 - 10.5 K/uL   RBC 3.66 (L) 3.87 - 5.11 MIL/uL   Hemoglobin 11.0 (L) 12.0 - 15.0 g/dL   HCT 40.9 (L) 81.1 - 91.4 %   MCV 92.1 78.0 - 100.0 fL   MCH 30.1 26.0 - 34.0 pg   MCHC 32.6 30.0 - 36.0 g/dL   RDW 78.2 95.6 - 21.3 %   Platelets 260 150 - 400 K/uL  Type and screen [facility]   Collection Time: 10/27/17  9:50 PM  Result Value Ref Range   ABO/RH(D) A POS    Antibody Screen NEG    Sample Expiration      10/30/2017 Performed at [facility],  [address], [place], [place] [address]   ABO/Rh   Collection Time: 10/27/17  9:50 PM  Result Value Ref Range   ABO/RH(D)      A POS Performed at [facility], [address] [place], [place] [address]   CBC with Differential/Platelet   Collection Time: 10/28/17  6:04 AM  Result Value Ref Range   WBC 12.9 (H) 4.0 - 10.5 K/uL   RBC 3.12 (L) 3.87 - 5.11 MIL/uL   Hemoglobin 9.5 (L) 12.0 - 15.0 g/dL   HCT 29.5 (L) 28.4 - 13.2 %   MCV 92.6 78.0 - 100.0 fL   MCH 30.4 26.0 - 34.0 pg   MCHC 32.9 30.0 - 36.0 g/dL   RDW 44.0 10.2 - 72.5 %   Platelets 223 150 - 400 K/uL   Neutrophils Relative % 72 %   Neutro Abs 9.3 (H) 1.7 - 7.7 K/uL   Lymphocytes Relative 22 %   Lymphs Abs 2.9 0.7 - 4.0 K/uL   Monocytes Relative 5 %   Monocytes Absolute 0.6 0.1 - 1.0 K/uL   Eosinophils Relative 1 %   Eosinophils Absolute 0.1 0.0 - 0.7 K/uL   Basophils Relative 0 %   Basophils Absolute 0.0 0.0 - 0.1 K/uL    Discharge Condition: Stable  Disposition: Discharge disposition: 01-Home or Self Care        Discharge Instructions  Discharge activity:  No Restrictions   Complete by:  As directed    Discharge diet:  No restrictions   Complete by:  As directed    Fetal Kick Count:  Lie on our left side for one hour after a meal, and count the number of times your baby kicks.  If it is less than 5 times, get up, move around and drink some juice.  Repeat the test 30 minutes later.  If it is still less than 5 kicks in an hour, notify your doctor.   Complete by:  As directed    LABOR:  When conractions begin, you should start to time them from the beginning of one contraction to the beginning  of the next.  When contractions are 5 - 10 minutes apart or less and have been regular for at least an hour, you should call your health care provider.   Complete by:  As directed    No sexual activity restrictions   Complete by:  As directed    Notify physician for bleeding from the vagina   Complete by:   As directed    Notify physician for blurring of vision or spots before the eyes   Complete by:  As directed    Notify physician for chills or fever   Complete by:  As directed    Notify physician for fainting spells, "black outs" or loss of consciousness   Complete by:  As directed    Notify physician for increase in vaginal discharge   Complete by:  As directed    Notify physician for leaking of fluid   Complete by:  As directed    Notify physician for pain or burning when urinating   Complete by:  As directed    Notify physician for pelvic pressure (sudden increase)   Complete by:  As directed    Notify physician for severe or continued nausea or vomiting   Complete by:  As directed    Notify physician for sudden gushing of fluid from the vagina (with or without continued leaking)   Complete by:  As directed    Notify physician for sudden, constant, or occasional abdominal pain   Complete by:  As directed    Notify physician if baby moving less than usual   Complete by:  As directed      Allergies as of 10/28/2017      Reactions   Ceclor [cefaclor] Rash      Medication List    TAKE these medications   acetaminophen 325 MG tablet Commonly known as:  TYLENOL Take 2 tablets (650 mg total) by mouth every 4 (four) hours as needed for mild pain or moderate pain.   cyclobenzaprine 5 MG tablet Commonly known as:  FLEXERIL Take 1 tablet (5 mg total) by mouth 2 (two) times daily as needed (for muscle spasms or back pain).   ferrous sulfate 325 (65 FE) MG tablet Take 1 tablet (325 mg total) by mouth daily.   LORazepam 0.5 MG tablet Commonly known as:  ATIVAN Take 0.5 mg by mouth every 8 (eight) hours.   nitrofurantoin (macrocrystal-monohydrate) 100 MG capsule Commonly known as:  MACROBID Take 1 capsule (100 mg total) by mouth at bedtime.   PHENERGAN PO Take 1 tablet by mouth daily.   prenatal multivitamin Tabs tablet Take 1 tablet by mouth daily at 12 noon.   vitamin  C 500 MG tablet Commonly known as:  ASCORBIC ACID Take 1 tablet (500 mg total) by  mouth daily. with iron supplement      Follow-up Information    [facility]. Call.   Why:  Call office today and schedule follow up appt as per private MD Contact information: [address] [place] [address] [phone]          Signed: Hermina Staggers M.D. 10/28/2017, 9:50 AM

## 2017-11-21 ENCOUNTER — Inpatient Hospital Stay
Admission: RE | Admit: 2017-11-21 | Discharge: 2017-11-24 | DRG: 807 | Disposition: A | Discharge status: Home / Self Care | Payer: BLUE CROSS/BLUE SHIELD | Attending: Obstetrics & Gynecology | Admitting: Obstetrics & Gynecology

## 2017-11-21 DIAGNOSIS — F419 Anxiety disorder, unspecified: Secondary | ICD-10-CM | POA: Diagnosis present

## 2017-11-21 DIAGNOSIS — Z3A40 40 weeks gestation of pregnancy: Secondary | ICD-10-CM | POA: Diagnosis not present

## 2017-11-21 DIAGNOSIS — O43123 Velamentous insertion of umbilical cord, third trimester: Secondary | ICD-10-CM | POA: Diagnosis present

## 2017-11-21 DIAGNOSIS — O99824 Streptococcus B carrier state complicating childbirth: Secondary | ICD-10-CM | POA: Diagnosis present

## 2017-11-21 DIAGNOSIS — O99344 Other mental disorders complicating childbirth: Secondary | ICD-10-CM | POA: Diagnosis present

## 2017-11-21 DIAGNOSIS — Z3483 Encounter for supervision of other normal pregnancy, third trimester: Secondary | ICD-10-CM | POA: Diagnosis present

## 2017-11-21 DIAGNOSIS — B951 Streptococcus, group B, as the cause of diseases classified elsewhere: Secondary | ICD-10-CM | POA: Diagnosis present

## 2017-11-21 LAB — CBC
HEMATOCRIT: 30.9 % — AB (ref 35.0–47.0)
HEMOGLOBIN: 10.6 g/dL — AB (ref 12.0–16.0)
MCH: 30 pg (ref 26.0–34.0)
MCHC: 34.2 g/dL (ref 32.0–36.0)
MCV: 87.7 fL (ref 80.0–100.0)
Platelets: 293 10*3/uL (ref 150–440)
RBC: 3.53 MIL/uL — ABNORMAL LOW (ref 3.80–5.20)
RDW: 14.5 % (ref 11.5–14.5)
WBC: 14.6 10*3/uL — ABNORMAL HIGH (ref 3.6–11.0)

## 2017-11-21 MED ORDER — HYDROXYZINE HCL 25 MG PO TABS
50.0000 mg | ORAL_TABLET | Freq: Four times a day (QID) | ORAL | Status: DC | PRN
  Filled 2017-11-21: qty 1

## 2017-11-21 MED ORDER — SODIUM CHLORIDE 0.9 % IV SOLN
5.0000 10*6.[IU] | Freq: Once | INTRAVENOUS | Status: AC
  Administered 2017-11-21: 5 10*6.[IU] via INTRAVENOUS
  Filled 2017-11-21: qty 5

## 2017-11-21 MED ORDER — LACTATED RINGERS IV SOLN
INTRAVENOUS | Status: DC
  Administered 2017-11-21 – 2017-11-22 (×2): via INTRAVENOUS

## 2017-11-21 MED ORDER — TERBUTALINE SULFATE 1 MG/ML IJ SOLN: 0.2500 mg | Freq: Once | INTRAMUSCULAR | Status: DC | PRN

## 2017-11-21 MED ORDER — AMMONIA AROMATIC IN INHA
RESPIRATORY_TRACT | Status: AC
  Filled 2017-11-21: qty 10

## 2017-11-21 MED ORDER — PENICILLIN G POT IN DEXTROSE 60000 UNIT/ML IV SOLN
3.0000 10*6.[IU] | INTRAVENOUS | Status: DC
  Administered 2017-11-22: 3 10*6.[IU] via INTRAVENOUS
  Filled 2017-11-21: qty 50

## 2017-11-21 MED ORDER — OXYTOCIN BOLUS FROM INFUSION
500.0000 mL | Freq: Once | INTRAVENOUS | Status: AC
  Administered 2017-11-22: 500 mL via INTRAVENOUS

## 2017-11-21 MED ORDER — OXYTOCIN 40 UNITS IN LACTATED RINGERS INFUSION - SIMPLE MED
INTRAVENOUS | Status: AC
  Filled 2017-11-21: qty 1000

## 2017-11-21 MED ORDER — MISOPROSTOL 200 MCG PO TABS
ORAL_TABLET | ORAL | Status: AC
  Filled 2017-11-21: qty 4

## 2017-11-21 MED ORDER — BUTORPHANOL TARTRATE 2 MG/ML IJ SOLN
1.0000 mg | INTRAMUSCULAR | Status: DC | PRN
  Administered 2017-11-22: 1 mg via INTRAVENOUS
  Filled 2017-11-21: qty 1

## 2017-11-21 MED ORDER — OXYTOCIN 10 UNIT/ML IJ SOLN
INTRAMUSCULAR | Status: AC
  Filled 2017-11-21: qty 2

## 2017-11-21 MED ORDER — SOD CITRATE-CITRIC ACID 500-334 MG/5ML PO SOLN
30.0000 mL | ORAL | Status: DC | PRN
  Filled 2017-11-21: qty 15

## 2017-11-21 MED ORDER — OXYTOCIN 40 UNITS IN LACTATED RINGERS INFUSION - SIMPLE MED: 2.5000 [IU]/h | INTRAVENOUS | Status: DC

## 2017-11-21 MED ORDER — OXYTOCIN 40 UNITS IN LACTATED RINGERS INFUSION - SIMPLE MED: 1.0000 m[IU]/min | INTRAVENOUS | Status: DC

## 2017-11-21 MED ORDER — LIDOCAINE HCL (PF) 1 % IJ SOLN: 30.0000 mL | INTRAMUSCULAR | Status: DC | PRN

## 2017-11-21 MED ORDER — ACETAMINOPHEN 500 MG PO TABS
1000.0000 mg | ORAL_TABLET | Freq: Four times a day (QID) | ORAL | Status: DC | PRN
  Administered 2017-11-22: 1000 mg via ORAL
  Filled 2017-11-21 (×2): qty 2

## 2017-11-21 MED ORDER — LACTATED RINGERS IV SOLN: 500.0000 mL | INTRAVENOUS | Status: DC | PRN

## 2017-11-21 MED ORDER — LIDOCAINE HCL (PF) 1 % IJ SOLN
INTRAMUSCULAR | Status: AC
  Filled 2017-11-21: qty 30

## 2017-11-21 MED ORDER — ONDANSETRON HCL 4 MG/2ML IJ SOLN: 4.0000 mg | Freq: Four times a day (QID) | INTRAMUSCULAR | Status: DC | PRN

## 2017-11-21 NOTE — H&P (Addendum)
OB History & Physical   History of Present Illness:  Chief Complaint: contractions  HPI:  Tiffany Whitehead is a 27 y.o. G2P0010 female at [redacted]w[redacted]d dated by Patient's last menstrual period was 02/13/2017. Estimated Date of Delivery: 11/20/17   She presents to L&D with persistent and worsening contractions.   +FM, + CTX, no LOF, no VB  Pregnancy Issues: 1. GBS+ 2. Pyelonephritis @ 26wks 3. Anxiety d/o 4. Rubella non-immune  Maternal Medical History:   Past Medical History:  Diagnosis Date  . Anxiety   . Depression   . H/O borderline personality disorder    stopped meds at beginning of pregnancy, Lamictal and Vyvanse  . Kidney stone   . UTI (urinary tract infection)     Past Surgical History:  Procedure Laterality Date  . COLONOSCOPY    . extraction of wisdom teeth      Allergies  Allergen Reactions  . Ceclor [Cefaclor] Rash    Prior to Admission medications   Medication Sig Start Date End Date Taking? Authorizing Provider  Prenatal Vit-Fe Fumarate-FA (PRENATAL MULTIVITAMIN) TABS tablet Take 1 tablet by mouth daily at 12 noon.   Yes [provider]  acetaminophen (TYLENOL) 325 MG tablet Take 2 tablets (650 mg total) by mouth every 4 (four) hours as needed for mild pain or moderate pain. Patient not taking: Reported on 11/21/2017 10/09/17   [name], CNM  cyclobenzaprine (FLEXERIL) 5 MG tablet Take 1 tablet (5 mg total) by mouth 2 (two) times daily as needed (for muscle spasms or back pain). Patient not taking: Reported on 11/21/2017 10/09/17   [name], CNM  ferrous sulfate 325 (65 FE) MG tablet Take 1 tablet (325 mg total) by mouth daily. Patient not taking: Reported on 11/21/2017 10/09/17 10/09/18  [name], CNM  LORazepam (ATIVAN) 0.5 MG tablet Take 0.5 mg by mouth every 8 (eight) hours.    [provider]  nitrofurantoin, macrocrystal-monohydrate, (MACROBID) 100 MG capsule Take 1 capsule (100 mg total) by mouth at bedtime. Patient  not taking: Reported on 11/21/2017 10/09/17   [name], CNM  Promethazine HCl (PHENERGAN PO) Take 1 tablet by mouth daily.    [provider]  vitamin C (ASCORBIC ACID) 500 MG tablet Take 1 tablet (500 mg total) by mouth daily. with iron supplement Patient not taking: Reported on 11/21/2017 10/09/17   [name], CNM     Prenatal care site: [facility]   Social History: She  reports that she has never smoked. She has never used smokeless tobacco. She reports that she drank alcohol. She reports that she does not use drugs.  Family History: family history includes Cancer in her mother and other.   Review of Systems: A full review of systems was performed and negative except as noted in the HPI.     Physical Exam:  Vital Signs: LMP 02/13/2017  General: no acute distress.  HEENT: normocephalic, atraumatic Heart: regular rate & rhythm.  No murmurs/rubs/gallops Lungs: clear to auscultation bilaterally, normal respiratory effort Abdomen: soft, gravid, non-tender;  EFW: 7.0 Pelvic:   External: Normal external female genitalia  Cervix: 5/80/-1   Extremities: non-tender, symmetric, mild edema bilaterally.  DTRs: 2+  Neurologic: Alert & oriented x 3.    No results found for this or any previous visit (from the past 24 hour(s)).  Pertinent Results:  Prenatal Labs: Blood type/Rh A+  Antibody screen neg  Rubella Non-Immune  Varicella Immune  RPR NR  HBsAg Neg  HIV NR  GC  neg  Chlamydia neg  Genetic screening negative  1 hour GTT 123  3 hour GTT   GBS positive   FHT:  145 mod + accels no decels TOCO: q124min SVE:  5/80/-1, membranes intact   Cephalic by leopolds  Assessment:  Tiffany Whitehead is a 27 y.o. G2P0010 female at 2439w1d with labor.   Plan:  1. Admit to Labor & Delivery 2. CBC, T&S, Clrs, IVF 3. GBS  Positive - PCN  4. Consents obtained. 5. Continuous efm/toco 6. Epidural ok if platelets ok  ----- Ranae Plumberhelsea Ward, MD Attending  Obstetrician and Gynecologist [facility], Department of OB/GYN [facility]

## 2017-11-22 ENCOUNTER — Encounter: Payer: Self-pay | Admitting: Anesthesiology

## 2017-11-22 ENCOUNTER — Inpatient Hospital Stay: Payer: BLUE CROSS/BLUE SHIELD | Admitting: Anesthesiology

## 2017-11-22 ENCOUNTER — Other Ambulatory Visit: Payer: Self-pay

## 2017-11-22 DIAGNOSIS — B951 Streptococcus, group B, as the cause of diseases classified elsewhere: Secondary | ICD-10-CM | POA: Diagnosis present

## 2017-11-22 DIAGNOSIS — F419 Anxiety disorder, unspecified: Secondary | ICD-10-CM | POA: Diagnosis present

## 2017-11-22 LAB — TYPE AND SCREEN
ABO/RH(D): A POS
ANTIBODY SCREEN: NEGATIVE

## 2017-11-22 MED ORDER — LIDOCAINE-EPINEPHRINE (PF) 1.5 %-1:200000 IJ SOLN
INTRAMUSCULAR | Status: DC | PRN
  Administered 2017-11-22: 3 mL via EPIDURAL

## 2017-11-22 MED ORDER — EPHEDRINE 5 MG/ML INJ: 10.0000 mg | INTRAVENOUS | Status: DC | PRN

## 2017-11-22 MED ORDER — DOCUSATE SODIUM 100 MG PO CAPS
100.0000 mg | ORAL_CAPSULE | Freq: Two times a day (BID) | ORAL | Status: DC
  Administered 2017-11-22 – 2017-11-24 (×4): 100 mg via ORAL
  Filled 2017-11-22 (×4): qty 1

## 2017-11-22 MED ORDER — PHENYLEPHRINE 40 MCG/ML (10ML) SYRINGE FOR IV PUSH (FOR BLOOD PRESSURE SUPPORT): 80.0000 ug | PREFILLED_SYRINGE | INTRAVENOUS | Status: DC | PRN

## 2017-11-22 MED ORDER — FENTANYL 2.5 MCG/ML W/ROPIVACAINE 0.15% IN NS 100 ML EPIDURAL (ARMC)
EPIDURAL | Status: AC
  Filled 2017-11-22: qty 100

## 2017-11-22 MED ORDER — DIPHENHYDRAMINE HCL 25 MG PO CAPS: 25.0000 mg | ORAL_CAPSULE | Freq: Four times a day (QID) | ORAL | Status: DC | PRN

## 2017-11-22 MED ORDER — PRENATAL MULTIVITAMIN CH
1.0000 | ORAL_TABLET | Freq: Every day | ORAL | Status: DC
  Administered 2017-11-22 – 2017-11-24 (×3): 1 via ORAL
  Filled 2017-11-22 (×3): qty 1

## 2017-11-22 MED ORDER — SODIUM CHLORIDE 0.9 % IV SOLN
INTRAVENOUS | Status: DC | PRN
  Administered 2017-11-22 (×2): 5 mL via EPIDURAL

## 2017-11-22 MED ORDER — ONDANSETRON HCL 4 MG/2ML IJ SOLN: 4.0000 mg | INTRAMUSCULAR | Status: DC | PRN

## 2017-11-22 MED ORDER — ACETAMINOPHEN 500 MG PO TABS
1000.0000 mg | ORAL_TABLET | Freq: Four times a day (QID) | ORAL | Status: DC | PRN
  Administered 2017-11-22 – 2017-11-24 (×5): 1000 mg via ORAL
  Filled 2017-11-22 (×4): qty 2

## 2017-11-22 MED ORDER — DIPHENHYDRAMINE HCL 50 MG/ML IJ SOLN: 12.5000 mg | INTRAMUSCULAR | Status: DC | PRN

## 2017-11-22 MED ORDER — IBUPROFEN 600 MG PO TABS
600.0000 mg | ORAL_TABLET | Freq: Four times a day (QID) | ORAL | Status: DC
  Administered 2017-11-22 – 2017-11-24 (×9): 600 mg via ORAL
  Filled 2017-11-22 (×9): qty 1

## 2017-11-22 MED ORDER — LACTATED RINGERS IV SOLN
500.0000 mL | Freq: Once | INTRAVENOUS | Status: AC
  Administered 2017-11-22: 500 mL via INTRAVENOUS

## 2017-11-22 MED ORDER — WITCH HAZEL-GLYCERIN EX PADS
1.0000 "application " | MEDICATED_PAD | CUTANEOUS | Status: DC
  Administered 2017-11-22: 1 via TOPICAL
  Filled 2017-11-22: qty 100

## 2017-11-22 MED ORDER — SIMETHICONE 80 MG PO CHEW: 80.0000 mg | CHEWABLE_TABLET | ORAL | Status: DC | PRN

## 2017-11-22 MED ORDER — DIBUCAINE 1 % RE OINT: 1.0000 "application " | TOPICAL_OINTMENT | RECTAL | Status: DC | PRN

## 2017-11-22 MED ORDER — ONDANSETRON HCL 4 MG PO TABS: 4.0000 mg | ORAL_TABLET | ORAL | Status: DC | PRN

## 2017-11-22 MED ORDER — LIDOCAINE HCL (PF) 1 % IJ SOLN
INTRAMUSCULAR | Status: DC | PRN
  Administered 2017-11-22: 1 mL via INTRADERMAL

## 2017-11-22 MED ORDER — FENTANYL 2.5 MCG/ML W/ROPIVACAINE 0.15% IN NS 100 ML EPIDURAL (ARMC)
12.0000 mL/h | EPIDURAL | Status: DC
  Administered 2017-11-22: 12 mL/h via EPIDURAL

## 2017-11-22 MED ORDER — BENZOCAINE-MENTHOL 20-0.5 % EX AERO
1.0000 "application " | INHALATION_SPRAY | CUTANEOUS | Status: DC | PRN
  Administered 2017-11-22: 1 via TOPICAL
  Filled 2017-11-22: qty 56

## 2017-11-22 MED ORDER — COCONUT OIL OIL
1.0000 "application " | TOPICAL_OIL | Status: DC | PRN
  Filled 2017-11-22: qty 120

## 2017-11-22 NOTE — Anesthesia Preprocedure Evaluation (Signed)
Anesthesia Evaluation  Patient identified by MRN, date of birth, ID band Patient awake    Reviewed: Allergy & Precautions, H&P , NPO status , Patient's Chart, lab work & pertinent test results  History of Anesthesia Complications Negative for: history of anesthetic complications  Airway Mallampati: III  TM Distance: >3 FB Neck ROM: full    Dental  (+) Chipped   Pulmonary neg pulmonary ROS,           Cardiovascular Exercise Tolerance: Good (-) hypertensionnegative cardio ROS       Neuro/Psych PSYCHIATRIC DISORDERS Anxiety Depression    GI/Hepatic negative GI ROS,   Endo/Other    Renal/GU Renal disease  negative genitourinary   Musculoskeletal   Abdominal   Peds  Hematology negative hematology ROS (+)   Anesthesia Other Findings Past Medical History: No date: Anxiety No date: Depression No date: H/O borderline personality disorder     Comment:  stopped meds at beginning of pregnancy, Lamictal and               Vyvanse No date: Kidney stone No date: UTI (urinary tract infection)  Past Surgical History: No date: COLONOSCOPY No date: extraction of wisdom teeth  BMI    Body Mass Index:  25.85 kg/m      Reproductive/Obstetrics (+) Pregnancy                             Anesthesia Physical Anesthesia Plan  ASA: III  Anesthesia Plan: Epidural   Post-op Pain Management:    Induction:   PONV Risk Score and Plan:   Airway Management Planned:   Additional Equipment:   Intra-op Plan:   Post-operative Plan:   Informed Consent: I have reviewed the patients History and Physical, chart, labs and discussed the procedure including the risks, benefits and alternatives for the proposed anesthesia with the patient or authorized representative who has indicated his/her understanding and acceptance.     Plan Discussed with: Anesthesiologist  Anesthesia Plan Comments:          Anesthesia Quick Evaluation

## 2017-11-22 NOTE — Progress Notes (Addendum)
Intrapartum progress note:  S: very uncomfortable with contractions, using Nitrous Oxide, but not feeling much relief.  Listening to [facility].  O: BP 113/66 (BP Location: Left Arm)   Pulse 69   Temp 98.2 F (36.8 C) (Oral)   Resp 18   Ht 5\' 8"  (1.727 m)   Wt 77.1 kg (170 lb)   LMP 02/13/2017   SpO2 96%   BMI 25.85 kg/m   FHT: 130 mod + accels no decels TOCO: q2 min SVE: 5/80/-1, AROM'd for meconium-stained fluid  Results for orders placed or performed during the hospital encounter of 11/21/17 (from the past 24 hour(s))  CBC     Status: Abnormal   Collection Time: 11/21/17 11:19 PM  Result Value Ref Range   WBC 14.6 (H) 3.6 - 11.0 K/uL   RBC 3.53 (L) 3.80 - 5.20 MIL/uL   Hemoglobin 10.6 (L) 12.0 - 16.0 g/dL   HCT 84.630.9 (L) 96.235.0 - 95.247.0 %   MCV 87.7 80.0 - 100.0 fL   MCH 30.0 26.0 - 34.0 pg   MCHC 34.2 32.0 - 36.0 g/dL   RDW 84.114.5 32.411.5 - 40.114.5 %   Platelets 293 150 - 440 K/uL  Type and screen [facility]     Status: None   Collection Time: 11/21/17 11:19 PM  Result Value Ref Range   ABO/RH(D) A POS    Antibody Screen NEG    Sample Expiration      11/24/2017 Performed at [facility], [address] [place], [place] [id number]      A/P: [place]27yo [id number]@ 40+2 with labor  1. IUP: category 1 tracing 2. Labor: ctx picking up in intensity, patient requesting IV pain medication and prep for epidural.  Doula notified, and on her way. 3. GBS+ s/p penicillin, q4h 4. Anticipate vaginal delivery, continue expectant management after AROM.  ----- Ranae Plumberhelsea Koua Deeg, MD Attending Obstetrician and Gynecologist [facility], Department of OB/GYN [facility]

## 2017-11-22 NOTE — Anesthesia Procedure Notes (Signed)
Epidural Patient location during procedure: OB Start time: 11/22/2017 3:38 AM End time: 11/22/2017 3:43 AM  Staffing Anesthesiologist: Piscitello, Cleda MccreedyJoseph K, MD Performed: anesthesiologist   Preanesthetic Checklist Completed: patient identified, site marked, surgical consent, pre-op evaluation, timeout performed, IV checked, risks and benefits discussed and monitors and equipment checked  Epidural Patient position: sitting Prep: ChloraPrep Patient monitoring: heart rate, continuous pulse ox and blood pressure Approach: midline Location: L3-L4 Injection technique: LOR saline  Needle:  Needle type: Tuohy  Needle gauge: 17 G Needle length: 9 cm and 9 Needle insertion depth: 5.5 cm Catheter type: closed end flexible Catheter size: 19 Gauge Catheter at skin depth: 10.5 cm Test dose: negative and 1.5% lidocaine with Epi 1:200 K  Assessment Sensory level: T10 Events: blood not aspirated, injection not painful, no injection resistance, negative IV test and no paresthesia  Additional Notes 1 attempt Pt. Evaluated and documentation done after procedure finished. Patient identified. Risks/Benefits/Options discussed with patient including but not limited to bleeding, infection, nerve damage, paralysis, failed block, incomplete pain control, headache, blood pressure changes, nausea, vomiting, reactions to medication both or allergic, itching and postpartum back pain. Confirmed with bedside nurse the patient's most recent platelet count. Confirmed with patient that they are not currently taking any anticoagulation, have any bleeding history or any family history of bleeding disorders. Patient expressed understanding and wished to proceed. All questions were answered. Sterile technique was used throughout the entire procedure. Please see nursing notes for vital signs. Test dose was given through epidural catheter and negative prior to continuing to dose epidural or start infusion. Warning signs of  high block given to the patient including shortness of breath, tingling/numbness in hands, complete motor block, or any concerning symptoms with instructions to call for help. Patient was given instructions on fall risk and not to get out of bed. All questions and concerns addressed with instructions to call with any issues or inadequate analgesia.   Patient tolerated the insertion well without immediate complications.Reason for block:procedure for pain

## 2017-11-22 NOTE — Discharge Summary (Signed)
Obstetrical Discharge Summary  Patient Name: Tiffany Whitehead DOB: 01/26/1991 MRN: [id number]  Date of Admission: 11/21/2017 Date of Delivery: 11/22/17 Delivered by: Ranae Plumberhelsea Ward, MD Date of Discharge: 11/24/2017  [facility]  WUJ:[id number]:Patient's last menstrual period was 02/13/2017. EDC Estimated Date of Delivery: 11/20/17 Gestational Age at Delivery: 5161w2d   Antepartum complications:  1. GBS+ 2. Pyelonephritis @ 26wks 3. Anxiety d/o 4. Rubella non-immune  Admitting Diagnosis: Labor  Secondary Diagnosis: Patient Active Problem List   Diagnosis Date Noted  . Positive GBS test 11/22/2017  . Anxiety 11/22/2017  . Labor and delivery indication for care or intervention 11/21/2017    Augmentation: AROM Complications: None Intrapartum complications/course: Mom presented to L&D with labor, given antibiotics for GBS prophylaxis, AROM'd for meconium. Epidual placed. Doula present. Progressed to complete, second stage: <1960min, with delivery of fetal head with restitution to ROT.   Anterior then posterior shoulders delivered without difficulty.  Baby placed on mom's chest, and attended to by peds.  Cord was then clamped and cut by FOB after a >60sec delay.  Placenta spontaneously delivered, intact.   IV pitocin given for hemorrhage prophylaxis. No lacerations  Date of Delivery: 11/22/17 Delivered By: Leeroy Bockhelsea Ward Delivery Type: spontaneous vaginal delivery Anesthesia: epidural Placenta: sponatneous Laceration: none Episiotomy: none Newborn Data: Live born female "Tiffany Gianottiliza" Birth Weight:  4500g, 9lb14oz APGAR: 8, 9   Newborn Delivery   Birth date/time:  11/22/2017 06:02:00 Delivery type:  Vaginal, Spontaneous     Postpartum Procedures: None  Post partum course:  Patient had an uncomplicated postpartum course. By time of discharge on PPD#2, her pain was controlled on oral pain medications; she had appropriate lochia and was ambulating, voiding without difficulty and  tolerating regular diet.  She was deemed stable for discharge to home.     Discharge Physical Exam:  BP 108/66 (BP Location: Left Arm)   Pulse 60   Temp 97.6 F (36.4 C) (Oral)   Resp 18   Ht 5\' 8"  (1.727 m)   Wt 77.1 kg (170 lb)   LMP 02/13/2017   SpO2 100%   Breastfeeding? Unknown   BMI 25.85 kg/m   General: NAD CV: RRR Pulm: CTABL, nl effort ABD: s/nd/nt, fundus firm and below the umbilicus Lochia: moderate DVT Evaluation: LE non-ttp, no evidence of DVT on exam.  Hemoglobin  Date Value Ref Range Status  11/23/2017 9.7 (L) 12.0 - 16.0 g/dL Final   HGB  Date Value Ref Range Status  10/20/2011 12.1 12.0 - 16.0 g/dL Final   HCT  Date Value Ref Range Status  11/23/2017 29.0 (L) 35.0 - 47.0 % Final  10/20/2011 35.8 35.0 - 47.0 % Final     Disposition: stable, discharge to home. Baby Feeding: breastmilk Baby Disposition: home with mom  Rh Immune globulin given: n/a Rubella vaccine given: n/a Tdap vaccine given in AP or PP setting: AP Flu vaccine given in AP or PP setting: AP  Contraception: TBD  Prenatal Labs:   Blood type/Rh A+  Antibody screen neg  Rubella Non-Immune  Varicella Immune  RPR NR  HBsAg Neg  HIV NR  GC neg  Chlamydia neg  Genetic screening negative  1 hour GTT 123  3 hour GTT n/a  GBS positive    Plan:  Tiffany Whitehead was discharged to home in good condition. Follow-up appointment with Dr. Elesa MassedWard in 2 weeks.  Discharge Medications: Allergies as of 11/24/2017      Reactions   Ceclor [cefaclor] Rash  Medication List    STOP taking these medications   cyclobenzaprine 5 MG tablet Commonly known as:  FLEXERIL   nitrofurantoin (macrocrystal-monohydrate) 100 MG capsule Commonly known as:  MACROBID   PHENERGAN PO   vitamin C 500 MG tablet Commonly known as:  ASCORBIC ACID     TAKE these medications   acetaminophen 325 MG tablet Commonly known as:  TYLENOL Take 2 tablets (650 mg total) by mouth every 6 (six) hours as  needed for mild pain or moderate pain. What changed:  when to take this   ferrous sulfate 325 (65 FE) MG tablet Take 1 tablet (325 mg total) by mouth daily with breakfast. What changed:  when to take this   ibuprofen 600 MG tablet Commonly known as:  ADVIL,MOTRIN Take 1 tablet (600 mg total) by mouth every 6 (six) hours as needed for mild pain, moderate pain or cramping.   LORazepam 0.5 MG tablet Commonly known as:  ATIVAN Take 1 tablet (0.5 mg total) by mouth every 12 (twelve) hours as needed for anxiety. What changed:    when to take this  reasons to take this   prenatal multivitamin Tabs tablet Take 1 tablet by mouth daily at 12 noon.       Follow-up Information    Ward, Elenora Fender, MD Follow up in 6 week(s).   Specialty:  Obstetrics and Gynecology Why:  for postpartum check Contact information: [address] [place] [address] [phone]        [facility]. Schedule an appointment as soon as possible for a visit in 2 week(s).   Contact information: [address] [place] [address] [phone]          Signed: [name], CNM 11/24/2017 9:27 AM

## 2017-11-22 NOTE — Lactation Note (Signed)
This note was copied from a baby's chart. Lactation Consultation Note  Patient Name: Tiffany Whitehead Today's Date: 11/22/2017     Maternal Data  Mom given encouragement, reassured that baby is breastfeeding well Shown how to get deep latch before baby starts sucking Feeding  BAby eager to breastfeed   [facility]                   Interventions    Lactation Tools Discussed/Used     Consult Status      Tiffany Whitehead 11/22/2017, 8:49 PM

## 2017-11-23 LAB — CBC
HCT: 29 % — ABNORMAL LOW (ref 35.0–47.0)
HEMOGLOBIN: 9.7 g/dL — AB (ref 12.0–16.0)
MCH: 29.7 pg (ref 26.0–34.0)
MCHC: 33.5 g/dL (ref 32.0–36.0)
MCV: 88.6 fL (ref 80.0–100.0)
PLATELETS: 229 10*3/uL (ref 150–440)
RBC: 3.27 MIL/uL — AB (ref 3.80–5.20)
RDW: 14.6 % — ABNORMAL HIGH (ref 11.5–14.5)
WBC: 14.8 10*3/uL — AB (ref 3.6–11.0)

## 2017-11-23 LAB — RPR: RPR Ser Ql: NONREACTIVE

## 2017-11-23 MED ORDER — MEASLES, MUMPS & RUBELLA VAC ~~LOC~~ INJ
0.5000 mL | INJECTION | Freq: Once | SUBCUTANEOUS | Status: AC
  Administered 2017-11-24: 0.5 mL via SUBCUTANEOUS
  Filled 2017-11-23 (×2): qty 0.5

## 2017-11-23 MED ORDER — CLONAZEPAM 0.5 MG PO TBDP: 0.5000 mg | ORAL_TABLET | Freq: Two times a day (BID) | ORAL | Status: DC | PRN

## 2017-11-23 NOTE — Plan of Care (Signed)
Vs stable; up ad lib; taking tylenol and motrin for pain control; has history of anxiety and has been on meds prior to pregnancy; 2 times during this shift, pt got teary-eyed; RNs calmed her and discussed calming techniques; tolerating regular diet

## 2017-11-23 NOTE — Progress Notes (Signed)
Post Partum Day 1 Subjective: crying episodes this am . Doesnt necessarily feel depressed , but her anxiety level is high . Not on a antidepressant in the past   Objective: Blood pressure 110/70, pulse 83, temperature 97.8 F (36.6 C), temperature source Oral, resp. rate 18, height 5\' 8"  (1.727 m), weight 77.1 kg (170 lb), last menstrual period 02/13/2017, SpO2 97 %, unknown if currently breastfeeding.  Physical Exam:  General: alert and cooperative   [place] CTA   Cv RRR Lochia: appropriate Uterine Fundus: firm Incision: n/a DVT Evaluation: No evidence of DVT seen on physical exam.  Recent Labs    11/21/17 2319 11/23/17 0635  HGB 10.6* 9.7*  HCT 30.9* 29.0*    Assessment/Plan: Plan for discharge tomorrow Anxiety , lack of sleep and difficulty breast feeding . I have added Klonopin 0.5 mg po prn anxiety ,. Lactation consultant will see again today . If she is still tearful in the am she may benefit being placed on Zoloft Baby doing well . Husband in room and is supportive   LOS: 2 days   Ihor Austinhomas J Shrihaan Porzio 11/23/2017, 9:01 AM

## 2017-11-23 NOTE — Anesthesia Postprocedure Evaluation (Signed)
Anesthesia Post Note  Patient: Tiffany Whitehead  Procedure(s) Performed: AN AD HOC LABOR EPIDURAL  Patient location during evaluation: Mother Baby Anesthesia Type: Epidural Level of consciousness: awake and alert Pain management: pain level controlled Vital Signs Assessment: post-procedure vital signs reviewed and stable Respiratory status: spontaneous breathing, nonlabored ventilation and respiratory function stable Cardiovascular status: stable Postop Assessment: no headache, no backache and epidural receding Anesthetic complications: no     Last Vitals:  Vitals:   11/22/17 1939 11/22/17 2251  BP: 113/77 110/70  Pulse: 87 83  Resp: 18 18  Temp: 36.6 C 36.6 C  SpO2: 99% 97%    Last Pain:  Vitals:   11/23/17 0630  PainSc: 0-No pain                 Tiffany Whitehead,  Dajha Urquilla A

## 2017-11-24 MED ORDER — ACETAMINOPHEN 325 MG PO TABS: 650.0000 mg | ORAL_TABLET | Freq: Four times a day (QID) | ORAL | 0 refills | Status: DC | PRN

## 2017-11-24 MED ORDER — LORAZEPAM 0.5 MG PO TABS: 0.5000 mg | ORAL_TABLET | Freq: Two times a day (BID) | ORAL | 0 refills | Status: DC | PRN

## 2017-11-24 MED ORDER — FERROUS SULFATE 325 (65 FE) MG PO TABS: 325.0000 mg | ORAL_TABLET | Freq: Every day | ORAL | 0 refills | Status: DC

## 2017-11-24 MED ORDER — IBUPROFEN 600 MG PO TABS: 600.0000 mg | ORAL_TABLET | Freq: Four times a day (QID) | ORAL | 0 refills | Status: DC | PRN

## 2017-11-24 NOTE — Discharge Instructions (Signed)
After Your Delivery Discharge Instructions °  °Postpartum: Care Instructions ° °After childbirth (postpartum period), your body goes through many changes. Some of these changes happen over several weeks. In the hours after delivery, your body will begin to recover from childbirth while it prepares to breastfeed your newborn. You may feel emotional during this time. Your hormones can shift your mood without warning for no clear reason. ° °In the first couple of weeks after childbirth, many women have emotions that change from happy to sad. You may find it hard to sleep. You may cry a lot. This is called the "baby blues." These overwhelming emotions often go away within a couple of days or weeks. But it's important to discuss your feelings with your doctor. ° °You should call your care provider if you have unrelieved feelings of: °· Inability to cope °· Sadness °· Anxiety °· Lack of interest in baby °· Insomnia °· Crying ° °It is easy to get too tired and overwhelmed during the first weeks after childbirth. Don't try to do too much. Get rest whenever you can, accept help from others, and eat well and drink plenty of fluids. ° °About 4 to 6 weeks after your baby's birth, you will have a follow-up visit with your care provider. This visit is your time to talk to your provider about anything you are concerned or curious about. ° °Follow-up care is a key part of your treatment and safety. Be sure to make and go to all appointments, and call your doctor if you are having problems. It's also a good idea to know your test results and keep a list of the medicines you take. ° °How can you care for yourself at home? °· Sleep or rest when your baby sleeps. °· Get help with household chores from family or friends, if you can. Do not try to do it all yourself. °· If you have hemorrhoids or swelling or pain around the opening of your vagina, try using cold and heat. You can put ice or a cold pack on the area for 10 to 20 minutes at  a time. Put a thin cloth between the ice and your skin. Also try sitting in a few inches of warm water (sitz bath) 3 times a day and after bowel movements. °· Take pain medicines exactly as directed. °· If the provider gave you a prescription medicine for pain, take it as prescribed. °· If you do not have a prescription and need something over the counter, you can take: °· Ibuprofen (Motrin, Advil) up to 600mg every 6 hours as needed for pain °· Acetaminophen (Tylenol) up to 650mg every 4 hours as needed for pain °· Some people find it helpful to alternate between these two medications.  °· No driving for 1-2 weeks or while taking pain medications.  °· Eat more fiber to avoid constipation. Include foods such as whole-grain breads and cereals, raw vegetables, raw and dried fruits, and beans. °· Drink plenty of fluids, enough so that your urine is light yellow or clear like water. If you have kidney, heart, or liver disease and have to limit fluids, talk with your doctor before you increase the amount of fluids you drink. °· Do not put anything in the vagina for 6 weeks. This means no sex, no tampons, no douching, and no enemas. °· If you have stitches, keep the area clean by pouring or spraying warm water over the area outside your vagina and anus after you use the toilet. °·   No strenuous activity or heavy lifting for 6 weeks   No tub baths; showers only  Continue prenatal vitamin and iron.  If breastfeeding:  Increase calories and fluids while breastfeeding.  You may have a slight fever when your milk comes in, but it should go away on its own. If it does not, and rises above 101.0 please call the doctor.  For breastfeeding concerns, the lactation consultant can be reached at [phone].  For concerns about your baby, please call your pediatrician.   Keep a list of questions to bring to your postpartum visit. Your questions might be about:  Changes in your breasts, such as lumps or  soreness.  When to expect your menstrual period to start again.  What form of birth control is best for you.  Weight you have put on during the pregnancy.  Exercise options.  What foods and drinks are best for you, especially if you are breastfeeding.  Problems you might be having with breastfeeding.  When you can have sex. Some women may want to talk about lubricants for the vagina.  Any feelings of sadness or restlessness that you are having.   When should you call for help?  Call 911 anytime you think you may need emergency care. For example, call if:  You have thoughts of harming yourself, your baby, or another person.  You passed out (lost consciousness).  Call the office at [phone] or seek immediate medical care if:  If you have heavy bleeding such that you are soaking 1 pad in an hour for 2 hours  You are dizzy or lightheaded, or you feel like you may faint.  You have a fever; a temperature of 101.0 F or greater  Chills  Difficulty urinating  Headache unrelieved by "pain meds"   Visual changes  Pain in the right side of your belly near your ribs  Breasts reddened, hard, hot to the touch or any other breast concerns  Nipple discharge which is foul-smelling or contains pus   Increased pain at the site of the vagina   New pain unrelieved with recommended over-the-counter dosages  Difficulty breathing with or without chest pain   New leg pain, swelling, or redness, especially if it is only on one leg  Any other concerns  Watch closely for changes in your health, and be sure to contact your provider if:  You have new or worse vaginal discharge.  You feel sad or depressed.  You are having problems with your breasts or breastfeeding.

## 2017-11-24 NOTE — Lactation Note (Signed)
This note was copied from a baby's chart. Lactation Consultation Note  Patient Name: Tiffany Rene PaciMorgan Whitehead ZOXWR'UToday's Date: 11/24/2017 Reason for consult: Follow-up assessment;Mother's request   Maternal Data    Feeding Feeding Type: Breast Fed Length of feed: 25 min  LATCH Score Latch: Grasps breast easily, tongue down, lips flanged, rhythmical sucking.  Audible Swallowing: A few with stimulation  Type of Nipple: Everted at rest and after stimulation  Comfort (Breast/Nipple): Soft / non-tender  Hold (Positioning): Assistance needed to correctly position infant at breast and maintain latch.  LATCH Score: 8  Interventions    Lactation Tools Discussed/Used     Consult Status Consult Status: Complete Date: 11/24/17 Follow-up type: In-patient  Mom was concerned that feedings aren't going well. Spoke to patient about watching her baby for feeding cues and to respond to them instead of watching a clock. However, encouraged parents to attempt feedings q3/4hrs if baby doesn't awake to feed. Mom has hired her own LC to see dyad in the inhome setting in 24hrs. Parents are also aware of resources for support groups in the area and know how to contact [facility] at [facility] for outpatient consults.   Burnadette PeterJaniya M Lajeana Strough 11/24/2017, 2:07 PM

## 2017-11-24 NOTE — Progress Notes (Signed)
Provided and reviewed discharge paperwork and prescriptions. Patient verbalized understanding, husband was present as well. Utilized teach back method. Pt to make own follow up appointment for check up in 2 weeks. Discharged to visitor entrance with infant in carseat via wheel chair by hospital volunteer.

## 2018-02-08 ENCOUNTER — Ambulatory Visit: Payer: Self-pay

## 2019-02-08 ENCOUNTER — Other Ambulatory Visit: Payer: Self-pay

## 2019-02-08 DIAGNOSIS — Z20822 Contact with and (suspected) exposure to covid-19: Secondary | ICD-10-CM

## 2019-02-09 LAB — NOVEL CORONAVIRUS, NAA: SARS-CoV-2, NAA: NOT DETECTED

## 2019-03-03 ENCOUNTER — Other Ambulatory Visit: Payer: Self-pay

## 2019-03-03 ENCOUNTER — Ambulatory Visit: Payer: Self-pay

## 2019-03-03 DIAGNOSIS — Z23 Encounter for immunization: Secondary | ICD-10-CM

## 2019-03-13 ENCOUNTER — Encounter: Payer: Self-pay | Admitting: Medical

## 2019-03-13 ENCOUNTER — Other Ambulatory Visit: Payer: Self-pay

## 2019-03-13 ENCOUNTER — Ambulatory Visit: Payer: Self-pay | Admitting: Medical

## 2019-03-13 VITALS — BP 113/82 | HR 66 | Temp 98.9°F | Resp 16 | Wt 154.6 lb

## 2019-03-13 DIAGNOSIS — M79641 Pain in right hand: Secondary | ICD-10-CM

## 2019-03-13 DIAGNOSIS — M25511 Pain in right shoulder: Secondary | ICD-10-CM

## 2019-03-13 NOTE — Patient Instructions (Signed)
Thumb Sprain ° °A thumb sprain is an injury to one of the bands of tissue (ligaments) that connect the bones in your thumb. The ligament may be stretched too much, or it may be torn. A tear can be either partial or complete. How bad, or severe, the sprain is depends on how much of the ligament was damaged or torn. °What are the causes? °A thumb sprain is often caused by a fall or an accident, such as when you hold your hands out to catch something or to protect yourself. °What increases the risk? °This injury is more likely to occur in people who play sports that involve: °· A risk of falling, such as skiing. °· Catching an object, such as basketball. °What are the signs or symptoms? °Symptoms of this condition include: °· Not being able to move the thumb normally. °· Swelling. °· Tenderness. °· Bruising. °How is this diagnosed? °This condition may be diagnosed based on: °· Your symptoms and medical history. Your health care provider may ask about any recent injuries to your thumb. °· A physical exam. °· Imaging studies such as X-ray, ultrasound, or MRI. °How is this treated? °Treatment for this condition depends on how severe your sprain is. °· If your ligament is overstretched or partially torn, treatment usually involves keeping your thumb in a fixed position (immobilization) for at least 4 to 6 weeks. Your health care provider will apply a bandage, cast, or splint to keep your thumb from moving until it heals. °· If your ligament is fully torn, you may need surgery to reconnect the ligament to the bone. After surgery, you will need to wear a cast or splint on your thumb. °Your health care provider may also recommend physical therapy to strengthen your thumb. °Follow these instructions at home: °If you have a splint or bandage: °· Wear the splint or bandage as told by your health care provider. Remove it only as told by your health care provider. °· Loosen the splint or bandage if your thumb or fingers tingle,  become numb, or turn cold and blue. °· Keep the splint or bandage clean and dry. °If you have a cast: °· Do not stick anything inside the cast to scratch your skin. Doing that increases your risk of infection. °· Check the skin around the cast every day. Tell your health care provider about any concerns. °· You may put lotion on dry skin around the edges of the cast. Do not put lotion on the skin underneath the cast. °· Keep the cast clean and dry. °Bathing °· Do not take baths, swim, or use a hot tub until your health care provider approves. Ask your health care provider if you may take showers. You may only be allowed to take sponge baths. °· If your splint, bandage, or cast is not waterproof: °? Do not let it get wet. °? Cover it with a watertight covering to protect it from water when you take a bath or shower. °Managing pain, stiffness, and swelling ° °· If directed, put ice on your thumb: °? If you have a removable splint, remove it as told by your health care provider. °? Put ice in a plastic bag. °? Place a towel between your skin and the bag, or between your cast and the bag. °? Leave the ice on for 20 minutes, 2-3 times a day. °· Move your fingers often to avoid stiffness and to lessen swelling. °· Raise (elevate) your hand above the level of your heart   while you are sitting or lying down. °Activity °· Return to your normal activities as told by your health care provider. Ask your health care provider what activities are safe for you. °· Do physical therapy exercises as directed. After your splint or cast is removed, your health care provider may recommend that you: °? Move your thumb in circles. °? Touch your thumb to your pinky finger. °? Do these exercises several times a day. °· Ask your health care provider if you may use a hand exerciser to strengthen your muscles. °· If your thumb feels stiff while you are exercising it, try doing the exercises while soaking your hand in warm water. °Driving °· Do  not drive until your health care provider approves. °· Donot drive or use heavy machinery while taking prescription pain medicine. °General instructions °· Do not put pressure on any part of the cast or splint until it is fully hardened, if applicable. This may take several hours. °· Take over-the-counter and prescription medicines only as told by your health care provider. °· Do not use any products that contain nicotine or tobacco, such as cigarettes and e-cigarettes. These can delay healing. If you need help quitting, ask your health care provider. °· Do not wear rings on your injured thumb. °· Keep all follow-up visits as told by your health care provider. This is important. °Contact a health care provider if you have: °· Pain that gets worse or does not get better with medicine. °· Bruising or swelling that gets worse. °· Your cast or splint is damaged. °Get help right away if: °· Your thumb feels numb, tingles, turns cold, or turns blue, even after loosening your splint or bandage (if applicable). °Summary °· A thumb sprain is an injury to one of the bands of tissue (ligaments) that connect the bones in your thumb. °· Thumb sprains are more likely to occur in people who play sports that involve a risk of falling or having to catch an object. °· Treatment will depend on how severe the sprain is, but it will require keeping the thumb in a fixed position. It might require surgery. °· Make sure you understand and follow all of your health care provider's instructions for home care. °This information is not intended to replace advice given to you by your health care provider. Make sure you discuss any questions you have with your health care provider. °Document Released: 06/11/2004 Document Revised: 05/27/2017 Document Reviewed: 05/27/2017 °Elsevier Patient Education © 2020 [employer] Inc. ° °

## 2019-03-13 NOTE — Progress Notes (Signed)
Subjective:    Patient ID: Tiffany Whitehead, female    DOB: [birth year], 28 y.o.   MRN: [id number]  HPI 28 yo female in non acute distress. Larey Seat forward yesterday at  7am noon on outstretched hand , had a leash wrapped around right thumb , dog pulled on finger (dog weighs 90lbs).  Patient is Right handed    She has been doing RICE taking Tylenol and Ibuprofen alternating these medications, took Tylenol 325mg  x 2 tablets this am..  Tingling in all finger tips including thumb,Painful to turn a door knob or grasp something with index and thumb.   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 Also has soreness in right shoulder.    Has an Epi pen allergic to Bees Reviewed medications  listed with patient she states she only is taking Ativan prn at this time.[employer] History of appendectomy Sept 2019  Blood pressure 113/82, pulse 66, temperature 98.9 F (37.2 C), temperature source Tympanic, resp. rate 16, weight 154 lb 9.6 oz (70.1 kg), SpO2 100 %, unknown if currently breastfeeding.  Review of Systems  Musculoskeletal: Positive for joint swelling and myalgias (right shoulder anterior). Negative for back pain, neck pain and neck stiffness.  Skin: Negative for color change, pallor, rash and wound.  Neurological: Negative for syncope.  Denies hitting head.      Objective:   Physical Exam Constitutional:      Appearance: Normal appearance. She is normal weight.  HENT:     Head: Normocephalic.  Eyes:     Extraocular Movements: Extraocular movements intact.     Conjunctiva/sclera: Conjunctivae normal.     Pupils: Pupils are equal, round, and reactive to light.  Neck:     Musculoskeletal: Normal range of motion.  Musculoskeletal:        General: Swelling, tenderness and signs of injury present. No deformity.     Right shoulder: She exhibits tenderness and pain. She exhibits normal range of motion, no bony tenderness, no swelling, no  effusion, no crepitus, no deformity and normal pulse.       Arms:     Right hand: She exhibits decreased range of motion, tenderness and swelling. She exhibits normal capillary refill, no deformity and no laceration. Decreased strength noted. She exhibits finger abduction (thumb to index) and thumb/finger opposition. She exhibits no wrist extension trouble.       Hands:  Skin:    General: Skin is warm and dry.     Capillary Refill: Capillary refill takes less than 2 seconds.  Neurological:     General: No focal deficit present.     Mental Status: She is alert and oriented to person, place, and time.  Psychiatric:        Mood and Affect: Mood normal.        Behavior: Behavior normal.        Thought Content: Thought content normal.        Judgment: Judgment normal.      Shoulder tenderl with palp of anterior shoulder, full range of motion. Painful to pinch fingers index and thumb, cannot  Completely close and painful to oppose thumb cannot  completely.swelling noted in the thenar along with bruising.(she states was much worse yesterday but she also had it wrapped).Removed for my exam. FROM of   Right wrist and elbow no bony tenderness or crepitus,      Assessment & Plan:  Right hand pain info given on  sprained thumb and  right shoulder pain anteriorly Referred to [facility] walk in hours and given map/address and phone number of clinic.X-ray recommended.most likely needs a thumb splint. Has had Flu Vaccine. RICE and Ibuprofen ,  Patient verbalizes understanding and has no questions at discharge.

## 2019-03-15 ENCOUNTER — Other Ambulatory Visit: Payer: Self-pay | Admitting: *Deleted

## 2019-03-15 DIAGNOSIS — Z20822 Contact with and (suspected) exposure to covid-19: Secondary | ICD-10-CM

## 2019-03-16 LAB — NOVEL CORONAVIRUS, NAA: SARS-CoV-2, NAA: NOT DETECTED

## 2019-05-19 NOTE — L&D Delivery Note (Signed)
Delivery Note  Date of delivery: 01/27/2020 Estimated Date of Delivery: 02/09/20 Patient's last menstrual period was 04/01/2019. EGA: [redacted]w[redacted]d  First Stage: Labor onset: 01/26/2020 0500 Augmentation : AROM Analgesia Eliezer Lofts intrapartum: epidural AROM at 0642 01/27/2020 for clear fluid   Ardelle Park presented to L&D with contractions. She was augmented with AROM. Epidural placed for pain relief.   Second Stage: Complete dilation at 0928 Onset of pushing at 0930 FHR second stage: category II  Delivery at 0958 on 01/27/2020  She progressed to complete and had a spontaneous vaginal birth of a live female, pushing over an intact perineum. The fetal head was delivered in direct OA position with restitution to LOA. No nuchal cord. Anterior then posterior shoulders delivered with minimal assistance. Baby placed on mom's abdomen and attended to by transition RN. Cord clamped and cut when pulseless by father of the baby, Micah.   Third Stage: Placenta delivered spontaneously intact with 3VC at 1001 Placenta disposition: routine disposal Uterine tone firm / bleeding scant IV pitocin given for hemorrhage prophylaxis  No laceration identified  Anesthesia for repair: n/a Repair n/a Quant. Blood Loss (mL): 320  Complications: none  Mom to postpartum.  Baby to Couplet care / Skin to Skin.  Newborn: Birth Weight: pending  Apgar Scores: 8, 9 Feeding planned: breast   [name], CNM 01/27/2020 10:19 AM

## 2019-07-10 DIAGNOSIS — Z3483 Encounter for supervision of other normal pregnancy, third trimester: Secondary | ICD-10-CM | POA: Insufficient documentation

## 2019-07-10 DIAGNOSIS — Z3490 Encounter for supervision of normal pregnancy, unspecified, unspecified trimester: Secondary | ICD-10-CM | POA: Insufficient documentation

## 2019-07-10 LAB — OB RESULTS CONSOLE GC/CHLAMYDIA
Chlamydia: NEGATIVE
Gonorrhea: NEGATIVE

## 2019-07-10 LAB — HEPATITIS C ANTIBODY: HCV Ab: NEGATIVE

## 2019-07-10 LAB — OB RESULTS CONSOLE RUBELLA ANTIBODY, IGM: Rubella: IMMUNE

## 2019-07-10 LAB — OB RESULTS CONSOLE HEPATITIS B SURFACE ANTIGEN: Hepatitis B Surface Ag: NEGATIVE

## 2019-07-10 LAB — OB RESULTS CONSOLE VARICELLA ZOSTER ANTIBODY, IGG: Varicella: IMMUNE

## 2019-07-10 LAB — OB RESULTS CONSOLE RPR
RPR: NONREACTIVE
RPR: NONREACTIVE

## 2019-08-05 ENCOUNTER — Ambulatory Visit: Payer: Medicaid Other | Attending: Internal Medicine

## 2019-08-05 DIAGNOSIS — Z23 Encounter for immunization: Secondary | ICD-10-CM

## 2019-08-05 NOTE — Progress Notes (Signed)
   Covid-19 Vaccination Clinic  Name:  Tiffany Whitehead    MRN: [id number] DOB: 12/06/1990  08/05/2019  Tiffany Whitehead was observed post Covid-19 immunization for 15 minutes without incident. She was provided with Vaccine Information Sheet and instruction to access the V-Safe system.   Tiffany Whitehead was instructed to call 911 with any severe reactions post vaccine: Marland Kitchen Difficulty breathing  . Swelling of face and throat  . A fast heartbeat  . A bad rash all over body  . Dizziness and weakness   Immunizations Administered    Name Date Dose VIS Date Route   Pfizer COVID-19 Vaccine 08/05/2019  9:57 AM 0.3 mL 04/28/2019 Intramuscular   Manufacturer: [employer], [employer]   Lot: [id number]   NDC: [address]-9

## 2019-08-29 ENCOUNTER — Ambulatory Visit: Payer: Medicaid Other | Attending: Internal Medicine

## 2019-08-29 DIAGNOSIS — Z23 Encounter for immunization: Secondary | ICD-10-CM

## 2019-08-29 NOTE — Progress Notes (Signed)
   Covid-19 Vaccination Clinic  Name:  Tiffany Whitehead    MRN: [id number] DOB: March 30, 1991  08/29/2019  Tiffany Whitehead was observed post Covid-19 immunization for 30 minutes based on pre-vaccination screening without incident. She was provided with Vaccine Information Sheet and instruction to access the V-Safe system.   Tiffany Whitehead was instructed to call 911 with any severe reactions post vaccine: Marland Kitchen Difficulty breathing  . Swelling of face and throat  . A fast heartbeat  . A bad rash all over body  . Dizziness and weakness   Immunizations Administered    Name Date Dose VIS Date Route   Pfizer COVID-19 Vaccine 08/29/2019  1:11 PM 0.3 mL 04/28/2019 Intramuscular   Manufacturer: [employer], [employer]   Lot: [id number]   NDC: [address]-1

## 2019-09-02 ENCOUNTER — Emergency Department
Admission: EM | Admit: 2019-09-02 | Discharge: 2019-09-02 | Disposition: A | Discharge status: Home / Self Care | Payer: BC Managed Care – PPO | Attending: Emergency Medicine | Admitting: Emergency Medicine

## 2019-09-02 ENCOUNTER — Other Ambulatory Visit: Payer: Self-pay

## 2019-09-02 ENCOUNTER — Emergency Department: Payer: BC Managed Care – PPO

## 2019-09-02 DIAGNOSIS — Z3A17 17 weeks gestation of pregnancy: Secondary | ICD-10-CM | POA: Insufficient documentation

## 2019-09-02 DIAGNOSIS — R102 Pelvic and perineal pain: Secondary | ICD-10-CM | POA: Insufficient documentation

## 2019-09-02 DIAGNOSIS — N939 Abnormal uterine and vaginal bleeding, unspecified: Secondary | ICD-10-CM

## 2019-09-02 DIAGNOSIS — O209 Hemorrhage in early pregnancy, unspecified: Secondary | ICD-10-CM | POA: Diagnosis present

## 2019-09-02 LAB — BASIC METABOLIC PANEL
Anion gap: 9 (ref 5–15)
BUN: 10 mg/dL (ref 6–20)
CO2: 23 mmol/L (ref 22–32)
Calcium: 9.2 mg/dL (ref 8.9–10.3)
Chloride: 104 mmol/L (ref 98–111)
Creatinine, Ser: 0.61 mg/dL (ref 0.44–1.00)
GFR calc Af Amer: >60 mL/min (ref >60)
GFR calc non Af Amer: >60 mL/min (ref >60)
Glucose, Bld: 103 mg/dL — ABNORMAL HIGH (ref 70–99)
Potassium: 3.5 mmol/L (ref 3.5–5.1)
Sodium: 136 mmol/L (ref 135–145)

## 2019-09-02 LAB — URINALYSIS, COMPLETE (UACMP) WITH MICROSCOPIC
Bilirubin Urine: NEGATIVE
Glucose, UA: NEGATIVE mg/dL
Hgb urine dipstick: NEGATIVE
Ketones, ur: NEGATIVE mg/dL
Nitrite: NEGATIVE
Protein, ur: NEGATIVE mg/dL
Specific Gravity, Urine: 1.024 (ref 1.005–1.030)
pH: 6 (ref 5.0–8.0)

## 2019-09-02 LAB — CBC
HCT: 34.6 % — ABNORMAL LOW (ref 36.0–46.0)
Hemoglobin: 12.1 g/dL (ref 12.0–15.0)
MCH: 31.3 pg (ref 26.0–34.0)
MCHC: 35 g/dL (ref 30.0–36.0)
MCV: 89.6 fL (ref 80.0–100.0)
Platelets: 215 10*3/uL (ref 150–400)
RBC: 3.86 MIL/uL — ABNORMAL LOW (ref 3.87–5.11)
RDW: 13 % (ref 11.5–15.5)
WBC: 9.3 10*3/uL (ref 4.0–10.5)
nRBC: 0 % (ref 0.0–0.2)

## 2019-09-02 LAB — HCG, QUANTITATIVE, PREGNANCY: hCG, Beta Chain, Quant, S: 53536 m[IU]/mL — ABNORMAL HIGH (ref <5)

## 2019-09-02 MED ORDER — CEPHALEXIN 500 MG PO CAPS: 500.0000 mg | ORAL_CAPSULE | Freq: Three times a day (TID) | ORAL | 0 refills | Status: AC

## 2019-09-02 MED ORDER — ACETAMINOPHEN 500 MG PO TABS
1000.0000 mg | ORAL_TABLET | Freq: Once | ORAL | Status: AC
  Administered 2019-09-02: 1000 mg via ORAL
  Filled 2019-09-02: qty 2

## 2019-09-02 MED ORDER — FENTANYL CITRATE (PF) 100 MCG/2ML IJ SOLN: 25.0000 ug | Freq: Once | INTRAMUSCULAR | Status: DC

## 2019-09-02 NOTE — ED Provider Notes (Signed)
Emergency Department Provider Note  ____________________________________________  Time seen: Approximately 7:22 PM  I have reviewed the triage vital signs and the nursing notes.   HISTORY  Chief Complaint Vaginal Bleeding   Historian Patient     HPI Tiffany Whitehead is a 29 y.o. female G2, P1, presents to the emergency department with concern for vaginal bleeding that started today as well as pelvic cramping.  Patient states that she noticed light pink blood.  No frank blood or blood clots.  Patient is approximately 17 weeks in pregnancy has been unremarkable up until this point.  She denies changes in vaginal discharge or concern for STDs.   Past Medical History:  Diagnosis Date  . Anxiety   . Depression   . H/O borderline personality disorder    stopped meds at beginning of pregnancy, Lamictal and Vyvanse  . Kidney stone   . UTI (urinary tract infection)      Immunizations up to date:  Yes.     Past Medical History:  Diagnosis Date  . Anxiety   . Depression   . H/O borderline personality disorder    stopped meds at beginning of pregnancy, Lamictal and Vyvanse  . Kidney stone   . UTI (urinary tract infection)     Patient Active Problem List   Diagnosis Date Noted  . Positive GBS test 11/22/2017  . Anxiety 11/22/2017  . Labor and delivery indication for care or intervention 11/21/2017    Past Surgical History:  Procedure Laterality Date  . COLONOSCOPY    . extraction of wisdom teeth      Prior to Admission medications   Medication Sig Start Date End Date Taking? Authorizing Provider  acetaminophen (TYLENOL) 325 MG tablet Take 2 tablets (650 mg total) by mouth every 6 (six) hours as needed for mild pain or moderate pain. 11/24/17   Lisette Grinder, CNM  cephALEXin (KEFLEX) 500 MG capsule Take 1 capsule (500 mg total) by mouth 3 (three) times daily for 7 days. 09/02/19 09/09/19  Lannie Fields, PA-C  ibuprofen (ADVIL,MOTRIN) 600 MG tablet Take 1 tablet  (600 mg total) by mouth every 6 (six) hours as needed for mild pain, moderate pain or cramping. 11/24/17   Lisette Grinder, CNM  LORazepam (ATIVAN) 0.5 MG tablet Take 1 tablet (0.5 mg total) by mouth every 12 (twelve) hours as needed for anxiety. 11/24/17   Lisette Grinder, CNM    Allergies Ceclor [cefaclor]  Family History  Problem Relation Age of Onset  . Cancer Mother   . Cancer Other     Social History Social History   Tobacco Use  . Smoking status: Never Smoker  . Smokeless tobacco: Never Used  Substance Use Topics  . Alcohol use: Not Currently    Comment: social  . Drug use: No     Review of Systems  Constitutional: No fever/chills Eyes:  No discharge ENT: No upper respiratory complaints. Respiratory: no cough. No SOB/ use of accessory muscles to breath Gastrointestinal:   No nausea, no vomiting.  No diarrhea.  No constipation.  Genitourinary: Patient has pelvic cramping and concern for vaginal bleeding.  Musculoskeletal: Negative for musculoskeletal pain. Skin: Negative for rash, abrasions, lacerations, ecchymosis.    ____________________________________________   PHYSICAL EXAM:  VITAL SIGNS: ED Triage Vitals  Enc Vitals Group     BP 09/02/19 1605 (!) 127/100     Pulse Rate 09/02/19 1605 90     Resp 09/02/19 1605 18     Temp 09/02/19 1605 98.2 F (  36.8 C)     Temp Source 09/02/19 1605 Oral     SpO2 09/02/19 1605 100 %     Weight 09/02/19 1606 160 lb (72.6 kg)     Height 09/02/19 1606 5\' 8"  (1.727 m)     Head Circumference --      Peak Flow --      Pain Score 09/02/19 1606 7     Pain Loc --      Pain Edu? --      Excl. in GC? --      Constitutional: Alert and oriented. Well appearing and in no acute distress. Eyes: Conjunctivae are normal. PERRL. EOMI. Head: Atraumatic. Cardiovascular: Normal rate, regular rhythm. Normal S1 and S2.  Good peripheral circulation. Respiratory: Normal respiratory effort without tachypnea or retractions. Lungs  CTAB. Good air entry to the bases with no decreased or absent breath sounds Gastrointestinal: Bowel sounds x 4 quadrants. Soft and nontender to palpation. No guarding or rigidity. No distention. Musculoskeletal: Full range of motion to all extremities. No obvious deformities noted Neurologic:  Normal for age. No gross focal neurologic deficits are appreciated.  Skin:  Skin is warm, dry and intact. No rash noted. Psychiatric: Mood and affect are normal for age. Speech and behavior are normal.   ____________________________________________   LABS (all labs ordered are listed, but only abnormal results are displayed)  Labs Reviewed  CBC - Abnormal; Notable for the following components:      Result Value   RBC 3.86 (*)    HCT 34.6 (*)    All other components within normal limits  BASIC METABOLIC PANEL - Abnormal; Notable for the following components:   Glucose, Bld 103 (*)    All other components within normal limits  HCG, QUANTITATIVE, PREGNANCY - Abnormal; Notable for the following components:   hCG, Beta Chain, Quant, S 53,536 (*)    All other components within normal limits  URINALYSIS, COMPLETE (UACMP) WITH MICROSCOPIC - Abnormal; Notable for the following components:   Color, Urine YELLOW (*)    APPearance HAZY (*)    Leukocytes,Ua MODERATE (*)    Bacteria, UA RARE (*)    All other components within normal limits   ____________________________________________  EKG   ____________________________________________  RADIOLOGY 09/04/19, personally viewed and evaluated these images (plain radiographs) as part of my medical decision making, as well as reviewing the written report by the radiologist.    [facility] P[facility] OB Limited  Result Date: 09/02/2019 CLINICAL DATA:  Pelvic pain and vaginal bleeding. Approximately [redacted] weeks pregnant. Beta HCG 53,536. EXAM: LIMITED OBSTETRIC ULTRASOUND AN DOPPLER FINDINGS: Number of Fetuses: 1 Heart Rate:  148 bpm Movement: Visualized Presentation:  Breech Placental Location: Posterior Previa: No Amniotic Fluid (Subjective):  Within normal limits. BPD:  3.9cm 17w 6d MATERNAL FINDINGS: Cervix:  Appears closed. Uterus/Adnexae: Normal appearing uterus. Both ovaries have normal appearances. The right ovary measures 3.7 x 2.5 x 2.0 cm with a calculated volume of 9.4 cc. The left ovary measures 3.6 x 2.3 x 1.9 cm with a calculated volume of 8.5 cc. Pulsed Doppler evaluation of both ovaries demonstrates normal low-resistance arterial and venous waveforms. Other findings No free fluid. IMPRESSION: 1. Single live intrauterine gestation in a breech presentation with an estimated gestational age of [redacted] weeks and 6 days with no complicating features. 2. Both ovaries are well visualized and demonstrate normal blood flow with no evidence of ovarian torsion. This exam is performed on an emergent basis and does not comprehensively evaluate  fetal size, dating, or anatomy; follow-up complete OB US should be considered if further fetal assessment is warranted. Electronically Signed   By: Beckie Salts M.D.   On: 09/02/2019 20:45   US PELVIC DOPPLER (TORSION R/O OR MASS ARTERIAL FLOW)  Result Date: 09/02/2019 CLINICAL DATA:  Pelvic pain and vaginal bleeding. Approximately [redacted] weeks pregnant. Beta HCG 53,536. EXAM: LIMITED OBSTETRIC ULTRASOUND AN DOPPLER FINDINGS: Number of Fetuses: 1 Heart Rate:  148 bpm Movement: Visualized Presentation: Breech Placental Location: Posterior Previa: No Amniotic Fluid (Subjective):  Within normal limits. BPD:  3.9cm 17w 6d MATERNAL FINDINGS: Cervix:  Appears closed. Uterus/Adnexae: Normal appearing uterus. Both ovaries have normal appearances. The right ovary measures 3.7 x 2.5 x 2.0 cm with a calculated volume of 9.4 cc. The left ovary measures 3.6 x 2.3 x 1.9 cm with a calculated volume of 8.5 cc. Pulsed Doppler evaluation of both ovaries demonstrates normal low-resistance arterial and venous waveforms. Other findings No free fluid. IMPRESSION:  1. Single live intrauterine gestation in a breech presentation with an estimated gestational age of [redacted] weeks and 6 days with no complicating features. 2. Both ovaries are well visualized and demonstrate normal blood flow with no evidence of ovarian torsion. This exam is performed on an emergent basis and does not comprehensively evaluate fetal size, dating, or anatomy; follow-up complete OB US should be considered if further fetal assessment is warranted. Electronically Signed   By: Beckie Salts M.D.   On: 09/02/2019 20:45    ____________________________________________    PROCEDURES  Procedure(s) performed:     Procedures     Medications  acetaminophen (TYLENOL) tablet 1,000 mg (1,000 mg Oral Given 09/02/19 1914)     ____________________________________________   INITIAL IMPRESSION / ASSESSMENT AND PLAN / ED COURSE  Pertinent labs & imaging results that were available during my care of the patient were reviewed by me and considered in my medical decision making (see chart for details).  Clinical Course as of Sep 02 2202  Sat Sep 02, 2019  2048 US OB Limited [JW]    Clinical Course User Index [JW] Pia Mau M, PA-C     Assessment and Plan:  Vaginal bleeding 29 year old female presents to the emergency department with concern for vaginal bleeding and cramping with that started this afternoon.  Vital signs are reassuring at triage.  On physical exam, abdomen was soft and nontender without guarding.  CBC revealed reassuring H&H with no leukocytosis.  BMP was reassuring.  Urinalysis did reveal a moderate amount of leukocytes and rare bacteria.  Pelvic ultrasound revealed single viable intrauterine pregnancy at 17 weeks and 6 days.  No evidence of subchorionic hematoma.  Patient was advised to follow-up with OB/GYN.  She was started on Keflex given bacteria and leukocytes identified in urinalysis.  Return precautions were given to return with new or worsening symptoms.   All patient questions were answered.  ____________________________________________  FINAL CLINICAL IMPRESSION(S) / ED DIAGNOSES  Final diagnoses:  Pelvic pain  Vaginal bleeding      NEW MEDICATIONS STARTED DURING THIS VISIT:  ED Discharge Orders         Ordered    cephALEXin (KEFLEX) 500 MG capsule  3 times daily     09/02/19 2053              This chart was dictated using voice recognition software/Dragon. Despite best efforts to proofread, errors can occur which can change the meaning. Any change was purely unintentional.     Pia Mau  Blair Heys 09/02/19 2207    Sharman Cheek, MD 09/02/19 [phone]

## 2019-09-02 NOTE — ED Triage Notes (Addendum)
Pt states vaginal bleeding began today with abd cramps. Pt states noticed pink on tissue paper after urinating. States cramping is why she came in. Sees [facility] for OB. 2nd pregnancy, 1 living child. Pt A&O, ambulatory. Pt states [redacted] weeks pregnant today.

## 2019-09-02 NOTE — ED Notes (Signed)
First Nurse Note: Pt to ED via POV, pt states that she was instructed to come to the ED by her OBGYN. Pt is having vaginal bleeding and abd cramping. Pt is in NAD. Pt is [redacted] weeks pregnant.

## 2019-09-14 ENCOUNTER — Observation Stay
Admission: EM | Admit: 2019-09-14 | Discharge: 2019-09-15 | Disposition: A | Discharge status: Home / Self Care | Payer: BC Managed Care – PPO

## 2019-09-14 DIAGNOSIS — F909 Attention-deficit hyperactivity disorder, unspecified type: Secondary | ICD-10-CM | POA: Diagnosis not present

## 2019-09-14 DIAGNOSIS — O99342 Other mental disorders complicating pregnancy, second trimester: Secondary | ICD-10-CM | POA: Insufficient documentation

## 2019-09-14 DIAGNOSIS — Z3A18 18 weeks gestation of pregnancy: Secondary | ICD-10-CM | POA: Insufficient documentation

## 2019-09-14 DIAGNOSIS — Z79899 Other long term (current) drug therapy: Secondary | ICD-10-CM | POA: Diagnosis not present

## 2019-09-14 DIAGNOSIS — F419 Anxiety disorder, unspecified: Secondary | ICD-10-CM | POA: Diagnosis not present

## 2019-09-14 DIAGNOSIS — Z87442 Personal history of urinary calculi: Secondary | ICD-10-CM | POA: Diagnosis not present

## 2019-09-14 DIAGNOSIS — E739 Lactose intolerance, unspecified: Secondary | ICD-10-CM | POA: Diagnosis not present

## 2019-09-14 DIAGNOSIS — O99282 Endocrine, nutritional and metabolic diseases complicating pregnancy, second trimester: Secondary | ICD-10-CM | POA: Insufficient documentation

## 2019-09-14 DIAGNOSIS — O211 Hyperemesis gravidarum with metabolic disturbance: Principal | ICD-10-CM | POA: Insufficient documentation

## 2019-09-14 DIAGNOSIS — O219 Vomiting of pregnancy, unspecified: Secondary | ICD-10-CM | POA: Diagnosis present

## 2019-09-14 DIAGNOSIS — F329 Major depressive disorder, single episode, unspecified: Secondary | ICD-10-CM | POA: Insufficient documentation

## 2019-09-14 DIAGNOSIS — Z800 Family history of malignant neoplasm of digestive organs: Secondary | ICD-10-CM | POA: Insufficient documentation

## 2019-09-14 DIAGNOSIS — F603 Borderline personality disorder: Secondary | ICD-10-CM | POA: Diagnosis not present

## 2019-09-14 DIAGNOSIS — Z881 Allergy status to other antibiotic agents status: Secondary | ICD-10-CM | POA: Diagnosis not present

## 2019-09-14 DIAGNOSIS — Z791 Long term (current) use of non-steroidal anti-inflammatories (NSAID): Secondary | ICD-10-CM | POA: Insufficient documentation

## 2019-09-14 LAB — COMPREHENSIVE METABOLIC PANEL
ALT: 9 U/L (ref 0–44)
AST: 13 U/L — ABNORMAL LOW (ref 15–41)
Albumin: 3.4 g/dL — ABNORMAL LOW (ref 3.5–5.0)
Alkaline Phosphatase: 45 U/L (ref 38–126)
Anion gap: 8 (ref 5–15)
BUN: 9 mg/dL (ref 6–20)
CO2: 21 mmol/L — ABNORMAL LOW (ref 22–32)
Calcium: 8.8 mg/dL — ABNORMAL LOW (ref 8.9–10.3)
Chloride: 107 mmol/L (ref 98–111)
Creatinine, Ser: 0.44 mg/dL (ref 0.44–1.00)
GFR calc Af Amer: >60 mL/min (ref >60)
GFR calc non Af Amer: >60 mL/min (ref >60)
Glucose, Bld: 94 mg/dL (ref 70–99)
Potassium: 3.6 mmol/L (ref 3.5–5.1)
Sodium: 136 mmol/L (ref 135–145)
Total Bilirubin: 0.5 mg/dL (ref 0.3–1.2)
Total Protein: 6.6 g/dL (ref 6.5–8.1)

## 2019-09-14 LAB — CBC
HCT: 32.6 % — ABNORMAL LOW (ref 36.0–46.0)
Hemoglobin: 11.3 g/dL — ABNORMAL LOW (ref 12.0–15.0)
MCH: 31.7 pg (ref 26.0–34.0)
MCHC: 34.7 g/dL (ref 30.0–36.0)
MCV: 91.3 fL (ref 80.0–100.0)
Platelets: 211 10*3/uL (ref 150–400)
RBC: 3.57 MIL/uL — ABNORMAL LOW (ref 3.87–5.11)
RDW: 13.3 % (ref 11.5–15.5)
WBC: 10.7 10*3/uL — ABNORMAL HIGH (ref 4.0–10.5)
nRBC: 0 % (ref 0.0–0.2)

## 2019-09-14 LAB — URINALYSIS, COMPLETE (UACMP) WITH MICROSCOPIC
Bilirubin Urine: NEGATIVE
Glucose, UA: NEGATIVE mg/dL
Hgb urine dipstick: NEGATIVE
Ketones, ur: NEGATIVE mg/dL
Nitrite: NEGATIVE
Protein, ur: NEGATIVE mg/dL
Specific Gravity, Urine: 1.013 (ref 1.005–1.030)
pH: 6 (ref 5.0–8.0)

## 2019-09-14 MED ORDER — METOCLOPRAMIDE HCL 5 MG/ML IJ SOLN: 10.0000 mg | Freq: Once | INTRAMUSCULAR | Status: DC | PRN

## 2019-09-14 MED ORDER — ONDANSETRON HCL 4 MG/2ML IJ SOLN
4.0000 mg | Freq: Four times a day (QID) | INTRAMUSCULAR | Status: DC | PRN
  Administered 2019-09-14 – 2019-09-15 (×2): 4 mg via INTRAVENOUS
  Filled 2019-09-14 (×2): qty 2

## 2019-09-14 MED ORDER — DEXTROSE IN LACTATED RINGERS 5 % IV SOLN: INTRAVENOUS | Status: DC

## 2019-09-14 MED ORDER — BUSPIRONE HCL 5 MG PO TABS
5.0000 mg | ORAL_TABLET | Freq: Two times a day (BID) | ORAL | Status: DC
  Administered 2019-09-14: 5 mg via ORAL
  Filled 2019-09-14 (×2): qty 1

## 2019-09-14 MED ORDER — LACTATED RINGERS IV BOLUS
1000.0000 mL | Freq: Once | INTRAVENOUS | Status: AC
  Administered 2019-09-14: 1000 mL via INTRAVENOUS

## 2019-09-14 MED ORDER — SERTRALINE HCL 100 MG PO TABS
100.0000 mg | ORAL_TABLET | Freq: Every day | ORAL | Status: DC
  Administered 2019-09-14: 21:00:00 100 mg via ORAL
  Filled 2019-09-14: qty 1

## 2019-09-14 MED ORDER — PROMETHAZINE HCL 25 MG/ML IJ SOLN
12.5000 mg | Freq: Four times a day (QID) | INTRAMUSCULAR | Status: DC | PRN
  Administered 2019-09-14: 12.5 mg via INTRAVENOUS
  Filled 2019-09-14: qty 1

## 2019-09-14 NOTE — ED Triage Notes (Signed)
First Nurse Note:  Patient states she is [redacted] weeks pregnant and was seen at [facility] today and had a syncopal episode.  Patient states her provider told her to come to Labor and Delivery for fluids.  This RN called to Labor and Delivery and they spoke to a [facility] provider and they stated she needed to be seen in the ED.  Patient states if she has to be seen in the ED, she will leave.

## 2019-09-14 NOTE — Discharge Instructions (Signed)
Hyperemesis Gravidarum Hyperemesis gravidarum is a severe form of nausea and vomiting that happens during pregnancy. Hyperemesis is worse than morning sickness. It may cause you to have nausea or vomiting all day for many days. It may keep you from eating and drinking enough food and liquids, which can lead to dehydration, malnutrition, and weight loss. Hyperemesis usually occurs during the first half (the first 20 weeks) of pregnancy. It often goes away once a woman is in her second half of pregnancy. However, sometimes hyperemesis continues through an entire pregnancy. What are the causes? The cause of this condition is not known. It may be related to changes in chemicals (hormones) in the body during pregnancy, such as the high level of pregnancy hormone (human chorionic gonadotropin) or the increase in the female sex hormone (estrogen). What are the signs or symptoms? Symptoms of this condition include:  Nausea that does not go away.  Vomiting that does not allow you to keep any food down.  Weight loss.  Body fluid loss (dehydration).  Having no desire to eat, or not liking food that you have previously enjoyed. How is this diagnosed? This condition may be diagnosed based on:  A physical exam.  Your medical history.  Your symptoms.  Blood tests.  Urine tests. How is this treated? This condition is managed by controlling symptoms. This may include:  Following an eating plan. This can help lessen nausea and vomiting.  Taking prescription medicines. An eating plan and medicines are often used together to help control symptoms. If medicines do not help relieve nausea and vomiting, you may need to receive fluids through an IV at the hospital. Follow these instructions at home: Eating and drinking   Avoid the following: ? Drinking fluids with meals. Try not to drink anything during the 30 minutes before and after your meals. ? Drinking more than 1 cup of fluid at a  time. ? Eating foods that trigger your symptoms. These may include spicy foods, coffee, high-fat foods, very sweet foods, and acidic foods. ? Skipping meals. Nausea can be more intense on an empty stomach. If you cannot tolerate food, do not force it. Try sucking on ice chips or other frozen items and make up for missed calories later. ? Lying down within 2 hours after eating. ? Being exposed to environmental triggers. These may include food smells, smoky rooms, closed spaces, rooms with strong smells, warm or humid places, overly loud and noisy rooms, and rooms with motion or flickering lights. Try eating meals in a well-ventilated area that is free of strong smells. ? Quick and sudden changes in your movement. ? Taking iron pills and multivitamins that contain iron. If you take prescription iron pills, do not stop taking them unless your health care provider approves. ? Preparing food. The smell of food can spoil your appetite or trigger nausea.  To help relieve your symptoms: ? Listen to your body. Everyone is different and has different preferences. Find what works best for you. ? Eat and drink slowly. ? Eat 5-6 small meals daily instead of 3 large meals. Eating small meals and snacks can help you avoid an empty stomach. ? In the morning, before getting out of bed, eat a couple of crackers to avoid moving around on an empty stomach. ? Try eating starchy foods as these are usually tolerated well. Examples include cereal, toast, bread, potatoes, pasta, rice, and pretzels. ? Include at least 1 serving of protein with your meals and snacks. Protein options include   lean meats, poultry, seafood, beans, nuts, nut butters, eggs, cheese, and yogurt. ? Try eating a protein-rich snack before bed. Examples of a protein-rick snack include cheese and crackers or a peanut butter sandwich made with 1 slice of whole-wheat bread and 1 tsp (5 g) of peanut butter. ? Eat or suck on things that have ginger in them.  It may help relieve nausea. Add  tsp ground ginger to hot tea or choose ginger tea. ? Try drinking 100% fruit juice or an electrolyte drink. An electrolyte drink contains sodium, potassium, and chloride. ? Drink fluids that are cold, clear, and carbonated or sour. Examples include lemonade, ginger ale, lemon-lime soda, ice water, and sparkling water. ? Brush your teeth or use a mouth rinse after meals. ? Talk with your health care provider about starting a supplement of vitamin B6. General instructions  Take over-the-counter and prescription medicines only as told by your health care provider.  Follow instructions from your health care provider about eating or drinking restrictions.  Continue to take your prenatal vitamins as told by your health care provider. If you are having trouble taking your prenatal vitamins, talk with your health care provider about different options.  Keep all follow-up and pre-birth (prenatal) visits as told by your health care provider. This is important. Contact a health care provider if:  You have pain in your abdomen.  You have a severe headache.  You have vision problems.  You are losing weight.  You feel weak or dizzy. Get help right away if:  You cannot drink fluids without vomiting.  You vomit blood.  You have constant nausea and vomiting.  You are very weak.  You faint.  You have a fever and your symptoms suddenly get worse. Summary  Hyperemesis gravidarum is a severe form of nausea and vomiting that happens during pregnancy.  Making some changes to your eating habits may help relieve nausea and vomiting.  This condition may be managed with medicine.  If medicines do not help relieve nausea and vomiting, you may need to receive fluids through an IV at the hospital. This information is not intended to replace advice given to you by your health care provider. Make sure you discuss any questions you have with your health care  provider. Document Revised: 05/24/2017 Document Reviewed: 01/01/2016 Elsevier Patient Education  2020 [employer]  

## 2019-09-14 NOTE — Discharge Summary (Addendum)
Patient ID: Tiffany Whitehead MRN: [id number] DOB/AGE: 15-Oct-1990 29 y.o.  Admit date: 09/14/2019 Discharge date: 09/15/2019  Admission Diagnoses: nausea and vomiting of pregnancy, dehydration   Discharge Diagnoses: same, hyperemesis gravidarum   Prenatal Procedures: none  Consults: Dr. Leonides Schanz, [facility]   Significant Diagnostic Studies:  Results for orders placed or performed during the hospital encounter of 09/14/19 (from the past 168 hour(s))  CBC on admission   Collection Time: 09/14/19  7:07 PM  Result Value Ref Range   WBC 10.7 (H) 4.0 - 10.5 K/uL   RBC 3.57 (L) 3.87 - 5.11 MIL/uL   Hemoglobin 11.3 (L) 12.0 - 15.0 g/dL   HCT 32.6 (L) 36.0 - 46.0 %   MCV 91.3 80.0 - 100.0 fL   MCH 31.7 26.0 - 34.0 pg   MCHC 34.7 30.0 - 36.0 g/dL   RDW 13.3 11.5 - 15.5 %   Platelets 211 150 - 400 K/uL   nRBC 0.0 0.0 - 0.2 %  Comprehensive metabolic panel   Collection Time: 09/14/19  7:07 PM  Result Value Ref Range   Sodium 136 135 - 145 mmol/L   Potassium 3.6 3.5 - 5.1 mmol/L   Chloride 107 98 - 111 mmol/L   CO2 21 (L) 22 - 32 mmol/L   Glucose, Bld 94 70 - 99 mg/dL   BUN 9 6 - 20 mg/dL   Creatinine, Ser 0.44 0.44 - 1.00 mg/dL   Calcium 8.8 (L) 8.9 - 10.3 mg/dL   Total Protein 6.6 6.5 - 8.1 g/dL   Albumin 3.4 (L) 3.5 - 5.0 g/dL   AST 13 (L) 15 - 41 U/L   ALT 9 0 - 44 U/L   Alkaline Phosphatase 45 38 - 126 U/L   Total Bilirubin 0.5 0.3 - 1.2 mg/dL   GFR calc non Af Amer >60 >60 mL/min   GFR calc Af Amer >60 >60 mL/min   Anion gap 8 5 - 15  Urinalysis, Complete w Microscopic   Collection Time: 09/14/19  7:14 PM  Result Value Ref Range   Color, Urine YELLOW (A) YELLOW   APPearance HAZY (A) CLEAR   Specific Gravity, Urine 1.013 1.005 - 1.030   pH 6.0 5.0 - 8.0   Glucose, UA NEGATIVE NEGATIVE mg/dL   Hgb urine dipstick NEGATIVE NEGATIVE   Bilirubin Urine NEGATIVE NEGATIVE   Ketones, ur NEGATIVE NEGATIVE mg/dL   Protein, ur NEGATIVE NEGATIVE mg/dL   Nitrite NEGATIVE NEGATIVE   Leukocytes,Ua MODERATE (A) NEGATIVE   RBC / HPF 0-5 0 - 5 RBC/hpf   WBC, UA 0-5 0 - 5 WBC/hpf   Bacteria, UA RARE (A) NONE SEEN   Squamous Epithelial / LPF 0-5 0 - 5   Mucus PRESENT     Treatments: IV hydration and antiemetics   Hospital Course:  This is a 29 y.o. G3P1011 with IUP at [redacted]w[redacted]d admitted for nausea and vomiting in pregnancy with dehydration.  No leaking of fluid and no bleeding.  FHT reassuring at 147bpm.  IV fluids x 2 liters were given along with antiemetics.  Labs for dehydration were stable.  She was able to tolerate PO ice chips and fluids prior to discharge.  She was deemed stable for discharge to home with outpatient follow up.  Discharge Physical Exam:  BP 102/67   Pulse 77   Temp 98 F (36.7 C) (Oral)   Resp 18   SpO2 100%   General: NAD CV: RRR Pulm: CTABL, nl effort ABD: s/nd/nt, gravid DVT Evaluation: LE  non-ttp, no evidence of DVT on exam.  Doppler: 147bpm, distinguished from maternal pulse   Discharge Condition: Stable  Disposition: Discharge disposition: 01-Home or Self Care       Allergies as of 09/14/2019      Reactions   Ceclor [cefaclor] Rash      Medication List    STOP taking these medications   ibuprofen 600 MG tablet Commonly known as: ADVIL     TAKE these medications   acetaminophen 325 MG tablet Commonly known as: TYLENOL Take 2 tablets (650 mg total) by mouth every 6 (six) hours as needed for mild pain or moderate pain.   busPIRone 5 MG tablet Commonly known as: BUSPAR Take 5 mg by mouth 2 (two) times daily.   LORazepam 0.5 MG tablet Commonly known as: ATIVAN Take 1 tablet (0.5 mg total) by mouth every 12 (twelve) hours as needed for anxiety.   multivitamin-prenatal 27-0.8 MG Tabs tablet Take 1 tablet by mouth daily.   ondansetron 4 MG disintegrating tablet Commonly known as: ZOFRAN-ODT Take 4 mg by mouth every 6 (six) hours as needed.   promethazine 25 MG suppository Commonly known as: PHENERGAN Place 25 mg  rectally every 6 (six) hours as needed.   scopolamine 1 MG/3DAYS Commonly known as: TRANSDERM-SCOP Place 1 patch onto the skin every 3 (three) days.   sertraline 100 MG tablet Commonly known as: ZOLOFT Take 100 mg by mouth at bedtime.      Follow-up Information    Gustavo Lah, CNM Follow up in 1 week(s).   Specialty: Certified Nurse Midwife Why: for follow up prenatal visit and weight check  Contact information: [address] [place] [address] [phone]           Signed: ----- Margaretmary Eddy, CNM Certified Nurse Midwife [facility]

## 2019-09-14 NOTE — H&P (Signed)
HISTORY AND PHYSICAL NOTE  History of Present Illness: Tiffany Whitehead is a 29 y.o. G3P1011 at [redacted]w[redacted]d admitted for dehydration d/t hyperemesis gravidarum.  She was evaluated in the office today and sent for IV hydration, evaluation of electrolyte status, and administration of antiemetics as needed.  She has been having increased nausea and vomiting in her pregnancy.  Currently unable to tolerate PO medication.  Reports that scopolamine patch is helpful though it can make her tired, and phenergan also makes her tired.  She is finding it difficult to balance medications with side effects and n/v.  Patient Active Problem List   Diagnosis Date Noted  . Nausea and vomiting during pregnancy 09/14/2019  . Supervision of normal pregnancy 07/10/2019  . Anxiety 11/22/2017  . GAD (generalized anxiety disorder) 07/04/2015  . ADHD (attention deficit hyperactivity disorder), combined type 07/04/2015  . Family history of colon cancer 07/04/2015  . Lactose intolerance 07/04/2015  . Transient gluten sensitivity 07/04/2015    Past Medical History:  Diagnosis Date  . Anxiety   . Depression   . H/O borderline personality disorder    stopped meds at beginning of pregnancy, Lamictal and Vyvanse  . Kidney stone   . UTI (urinary tract infection)     Past Surgical History:  Procedure Laterality Date  . COLONOSCOPY    . extraction of wisdom teeth     Social History:  reports that she has never smoked. She has never used smokeless tobacco. She reports previous alcohol use. She reports that she does not use drugs.   OB History  Gravida Para Term Preterm AB Living  3 1 1   1 1   SAB TAB Ectopic Multiple Live Births  1     0 1    # Outcome Date GA Lbr Len/2nd Weight Sex Delivery Anes PTL Lv  3 Current           2 Term 11/22/17 [redacted]w[redacted]d 31:13 / 00:49 4500 g F Vag-Spont EPI  LIV  1 SAB               Family History  Problem Relation Age of Onset  . Cancer Mother   . Cancer Other     Allergies   Allergen Reactions  . Ceclor [Cefaclor] Rash    Medications Prior to Admission  Medication Sig Dispense Refill Last Dose  . ondansetron (ZOFRAN-ODT) 4 MG disintegrating tablet Take 4 mg by mouth every 6 (six) hours as needed.     . promethazine (PHENERGAN) 25 MG suppository Place 25 mg rectally every 6 (six) hours as needed.     [redacted]w[redacted]d scopolamine (TRANSDERM-SCOP) 1 MG/3DAYS Place 1 patch onto the skin every 3 (three) days.     Marland Kitchen acetaminophen (TYLENOL) 325 MG tablet Take 2 tablets (650 mg total) by mouth every 6 (six) hours as needed for mild pain or moderate pain. 90 tablet 0   . busPIRone (BUSPAR) 5 MG tablet Take 5 mg by mouth 2 (two) times daily.     Marland Kitchen ibuprofen (ADVIL,MOTRIN) 600 MG tablet Take 1 tablet (600 mg total) by mouth every 6 (six) hours as needed for mild pain, moderate pain or cramping. 90 tablet 0   . LORazepam (ATIVAN) 0.5 MG tablet Take 1 tablet (0.5 mg total) by mouth every 12 (twelve) hours as needed for anxiety. 15 tablet 0   . Prenatal Vit-Fe Fumarate-FA (MULTIVITAMIN-PRENATAL) 27-0.8 MG TABS tablet Take 1 tablet by mouth daily.     . sertraline (ZOLOFT) 100 MG tablet Take  100 mg by mouth at bedtime.       Review of Systems - Negative except symptoms listed below General ROS: positive for  - fatigue and weight loss negative for - chills or fever Psychological ROS: positive for - anxiety Gastrointestinal ROS: positive for - nausea/vomiting negative for - abdominal pain or diarrhea Neurological ROS: positive for - dizziness  Vitals:  BP 108/76   Pulse 74   Temp 98.4 F (36.9 C) (Oral)   Resp 20   SpO2 100%  Physical Examination: CONSTITUTIONAL: Well-developed, well-nourished female in no acute distress.  HENT:  Normocephalic, atraumatic, External right and left ear normal. Oropharynx is clear and moist EYES: Conjunctivae and EOM are normal. Pupils are equal, round, and reactive to light. No scleral icterus.  NECK: Normal range of motion, supple, no masses SKIN:  Skin is warm and dry. No rash noted. Not diaphoretic. No erythema. No pallor. NEUROLGIC: Alert and oriented to person, place, and time. Normal reflexes, muscle tone coordination. No cranial nerve deficit noted. PSYCHIATRIC: Appears anxious and tearful, though mood and affect improving since arrival to unit. Normal behavior. Normal judgment and thought content. CARDIOVASCULAR: Normal heart rate noted, regular rhythm RESPIRATORY: Effort and breath sounds normal, no problems with respiration noted ABDOMEN: Soft, nontender, nondistended, gravid, c/w dates  MUSCULOSKELETAL: Normal range of motion. No edema and no tenderness. 2+ distal pulses. PELVIC: deferred   Labs:  Results for orders placed or performed during the hospital encounter of 09/14/19 (from the past 24 hour(s))  CBC on admission   Collection Time: 09/14/19  7:07 PM  Result Value Ref Range   WBC 10.7 (H) 4.0 - 10.5 K/uL   RBC 3.57 (L) 3.87 - 5.11 MIL/uL   Hemoglobin 11.3 (L) 12.0 - 15.0 g/dL   HCT 81.0 (L) 17.5 - 10.2 %   MCV 91.3 80.0 - 100.0 fL   MCH 31.7 26.0 - 34.0 pg   MCHC 34.7 30.0 - 36.0 g/dL   RDW 58.5 27.7 - 82.4 %   Platelets 211 150 - 400 K/uL   nRBC 0.0 0.0 - 0.2 %  Comprehensive metabolic panel   Collection Time: 09/14/19  7:07 PM  Result Value Ref Range   Sodium 136 135 - 145 mmol/L   Potassium 3.6 3.5 - 5.1 mmol/L   Chloride 107 98 - 111 mmol/L   CO2 21 (L) 22 - 32 mmol/L   Glucose, Bld 94 70 - 99 mg/dL   BUN 9 6 - 20 mg/dL   Creatinine, Ser 2.35 0.44 - 1.00 mg/dL   Calcium 8.8 (L) 8.9 - 10.3 mg/dL   Total Protein 6.6 6.5 - 8.1 g/dL   Albumin 3.4 (L) 3.5 - 5.0 g/dL   AST 13 (L) 15 - 41 U/L   ALT 9 0 - 44 U/L   Alkaline Phosphatase 45 38 - 126 U/L   Total Bilirubin 0.5 0.3 - 1.2 mg/dL   GFR calc non Af Amer >60 >60 mL/min   GFR calc Af Amer >60 >60 mL/min   Anion gap 8 5 - 15  Urinalysis, Complete w Microscopic   Collection Time: 09/14/19  7:14 PM  Result Value Ref Range   Color, Urine YELLOW (A)  YELLOW   APPearance HAZY (A) CLEAR   Specific Gravity, Urine 1.013 1.005 - 1.030   pH 6.0 5.0 - 8.0   Glucose, UA NEGATIVE NEGATIVE mg/dL   Hgb urine dipstick NEGATIVE NEGATIVE   Bilirubin Urine NEGATIVE NEGATIVE   Ketones, ur NEGATIVE NEGATIVE mg/dL  Protein, ur NEGATIVE NEGATIVE mg/dL   Nitrite NEGATIVE NEGATIVE   Leukocytes,Ua MODERATE (A) NEGATIVE   RBC / HPF 0-5 0 - 5 RBC/hpf   WBC, UA 0-5 0 - 5 WBC/hpf   Bacteria, UA RARE (A) NONE SEEN   Squamous Epithelial / LPF 0-5 0 - 5   Mucus PRESENT     Assessment and Plan: Patient Active Problem List   Diagnosis Date Noted  . Nausea and vomiting during pregnancy 09/14/2019  . Supervision of normal pregnancy 07/10/2019  . Anxiety 11/22/2017  . GAD (generalized anxiety disorder) 07/04/2015  . ADHD (attention deficit hyperactivity disorder), combined type 07/04/2015  . Family history of colon cancer 07/04/2015  . Lactose intolerance 07/04/2015  . Transient gluten sensitivity 07/04/2015   1. Admit to Antenatal for IV hydration - Dr. Leonides Schanz updated on plan of care  2. Antiemetics ordered as needed for n/v 3. NPO, will progress to ice chips then sips of water as tolerated  4. Doppler for FHT q 8 hours  Drinda Butts, CNM Certified Nurse Midwife [facility]

## 2019-09-15 DIAGNOSIS — O211 Hyperemesis gravidarum with metabolic disturbance: Secondary | ICD-10-CM | POA: Diagnosis not present

## 2019-09-15 NOTE — Progress Notes (Signed)
Patient discharged home. Discharge instructions and prescriptions given and reviewed with patient. Patient verbalized understanding. Pt stated she already has 1-week follow-up appointment scheduled for next week.   Escorted out by staff.

## 2019-09-23 ENCOUNTER — Other Ambulatory Visit: Payer: Self-pay

## 2019-09-23 ENCOUNTER — Other Ambulatory Visit: Payer: Self-pay | Admitting: Certified Nurse Midwife

## 2019-09-23 ENCOUNTER — Observation Stay
Admission: EM | Admit: 2019-09-23 | Discharge: 2019-09-24 | Disposition: A | Discharge status: Home / Self Care | Payer: BC Managed Care – PPO | Attending: Certified Nurse Midwife | Admitting: Certified Nurse Midwife

## 2019-09-23 ENCOUNTER — Encounter: Payer: Self-pay | Admitting: Certified Nurse Midwife

## 2019-09-23 DIAGNOSIS — O210 Mild hyperemesis gravidarum: Secondary | ICD-10-CM | POA: Diagnosis present

## 2019-09-23 DIAGNOSIS — O99342 Other mental disorders complicating pregnancy, second trimester: Secondary | ICD-10-CM | POA: Insufficient documentation

## 2019-09-23 DIAGNOSIS — O211 Hyperemesis gravidarum with metabolic disturbance: Secondary | ICD-10-CM | POA: Diagnosis present

## 2019-09-23 DIAGNOSIS — Z881 Allergy status to other antibiotic agents status: Secondary | ICD-10-CM | POA: Diagnosis not present

## 2019-09-23 DIAGNOSIS — F329 Major depressive disorder, single episode, unspecified: Secondary | ICD-10-CM | POA: Insufficient documentation

## 2019-09-23 DIAGNOSIS — Z3A20 20 weeks gestation of pregnancy: Secondary | ICD-10-CM | POA: Diagnosis not present

## 2019-09-23 DIAGNOSIS — F411 Generalized anxiety disorder: Secondary | ICD-10-CM | POA: Diagnosis not present

## 2019-09-23 DIAGNOSIS — F603 Borderline personality disorder: Secondary | ICD-10-CM | POA: Insufficient documentation

## 2019-09-23 DIAGNOSIS — Z79899 Other long term (current) drug therapy: Secondary | ICD-10-CM | POA: Diagnosis not present

## 2019-09-23 DIAGNOSIS — F909 Attention-deficit hyperactivity disorder, unspecified type: Secondary | ICD-10-CM | POA: Diagnosis not present

## 2019-09-23 HISTORY — DX: Mild hyperemesis gravidarum: O21.0

## 2019-09-23 LAB — COMPREHENSIVE METABOLIC PANEL
ALT: 9 U/L (ref 0–44)
AST: 14 U/L — ABNORMAL LOW (ref 15–41)
Albumin: 3.4 g/dL — ABNORMAL LOW (ref 3.5–5.0)
Alkaline Phosphatase: 49 U/L (ref 38–126)
Anion gap: 8 (ref 5–15)
BUN: 12 mg/dL (ref 6–20)
CO2: 21 mmol/L — ABNORMAL LOW (ref 22–32)
Calcium: 8.9 mg/dL (ref 8.9–10.3)
Chloride: 106 mmol/L (ref 98–111)
Creatinine, Ser: 0.44 mg/dL (ref 0.44–1.00)
GFR calc Af Amer: >60 mL/min (ref >60)
GFR calc non Af Amer: >60 mL/min (ref >60)
Glucose, Bld: 95 mg/dL (ref 70–99)
Potassium: 3.7 mmol/L (ref 3.5–5.1)
Sodium: 135 mmol/L (ref 135–145)
Total Bilirubin: 0.5 mg/dL (ref 0.3–1.2)
Total Protein: 6.3 g/dL — ABNORMAL LOW (ref 6.5–8.1)

## 2019-09-23 LAB — URINALYSIS, COMPLETE (UACMP) WITH MICROSCOPIC
Bilirubin Urine: NEGATIVE
Glucose, UA: NEGATIVE mg/dL
Hgb urine dipstick: NEGATIVE
Ketones, ur: NEGATIVE mg/dL
Nitrite: NEGATIVE
Protein, ur: NEGATIVE mg/dL
Specific Gravity, Urine: 1.006 (ref 1.005–1.030)
pH: 6 (ref 5.0–8.0)

## 2019-09-23 MED ORDER — LACTATED RINGERS IV BOLUS
1000.0000 mL | Freq: Once | INTRAVENOUS | Status: AC
  Administered 2019-09-23: 20:00:00 1000 mL via INTRAVENOUS

## 2019-09-23 MED ORDER — THIAMINE HCL 100 MG/ML IJ SOLN
Freq: Once | INTRAVENOUS | Status: AC
  Filled 2019-09-23: qty 1000

## 2019-09-23 MED ORDER — PROMETHAZINE HCL 25 MG RE SUPP
25.0000 mg | Freq: Four times a day (QID) | RECTAL | Status: DC
  Filled 2019-09-23: qty 1

## 2019-09-23 MED ORDER — SERTRALINE HCL 100 MG PO TABS
100.0000 mg | ORAL_TABLET | Freq: Every day | ORAL | Status: DC
  Administered 2019-09-23: 100 mg via ORAL
  Filled 2019-09-23: qty 1

## 2019-09-23 MED ORDER — KCL IN DEXTROSE-NACL 20-5-0.45 MEQ/L-%-% IV SOLN
INTRAVENOUS | Status: DC
  Filled 2019-09-23 (×7): qty 1000

## 2019-09-23 MED ORDER — PROMETHAZINE HCL 25 MG PO TABS: 25.0000 mg | ORAL_TABLET | Freq: Four times a day (QID) | ORAL | Status: DC

## 2019-09-23 MED ORDER — PROMETHAZINE HCL 25 MG/ML IJ SOLN
25.0000 mg | Freq: Four times a day (QID) | INTRAMUSCULAR | Status: DC
  Administered 2019-09-23: 21:00:00 25 mg via INTRAVENOUS
  Filled 2019-09-23: qty 1

## 2019-09-23 MED ORDER — BUSPIRONE HCL 5 MG PO TABS
5.0000 mg | ORAL_TABLET | Freq: Two times a day (BID) | ORAL | Status: DC
  Administered 2019-09-23 – 2019-09-24 (×2): 5 mg via ORAL
  Filled 2019-09-23 (×2): qty 1

## 2019-09-23 MED ORDER — ONDANSETRON 4 MG PO TBDP
8.0000 mg | ORAL_TABLET | Freq: Three times a day (TID) | ORAL | Status: DC
  Administered 2019-09-24: 14:00:00 8 mg via ORAL
  Filled 2019-09-23: qty 2

## 2019-09-23 MED ORDER — PROMETHAZINE HCL 25 MG/ML IJ SOLN
25.0000 mg | Freq: Four times a day (QID) | INTRAMUSCULAR | Status: DC
  Administered 2019-09-24: 25 mg via INTRAVENOUS
  Filled 2019-09-23: qty 1

## 2019-09-23 MED ORDER — PRENATAL MULTIVITAMIN CH: 1.0000 | ORAL_TABLET | Freq: Every day | ORAL | Status: DC

## 2019-09-23 MED ORDER — METHYLPREDNISOLONE SODIUM SUCC 40 MG IJ SOLR
16.0000 mg | Freq: Three times a day (TID) | INTRAMUSCULAR | Status: DC
  Administered 2019-09-23: 21:00:00 16 mg via INTRAVENOUS
  Filled 2019-09-23: qty 1

## 2019-09-23 MED ORDER — SODIUM CHLORIDE 0.9 % IV SOLN
8.0000 mg | Freq: Three times a day (TID) | INTRAVENOUS | Status: DC
  Administered 2019-09-23 – 2019-09-24 (×2): 8 mg via INTRAVENOUS
  Filled 2019-09-23 (×3): qty 4

## 2019-09-23 MED ORDER — PROMETHAZINE HCL 25 MG PO TABS
25.0000 mg | ORAL_TABLET | Freq: Four times a day (QID) | ORAL | Status: DC
  Administered 2019-09-24 (×2): 25 mg via ORAL
  Filled 2019-09-23 (×2): qty 1

## 2019-09-23 MED ORDER — METHYLPREDNISOLONE SODIUM SUCC 40 MG IJ SOLR
16.0000 mg | Freq: Three times a day (TID) | INTRAMUSCULAR | Status: DC
  Administered 2019-09-24 (×2): 16 mg via INTRAVENOUS
  Filled 2019-09-23 (×2): qty 1

## 2019-09-23 NOTE — H&P (Signed)
OB History & Physical   History of Present Illness:  Chief Complaint: nausea and vomiting  HPI:  Tiffany Whitehead is a 29 y.o. G58P1011 female at [redacted]w[redacted]d dated by [redacted]w[redacted]d ultrasound. She presents to L&D for hyperemesis gravidarum, with keeping down no to minimal fluids or foods by mouth for the past 36 hours. She had been taking Reglan, Zofran, and Phenergan at home.   She reports:  -active fetal movement -no leakage of fluid -no vaginal bleeding -no cramping  Pregnancy Issues: 1. ADHD, no meds currently 2. Generalized anxiety: sees counselor, taking Zoloft 100mg  daily and Buspar 5mg  BID 3. History of postpartum depression 4. Bicornuate uterus 5. Hyperemesis gravidarum   Maternal Medical History:   Past Medical History:  Diagnosis Date  . Anxiety   . Depression   . H/O borderline personality disorder    stopped meds at beginning of pregnancy, Lamictal and Vyvanse  . Kidney stone   . UTI (urinary tract infection)     Past Surgical History:  Procedure Laterality Date  . COLONOSCOPY    . extraction of wisdom teeth      Allergies  Allergen Reactions  . Ceclor [Cefaclor] Rash    Prior to Admission medications   Medication Sig Start Date End Date Taking? Authorizing Provider  acetaminophen (TYLENOL) 325 MG tablet Take 2 tablets (650 mg total) by mouth every 6 (six) hours as needed for mild pain or moderate pain. 11/24/17   , CNM  busPIRone (BUSPAR) 5 MG tablet Take 5 mg by mouth 2 (two) times daily. 09/04/19   [provider]  LORazepam (ATIVAN) 0.5 MG tablet Take 1 tablet (0.5 mg total) by mouth every 12 (twelve) hours as needed for anxiety. 11/24/17   09/06/19, CNM  ondansetron (ZOFRAN-ODT) 4 MG disintegrating tablet Take 4 mg by mouth every 6 (six) hours as needed. 07/23/19   [provider]  Prenatal Vit-Fe Fumarate-FA (MULTIVITAMIN-PRENATAL) 27-0.8 MG TABS tablet Take 1 tablet by mouth daily.    [provider]  promethazine  (PHENERGAN) 25 MG suppository Place 25 mg rectally every 6 (six) hours as needed. 07/27/19   [provider]  scopolamine (TRANSDERM-SCOP) 1 MG/3DAYS Place 1 patch onto the skin every 3 (three) days. 09/07/19   [provider]  sertraline (ZOLOFT) 100 MG tablet Take 100 mg by mouth at bedtime. 09/04/19   [provider]   Prenatal care site: [facility]   Social History: She  reports that she has never smoked. She has never used smokeless tobacco. She reports previous alcohol use. She reports that she does not use drugs.  Family History: family history includes Cancer in her mother and another family member.   Review of Systems: A full review of systems was performed and negative except as noted in the HPI.    Physical Exam:  Vital Signs: BP 118/76 (BP Location: Right Arm)   Pulse 99   Temp 98.4 F (36.9 C) (Oral)   Resp 16   Ht 5\' 8"  (1.727 m)   Wt 68 kg Comment: 150lbs  LMP 04/01/2019   BMI 22.81 kg/m   General:   alert, cooperative, appears stated age and pale  Skin:  normal and no rash or abnormalities  Neurologic:    Alert & oriented x 3  Lungs:   clear to auscultation bilaterally  Heart:   regular rate and rhythm, S1, S2 normal, no murmur, click, rub or gallop  Abdomen:  Soft, non-tender, bowel sounds hyperactive, no masses,  no organomegaly, gravid  Pelvis:  Exam deferred.  FHT:  155 BPM  Extremities: : non-tender, symmetric, no edema bilaterally.    No results found for this or any previous visit (from the past 24 hour(s)).  Pertinent Results:  Prenatal Labs: Blood type/Rh A+  Antibody screen neg  Rubella Immune  Varicella Immune  RPR NR  HBsAg Neg  HIV NR  GC neg  Chlamydia neg  Genetic screening MaterniT21 negative, XY; AFP negative  1 hour GTT Too early  3 hour GTT n/a  GBS Too early    US OB Limited  Result Date: 09/02/2019 CLINICAL DATA:  Pelvic pain and vaginal bleeding. Approximately [redacted] weeks pregnant. Beta HCG  53,536. EXAM: LIMITED OBSTETRIC ULTRASOUND AN DOPPLER FINDINGS: Number of Fetuses: 1 Heart Rate:  148 bpm Movement: Visualized Presentation: Breech Placental Location: Posterior Previa: No Amniotic Fluid (Subjective):  Within normal limits. BPD:  3.9cm 17w 6d MATERNAL FINDINGS: Cervix:  Appears closed. Uterus/Adnexae: Normal appearing uterus. Both ovaries have normal appearances. The right ovary measures 3.7 x 2.5 x 2.0 cm with a calculated volume of 9.4 cc. The left ovary measures 3.6 x 2.3 x 1.9 cm with a calculated volume of 8.5 cc. Pulsed Doppler evaluation of both ovaries demonstrates normal low-resistance arterial and venous waveforms. Other findings No free fluid. IMPRESSION: 1. Single live intrauterine gestation in a breech presentation with an estimated gestational age of [redacted] weeks and 6 days with no complicating features. 2. Both ovaries are well visualized and demonstrate normal blood flow with no evidence of ovarian torsion. This exam is performed on an emergent basis and does not comprehensively evaluate fetal size, dating, or anatomy; follow-up complete OB US should be considered if further fetal assessment is warranted. Electronically Signed   By: Claudie Revering M.D.   On: 09/02/2019 20:45    Assessment:  Tiffany Whitehead is a 29 y.o. G110P1011 female at [redacted]w[redacted]d with hyperemesis gravidarum.   Plan:   1. Hyperemesis gravidarum: -Admit for monitoring and hydration -Hydrate with 2L LR: first liter with 100mg  thiamine, 0.6mg  folic acid, and MVI over 1 hour, second liter over 2 hours. After this initial bolus, maintenance fluids of D5%-1/2NS with KCl 37mEq/L at 168mL/hr, titrated to at least 159mL urine/hour -CMP ordered, correct electrolytes as needed -Antiemetics: Zofran 8mg  q8h and Phenergan 25mg  q6h -Glucocorticoids ordered due to the refractory nature of her nausea and vomiting: methylprednisolone 16mg  IV q8h for 48-72h. If no improvement, this can be stopped abruptly without a taper. If  beneficial then an oral prednisone taper: 40mg  daily x1, 20mg  daily x3, 10mg  daily x3, then 5mg  daily x7.  -Per UpToDate, "case reports and small series have reported improvement in symptoms in women with severe disease after treatment of Helicobacter pylori. The [employer] of Obstetricians and Gynecologists suggests considering testing for H. pylori infection in patients who are unresponsive to standard therapy." H. Pylori breath test ordered. -NPO for now with sips with meds. Will advance as tolerated.  -Recheck CMP in the morning.   2. Fetal well-being: -Check FHTs every shift  3. Maternal well-being: -Continue anxiety medications as tolerable. Zoloft 100mg  PO daily and Buspar 5mg  PO BID ordered.    Discussed patient case with Dr. Ouida Sills, who is aware of above plan of care.    Lisette Grinder, CNM 09/23/2019 7:03 PM ----- Lisette Grinder Certified Nurse Midwife [facility], Department of [facility]

## 2019-09-24 DIAGNOSIS — O211 Hyperemesis gravidarum with metabolic disturbance: Secondary | ICD-10-CM | POA: Diagnosis not present

## 2019-09-24 LAB — COMPREHENSIVE METABOLIC PANEL
ALT: 9 U/L (ref 0–44)
AST: 17 U/L (ref 15–41)
Albumin: 3.3 g/dL — ABNORMAL LOW (ref 3.5–5.0)
Alkaline Phosphatase: 43 U/L (ref 38–126)
Anion gap: 6 (ref 5–15)
BUN: 5 mg/dL — ABNORMAL LOW (ref 6–20)
CO2: 23 mmol/L (ref 22–32)
Calcium: 8.6 mg/dL — ABNORMAL LOW (ref 8.9–10.3)
Chloride: 109 mmol/L (ref 98–111)
Creatinine, Ser: 0.35 mg/dL — ABNORMAL LOW (ref 0.44–1.00)
GFR calc Af Amer: >60 mL/min (ref >60)
GFR calc non Af Amer: >60 mL/min (ref >60)
Glucose, Bld: 128 mg/dL — ABNORMAL HIGH (ref 70–99)
Potassium: 4.5 mmol/L (ref 3.5–5.1)
Sodium: 138 mmol/L (ref 135–145)
Total Bilirubin: 0.5 mg/dL (ref 0.3–1.2)
Total Protein: 6.4 g/dL — ABNORMAL LOW (ref 6.5–8.1)

## 2019-09-24 LAB — GLUCOSE, CAPILLARY: Glucose-Capillary: 126 mg/dL — ABNORMAL HIGH (ref 70–99)

## 2019-09-24 MED ORDER — PROMETHAZINE HCL 25 MG PO TABS
25.0000 mg | ORAL_TABLET | Freq: Four times a day (QID) | ORAL | 0 refills | Status: DC
Start: 2019-09-24 — End: 2020-01-28

## 2019-09-24 MED ORDER — BISACODYL 10 MG RE SUPP
10.0000 mg | Freq: Every day | RECTAL | Status: DC | PRN
  Filled 2019-09-24: qty 1

## 2019-09-24 MED ORDER — PREDNISONE 5 MG/5ML PO SOLN
ORAL | 0 refills | Status: AC
Start: 2019-09-24 — End: 2019-10-08

## 2019-09-24 MED ORDER — ONDANSETRON 8 MG PO TBDP: 8.0000 mg | ORAL_TABLET | Freq: Three times a day (TID) | ORAL | 1 refills | Status: DC

## 2019-09-24 MED ORDER — PREDNISONE 5 MG/5ML PO SOLN
ORAL | 0 refills | Status: DC
Start: 2019-09-24 — End: 2019-09-24

## 2019-09-24 MED ORDER — SIMETHICONE 40 MG/0.6ML PO SUSP
250.0000 mg | Freq: Two times a day (BID) | ORAL | Status: DC | PRN
  Filled 2019-09-24: qty 15

## 2019-09-24 MED ORDER — METOCLOPRAMIDE HCL 5 MG/5ML PO SOLN
10.0000 mg | Freq: Three times a day (TID) | ORAL | 3 refills | Status: DC
Start: 2019-09-24 — End: 2019-11-21

## 2019-09-24 MED ORDER — LACTATED RINGERS IV SOLN: INTRAVENOUS | Status: DC

## 2019-09-24 MED ORDER — METOCLOPRAMIDE HCL 5 MG/5ML PO SOLN
10.0000 mg | Freq: Three times a day (TID) | ORAL | Status: DC
  Filled 2019-09-24: qty 10

## 2019-09-24 MED ORDER — METOCLOPRAMIDE HCL 5 MG/5ML PO SOLN
10.0000 mg | Freq: Three times a day (TID) | ORAL | 3 refills | Status: DC
Start: 2019-09-24 — End: 2019-09-24

## 2019-09-24 MED ORDER — PROMETHAZINE HCL 25 MG PO TABS
25.0000 mg | ORAL_TABLET | Freq: Four times a day (QID) | ORAL | 0 refills | Status: DC
Start: 2019-09-24 — End: 2019-09-24

## 2019-09-24 MED ORDER — METOCLOPRAMIDE HCL 5 MG/ML IJ SOLN
10.0000 mg | Freq: Three times a day (TID) | INTRAMUSCULAR | Status: DC
  Administered 2019-09-24: 11:00:00 10 mg via INTRAVENOUS
  Filled 2019-09-24: qty 2

## 2019-09-24 NOTE — Progress Notes (Signed)
Clear fluids and next dose of meds can be PO if able to tolerate per provider.

## 2019-09-24 NOTE — Discharge Summary (Signed)
Patient ID: Tiffany Whitehead MRN: [id number] DOB/AGE: 15-Jan-1991 29 y.o.  Admit date: 09/23/2019 Discharge date: 09/24/2019  Admission Diagnoses: Hyperemesis gravidarum, dehydration  Discharge Diagnoses: Hyperemesis gravidarum  Prenatal Procedures: none  Consults: none  Significant Diagnostic Studies:  Results for orders placed or performed during the hospital encounter of 09/23/19 (from the past 168 hour(s))  Comprehensive metabolic panel   Collection Time: 09/23/19  6:36 PM  Result Value Ref Range   Sodium 135 135 - 145 mmol/L   Potassium 3.7 3.5 - 5.1 mmol/L   Chloride 106 98 - 111 mmol/L   CO2 21 (L) 22 - 32 mmol/L   Glucose, Bld 95 70 - 99 mg/dL   BUN 12 6 - 20 mg/dL   Creatinine, Ser 0.44 0.44 - 1.00 mg/dL   Calcium 8.9 8.9 - 10.3 mg/dL   Total Protein 6.3 (L) 6.5 - 8.1 g/dL   Albumin 3.4 (L) 3.5 - 5.0 g/dL   AST 14 (L) 15 - 41 U/L   ALT 9 0 - 44 U/L   Alkaline Phosphatase 49 38 - 126 U/L   Total Bilirubin 0.5 0.3 - 1.2 mg/dL   GFR calc non Af Amer >60 >60 mL/min   GFR calc Af Amer >60 >60 mL/min   Anion gap 8 5 - 15  Urinalysis, Complete w Microscopic   Collection Time: 09/23/19  7:47 PM  Result Value Ref Range   Color, Urine YELLOW (A) YELLOW   APPearance HAZY (A) CLEAR   Specific Gravity, Urine 1.006 1.005 - 1.030   pH 6.0 5.0 - 8.0   Glucose, UA NEGATIVE NEGATIVE mg/dL   Hgb urine dipstick NEGATIVE NEGATIVE   Bilirubin Urine NEGATIVE NEGATIVE   Ketones, ur NEGATIVE NEGATIVE mg/dL   Protein, ur NEGATIVE NEGATIVE mg/dL   Nitrite NEGATIVE NEGATIVE   Leukocytes,Ua SMALL (A) NEGATIVE   WBC, UA 0-5 0 - 5 WBC/hpf   Bacteria, UA RARE (A) NONE SEEN   Squamous Epithelial / LPF 0-5 0 - 5   Mucus PRESENT   Glucose, capillary   Collection Time: 09/24/19  5:59 AM  Result Value Ref Range   Glucose-Capillary 126 (H) 70 - 99 mg/dL  Comprehensive metabolic panel   Collection Time: 09/24/19  6:22 AM  Result Value Ref Range   Sodium 138 135 - 145 mmol/L   Potassium 4.5  3.5 - 5.1 mmol/L   Chloride 109 98 - 111 mmol/L   CO2 23 22 - 32 mmol/L   Glucose, Bld 128 (H) 70 - 99 mg/dL   BUN 5 (L) 6 - 20 mg/dL   Creatinine, Ser 0.35 (L) 0.44 - 1.00 mg/dL   Calcium 8.6 (L) 8.9 - 10.3 mg/dL   Total Protein 6.4 (L) 6.5 - 8.1 g/dL   Albumin 3.3 (L) 3.5 - 5.0 g/dL   AST 17 15 - 41 U/L   ALT 9 0 - 44 U/L   Alkaline Phosphatase 43 38 - 126 U/L   Total Bilirubin 0.5 0.3 - 1.2 mg/dL   GFR calc non Af Amer >60 >60 mL/min   GFR calc Af Amer >60 >60 mL/min   Anion gap 6 5 - 15    Treatments: IV hydration, IV antiemetics, IV glucocorticoids  Hospital Course:  This is a 29 y.o. G3P1011 with IUP at [redacted]w[redacted]d admitted for unrelenting nausea and vomiting with dehydration in the setting of hyperemesis gravidarum. She reports positive fetal movement, no vaginal bleeding, and no cramping. FHT reassuring in the 150s. She was hydrated with 2L bolus  IVF, followed by maintenance fluids. She was treated with scheduled antiemetics. Glucocorticoids were started as well. Labs stable with no significant electrolyte imbalance. She was able to tolerate clears, regular diet, and oral medications prior to discharge. She was deemed stable for discharge to home with outpatient follow up.   Discharge Physical Exam:  BP 121/65   Pulse 86   Temp 98.2 F (36.8 C) (Oral)   Resp 12   Ht 5\' 8"  (1.727 m)   Wt 68 kg Comment: 150lbs  LMP 04/01/2019   BMI 22.81 kg/m   General: NAD CV: RRR Pulm: CTABL, nl effort ABD: s/nd/nt, gravid DVT Evaluation: LE non-ttp, no evidence of DVT on exam.  Doppler: FHT 147bpm, distinguished from maternal pulse   Discharge Condition: Stable  Disposition:  Discharge disposition: 01-Home or Self Care        Allergies as of 09/24/2019      Reactions   Ceclor [cefaclor] Rash      Medication List    STOP taking these medications   LORazepam 0.5 MG tablet Commonly known as: ATIVAN     TAKE these medications   acetaminophen 325 MG tablet Commonly  known as: TYLENOL Take 2 tablets (650 mg total) by mouth every 6 (six) hours as needed for mild pain or moderate pain.   busPIRone 5 MG tablet Commonly known as: BUSPAR Take 5 mg by mouth 2 (two) times daily.   metoCLOPramide 5 MG/5ML solution Commonly known as: REGLAN Take 10 mLs (10 mg total) by mouth every 8 (eight) hours.   multivitamin-prenatal 27-0.8 MG Tabs tablet Take 1 tablet by mouth daily.   ondansetron 8 MG disintegrating tablet Commonly known as: ZOFRAN-ODT Take 1 tablet (8 mg total) by mouth every 8 (eight) hours. What changed:   medication strength  how much to take  when to take this  reasons to take this   predniSONE 5 MG/5ML solution Take 40 mLs (40 mg total) by mouth daily with breakfast for 1 day, THEN 20 mLs (20 mg total) daily with breakfast for 3 days, THEN 10 mLs (10 mg total) daily with breakfast for 3 days, THEN 5 mLs (5 mg total) daily with breakfast for 7 days. Start taking on: Sep 24, 2019   promethazine 25 MG suppository Commonly known as: PHENERGAN Place 25 mg rectally every 6 (six) hours as needed. What changed: Another medication with the same name was added. Make sure you understand how and when to take each.   promethazine 25 MG tablet Commonly known as: PHENERGAN Take 1 tablet (25 mg total) by mouth every 6 (six) hours. What changed: You were already taking a medication with the same name, and this prescription was added. Make sure you understand how and when to take each.   scopolamine 1 MG/3DAYS Commonly known as: TRANSDERM-SCOP Place 1 patch onto the skin every 3 (three) days.   sertraline 100 MG tablet Commonly known as: ZOLOFT Take 100 mg by mouth at bedtime.        Signed:  Sep 26, 2019 09/24/2019 3:50 PM ----- 11/24/2019, CNM Certified Nurse Midwife [facility]

## 2019-09-24 NOTE — Discharge Instructions (Signed)
Take anti-nausea medications on a schedule (not just if you feel nauseous): -Zofran oral disintegrating tablet, 8mg  every 8 hours [6am, 2pm, 10pm or similar spacing] -Phnergan tablet, 25mg  every 6 hours while awake [8am, 2pm, 8pm or similar spacing] -Reglan liquid, 10mg  (16mL) every 8 hours [6am, 2pm, 10pm or similar spacing]  Take prednisone taper as prescribed: -40mg  (67mL) on day 1 -20mg  (30mL) on days 2-4 -10mg  (55mL) on days 5-7 -5mg  (6mL) on days 8-14  Eat at least a small amount of food (something like crackers or toast) every 2-3 hours. Stay hydrated!! The best way to do this is to drink small amounts frequently throughout the day. This can be whatever sounds best, but try to drink something with some calories or electrolytes such as Gatorade or broths.   If you go 12-24 hours of not keeping anything down, please call the office and we will probably have you come in for IV fluids. If you have any questions, please reach out.

## 2019-09-24 NOTE — Progress Notes (Signed)
ANTEPARTUM PROGRESS NOTE  Tiffany Whitehead is a 29 y.o. G3P1011 at [redacted]w[redacted]d who is admitted for hyperemeis.   Estimated Date of Delivery: 02/09/20  Length of Stay:  0 Days. Admitted 09/23/2019  Subjective: Tiffany Whitehead is feeling much better. She still has small amounts of nausea, but no vomiting overnight. She feels like she got better sleep than she has this whole pregnancy.    Vitals:  BP 121/65   Pulse 86   Temp 98.2 F (36.8 C) (Oral)   Resp 12   Ht 5\' 8"  (1.727 m)   Wt 68 kg Comment: 150lbs  LMP 04/01/2019   BMI 22.81 kg/m  Physical Examination: General:   alert, cooperative and appears stated age  Skin:  normal and no rash or abnormalities  Neurologic:    Alert & oriented x 3  Lungs:   normal respiratory effort  Pelvis:  Exam deferred.  Extremities: : non-tender, symmetric, no edema bilaterally.      Results for orders placed or performed during the hospital encounter of 09/23/19 (from the past 48 hour(s))  Comprehensive metabolic panel     Status: Abnormal   Collection Time: 09/23/19  6:36 PM  Result Value Ref Range   Sodium 135 135 - 145 mmol/L   Potassium 3.7 3.5 - 5.1 mmol/L   Chloride 106 98 - 111 mmol/L   CO2 21 (L) 22 - 32 mmol/L   Glucose, Bld 95 70 - 99 mg/dL    Comment: Glucose reference range applies only to samples taken after fasting for at least 8 hours.   BUN 12 6 - 20 mg/dL   Creatinine, Ser 0.44 0.44 - 1.00 mg/dL   Calcium 8.9 8.9 - 10.3 mg/dL   Total Protein 6.3 (L) 6.5 - 8.1 g/dL   Albumin 3.4 (L) 3.5 - 5.0 g/dL   AST 14 (L) 15 - 41 U/L   ALT 9 0 - 44 U/L   Alkaline Phosphatase 49 38 - 126 U/L   Total Bilirubin 0.5 0.3 - 1.2 mg/dL   GFR calc non Af Amer >60 >60 mL/min   GFR calc Af Amer >60 >60 mL/min   Anion gap 8 5 - 15    Comment: Performed at [facility], [address] [place], [place] [address]  Urinalysis, Complete w Microscopic     Status: Abnormal   Collection Time: 09/23/19  7:47 PM  Result Value Ref Range   Color, Urine YELLOW  (A) YELLOW   APPearance HAZY (A) CLEAR   Specific Gravity, Urine 1.006 1.005 - 1.030   pH 6.0 5.0 - 8.0   Glucose, UA NEGATIVE NEGATIVE mg/dL   Hgb urine dipstick NEGATIVE NEGATIVE   Bilirubin Urine NEGATIVE NEGATIVE   Ketones, ur NEGATIVE NEGATIVE mg/dL   Protein, ur NEGATIVE NEGATIVE mg/dL   Nitrite NEGATIVE NEGATIVE   Leukocytes,Ua SMALL (A) NEGATIVE   WBC, UA 0-5 0 - 5 WBC/hpf   Bacteria, UA RARE (A) NONE SEEN   Squamous Epithelial / LPF 0-5 0 - 5   Mucus PRESENT     Comment: Performed at [facility], [address] [place], [place] [address]  Glucose, capillary     Status: Abnormal   Collection Time: 09/24/19  5:59 AM  Result Value Ref Range   Glucose-Capillary 126 (H) 70 - 99 mg/dL    Comment: Glucose reference range applies only to samples taken after fasting for at least 8 hours.  Comprehensive metabolic panel     Status: Abnormal   Collection  Time: 09/24/19  6:22 AM  Result Value Ref Range   Sodium 138 135 - 145 mmol/L   Potassium 4.5 3.5 - 5.1 mmol/L   Chloride 109 98 - 111 mmol/L   CO2 23 22 - 32 mmol/L   Glucose, Bld 128 (H) 70 - 99 mg/dL    Comment: Glucose reference range applies only to samples taken after fasting for at least 8 hours.   BUN 5 (L) 6 - 20 mg/dL   Creatinine, Ser 2.95 (L) 0.44 - 1.00 mg/dL   Calcium 8.6 (L) 8.9 - 10.3 mg/dL   Total Protein 6.4 (L) 6.5 - 8.1 g/dL   Albumin 3.3 (L) 3.5 - 5.0 g/dL   AST 17 15 - 41 U/L   ALT 9 0 - 44 U/L   Alkaline Phosphatase 43 38 - 126 U/L   Total Bilirubin 0.5 0.3 - 1.2 mg/dL   GFR calc non Af Amer >60 >60 mL/min   GFR calc Af Amer >60 >60 mL/min   Anion gap 6 5 - 15    Comment: Performed at [facility], [address] [place], [place] [address]    No results found.  Current scheduled medications . busPIRone  5 mg Oral BID  . methylPREDNISolone (SOLU-MEDROL) injection  16 mg Intravenous Q8H  . ondansetron  8 mg Oral Q8H  . prenatal multivitamin  1 tablet Oral Q1200  .  promethazine  25 mg Intravenous Q6H   Or  . promethazine  25 mg Oral Q6H   Or  . promethazine  25 mg Rectal Q6H  . sertraline  100 mg Oral Daily    I have reviewed the patient's current medications.  ASSESSMENT: Patient Active Problem List   Diagnosis Date Noted  . Hyperemesis affecting pregnancy, antepartum 09/23/2019  . Nausea and vomiting during pregnancy 09/14/2019  . Supervision of normal pregnancy 07/10/2019  . Anxiety 11/22/2017  . GAD (generalized anxiety disorder) 07/04/2015  . ADHD (attention deficit hyperactivity disorder), combined type 07/04/2015  . Family history of colon cancer 07/04/2015  . Lactose intolerance 07/04/2015  . 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 Hyperemesis gravidarum: -Switch IV fluids from D5%-1/2NS with KCl 71mEq/L to LRat 13mL/hr, titrated to at least urine/hour. Patient with mildly elevated blood glucose this morning, likely secondary to steroids, and increase in serum potassium, though still within normal.  -CMP stable this morning.  -Continue antiemetics: Zofran 8mg  q8h and Phenergan 25mg  q6h -Continue glucocorticoids: methylprednisolone 16mg  IV q8h for 48-72h. If beneficial then an oral prednisone taper: 40mg  daily x1, 20mg  daily x3, 10mg  daily x3, then 5mg  daily x7.  -Per UpToDate, "case reports and small series have reported improvement in symptoms in women with severe disease after treatment of Helicobacter pylori. The of Obstetricians and Gynecologists suggests considering testing for H. pylori infection in patients who are unresponsive to standard therapy." H. Pylori breath test collected, result pending. -Advance to clears this morning. If tolerated, advance to regular diet. If that is tolerated, possible discharge home later today. 1  2. Fetal well-being: -Check FHTs every shift  3. Maternal well-being: -Continue anxiety medications as tolerable. Zoloft 100mg  PO daily and Buspar 5mg  PO BID  ordered.   Continue routine antenatal care.  , 09/24/2019 8:58 AM  Certified Nurse Midwife [facility]

## 2019-09-25 LAB — H. PYLORI BREATH TEST: H. pylori UBiT: NEGATIVE

## 2019-10-21 ENCOUNTER — Emergency Department
Admission: EM | Admit: 2019-10-21 | Discharge: 2019-10-21 | Discharge status: Against Medical Advice | Payer: BC Managed Care – PPO | Source: Home / Self Care

## 2019-10-21 ENCOUNTER — Other Ambulatory Visit: Payer: Self-pay

## 2019-10-21 ENCOUNTER — Encounter: Payer: Self-pay | Admitting: Obstetrics and Gynecology

## 2019-10-21 ENCOUNTER — Observation Stay
Admission: EM | Admit: 2019-10-21 | Discharge: 2019-10-21 | Disposition: A | Discharge status: Home / Self Care | Payer: BC Managed Care – PPO | Attending: Certified Nurse Midwife | Admitting: Certified Nurse Midwife

## 2019-10-21 DIAGNOSIS — Z3A24 24 weeks gestation of pregnancy: Secondary | ICD-10-CM | POA: Insufficient documentation

## 2019-10-21 DIAGNOSIS — O9A212 Injury, poisoning and certain other consequences of external causes complicating pregnancy, second trimester: Principal | ICD-10-CM | POA: Insufficient documentation

## 2019-10-21 DIAGNOSIS — W19XXXA Unspecified fall, initial encounter: Secondary | ICD-10-CM | POA: Diagnosis present

## 2019-10-21 HISTORY — DX: Chronic kidney disease, unspecified: N18.9

## 2019-10-21 MED ORDER — ACETAMINOPHEN 500 MG PO TABS
1000.0000 mg | ORAL_TABLET | Freq: Four times a day (QID) | ORAL | Status: DC | PRN
  Administered 2019-10-21: 1000 mg via ORAL
  Filled 2019-10-21: qty 2

## 2019-10-21 NOTE — Discharge Summary (Signed)
Tiffany Whitehead is a 29 y.o. female. She is at [redacted]w[redacted]d gestation. Patient's last menstrual period was 04/01/2019. Estimated Date of Delivery: 02/09/20  Prenatal care site: [facility]   Chief complaint: fall Location: whole body Onset/timing: today around 2000 Duration: once Quality: n/a Severity: moderate Aggravating or alleviating conditions: n/a Associated signs/symptoms: face pain, side/rib pain, back pain Context: Tiffany Whitehead arrived to L&D triage via the ED via EMS. She reports that she tripped over a box in her home and fell on her face and side. She doesn't think she passed out, but reports that everything went black and she couldn't immediately remember the details of what happened. She reports that she urinated at the time of the fall. She reports face, right side/rib, and back pain. She has not noticed fetal movement since the incident, but she has been crying and shaken up and thinks it would be hard to notice given her gestational age. She reports no abdominal pain, no cramping or contractions, and no vaginal bleeding or leakage of fluid.   S: Resting comfortably.   She reports:  -no leakage of fluid -no vaginal bleeding -no contractions/cramping  Maternal Medical History:   Past Medical History:  Diagnosis Date  . Anxiety   . Chronic kidney disease   . Depression   . H/O borderline personality disorder    stopped meds at beginning of pregnancy, Lamictal and Vyvanse  . UTI (urinary tract infection)     Past Surgical History:  Procedure Laterality Date  . APPENDECTOMY    . COLONOSCOPY    . extraction of wisdom teeth      Allergies  Allergen Reactions  . Ceclor [Cefaclor] Rash    Prior to Admission medications   Medication Sig Start Date End Date Taking? Authorizing Provider  acetaminophen (TYLENOL) 325 MG tablet Take 2 tablets (650 mg total) by mouth every 6 (six) hours as needed for mild pain or moderate pain. 11/24/17  Yes [name], CNM   busPIRone (BUSPAR) 5 MG tablet Take 5 mg by mouth 2 (two) times daily. 09/04/19  Yes [provider]  metoCLOPramide (REGLAN) 5 MG/5ML solution Take 10 mLs (10 mg total) by mouth every 8 (eight) hours. 09/24/19  Yes [name], CNM  ondansetron (ZOFRAN-ODT) 8 MG disintegrating tablet Take 1 tablet (8 mg total) by mouth every 8 (eight) hours. 09/24/19  Yes [name], CNM  Prenatal Vit-Fe Fumarate-FA (MULTIVITAMIN-PRENATAL) 27-0.8 MG TABS tablet Take 1 tablet by mouth daily.   Yes [provider]  promethazine (PHENERGAN) 25 MG tablet Take 1 tablet (25 mg total) by mouth every 6 (six) hours. 09/24/19  Yes [name], CNM  sertraline (ZOLOFT) 100 MG tablet Take 100 mg by mouth at bedtime. 09/04/19  Yes [provider]  ondansetron (ZOFRAN-ODT) 8 MG disintegrating tablet Take 1 tablet (8 mg total) by mouth every 8 (eight) hours. 09/24/19   [name], CNM  promethazine (PHENERGAN) 25 MG suppository Place 25 mg rectally every 6 (six) hours as needed. 07/27/19   [provider]  scopolamine (TRANSDERM-SCOP) 1 MG/3DAYS Place 1 patch onto the skin every 3 (three) days. 09/07/19   [provider]     Social History: She  reports that she has never smoked. She has never used smokeless tobacco. She reports previous alcohol use. She reports that she does not use drugs.  Family History: family history includes Cancer in her mother and another family member.   Review of Systems: A full review of systems was  performed and negative except as noted in the HPI.     O:  BP 117/66 (BP Location: Left Arm)   Pulse 84   Temp 98.4 F (36.9 C) (Oral)   Ht 5\' 8"  (1.727 m)   Wt 70.3 kg   LMP 04/01/2019   BMI 23.57 kg/m  No results found for this or any previous visit (from the past 48 hour(s)).   Constitutional: mild distress, AAOx3  HE/ENT: extraocular movements grossly intact, moist mucous membranes, bruising along right cheek CV: RRR PULM:  normal respiratory effort, CTABL Back: right mid back and side tender to palpation, right rib cage tender to palpation Abd: gravid, non-tender, non-distended, soft      Ext: Non-tender, Nonedmeatous   Psych: mood appropriate, speech normal Pelvic deferred  Bedside ultrasound with: subjectively normal AFI, normal fetal heart rate, fetal movement, and posterior placenta  FHT: 145-150bpm  Monitoring: 1 hour 15 minutes Toco: quiet   A/P: 29 y.o. [redacted]w[redacted]d here for antenatal surveillance during pregnancy.  Principle diagnosis: s/p fall  Preterm Labor  Not present  No uterine activity on toco and no reports of cramping or abdominal pain  Fetal Wellbeing  Normal FHT  No bleeding or loss of fluid  Bedside ultrasound reassuring  Fall  Cleared from [facility] perspective  Do recommend ED evaluation for possible concussion and rib injury. Patient and her partner in agreement with this plan.   D/c home stable, precautions reviewed, follow-up as scheduled.   Has appointment in the office on 10/23/2019   Lisette Grinder 10/21/2019 9:56 PM  ----- Lisette Grinder, CNM Certified Nurse Midwife [facility], [place], Department of [facility]

## 2019-10-21 NOTE — Progress Notes (Signed)
Pt given discharge instructions along with follow up care and labor precautions. Pt discharged from Labor and Delivery and taken to ED via wheelchair with SO to be evaluated by the ED

## 2019-10-21 NOTE — OB Triage Note (Signed)
Pt G3P1 [redacted]w[redacted]d presents to birthplace through ED via EMS. States she fell earlier this evening around 2000, wasn't feeling baby move since fall. Complains of rib pain. Reports no vaginal bleeding, no LOF. Monitors on and assessing, FHT 145 at 2022.

## 2019-10-22 ENCOUNTER — Encounter (HOSPITAL_COMMUNITY): Payer: Self-pay | Admitting: Obstetrics and Gynecology

## 2019-10-22 ENCOUNTER — Observation Stay (HOSPITAL_COMMUNITY)
Admission: AD | Admit: 2019-10-22 | Discharge: 2019-10-24 | Disposition: A | Discharge status: Home / Self Care | Payer: BC Managed Care – PPO | Attending: Obstetrics and Gynecology | Admitting: Obstetrics and Gynecology

## 2019-10-22 ENCOUNTER — Inpatient Hospital Stay (HOSPITAL_BASED_OUTPATIENT_CLINIC_OR_DEPARTMENT_OTHER): Payer: BC Managed Care – PPO

## 2019-10-22 DIAGNOSIS — Z3A24 24 weeks gestation of pregnancy: Secondary | ICD-10-CM

## 2019-10-22 DIAGNOSIS — O321XX0 Maternal care for breech presentation, not applicable or unspecified: Secondary | ICD-10-CM | POA: Insufficient documentation

## 2019-10-22 DIAGNOSIS — E739 Lactose intolerance, unspecified: Secondary | ICD-10-CM | POA: Diagnosis not present

## 2019-10-22 DIAGNOSIS — O99342 Other mental disorders complicating pregnancy, second trimester: Secondary | ICD-10-CM | POA: Insufficient documentation

## 2019-10-22 DIAGNOSIS — O4692 Antepartum hemorrhage, unspecified, second trimester: Secondary | ICD-10-CM

## 2019-10-22 DIAGNOSIS — O9A212 Injury, poisoning and certain other consequences of external causes complicating pregnancy, second trimester: Secondary | ICD-10-CM

## 2019-10-22 DIAGNOSIS — Z20822 Contact with and (suspected) exposure to covid-19: Secondary | ICD-10-CM | POA: Insufficient documentation

## 2019-10-22 DIAGNOSIS — Y9389 Activity, other specified: Secondary | ICD-10-CM | POA: Diagnosis not present

## 2019-10-22 DIAGNOSIS — F603 Borderline personality disorder: Secondary | ICD-10-CM | POA: Insufficient documentation

## 2019-10-22 DIAGNOSIS — W010XXA Fall on same level from slipping, tripping and stumbling without subsequent striking against object, initial encounter: Secondary | ICD-10-CM

## 2019-10-22 DIAGNOSIS — S3991XA Unspecified injury of abdomen, initial encounter: Secondary | ICD-10-CM

## 2019-10-22 DIAGNOSIS — O26892 Other specified pregnancy related conditions, second trimester: Principal | ICD-10-CM | POA: Insufficient documentation

## 2019-10-22 DIAGNOSIS — N189 Chronic kidney disease, unspecified: Secondary | ICD-10-CM | POA: Insufficient documentation

## 2019-10-22 DIAGNOSIS — Y92009 Unspecified place in unspecified non-institutional (private) residence as the place of occurrence of the external cause: Secondary | ICD-10-CM

## 2019-10-22 DIAGNOSIS — O26832 Pregnancy related renal disease, second trimester: Secondary | ICD-10-CM | POA: Diagnosis not present

## 2019-10-22 DIAGNOSIS — Y999 Unspecified external cause status: Secondary | ICD-10-CM

## 2019-10-22 DIAGNOSIS — Z800 Family history of malignant neoplasm of digestive organs: Secondary | ICD-10-CM | POA: Diagnosis not present

## 2019-10-22 DIAGNOSIS — Y939 Activity, unspecified: Secondary | ICD-10-CM

## 2019-10-22 DIAGNOSIS — F411 Generalized anxiety disorder: Secondary | ICD-10-CM | POA: Insufficient documentation

## 2019-10-22 DIAGNOSIS — Z881 Allergy status to other antibiotic agents status: Secondary | ICD-10-CM | POA: Insufficient documentation

## 2019-10-22 DIAGNOSIS — O210 Mild hyperemesis gravidarum: Secondary | ICD-10-CM | POA: Insufficient documentation

## 2019-10-22 DIAGNOSIS — Z79899 Other long term (current) drug therapy: Secondary | ICD-10-CM | POA: Insufficient documentation

## 2019-10-22 DIAGNOSIS — F329 Major depressive disorder, single episode, unspecified: Secondary | ICD-10-CM | POA: Diagnosis not present

## 2019-10-22 DIAGNOSIS — S3981XA Other specified injuries of abdomen, initial encounter: Secondary | ICD-10-CM | POA: Insufficient documentation

## 2019-10-22 DIAGNOSIS — Z87440 Personal history of urinary (tract) infections: Secondary | ICD-10-CM | POA: Insufficient documentation

## 2019-10-22 DIAGNOSIS — F909 Attention-deficit hyperactivity disorder, unspecified type: Secondary | ICD-10-CM | POA: Diagnosis not present

## 2019-10-22 LAB — CBC
HCT: 30.1 % — ABNORMAL LOW (ref 36.0–46.0)
Hemoglobin: 10.2 g/dL — ABNORMAL LOW (ref 12.0–15.0)
MCH: 31.6 pg (ref 26.0–34.0)
MCHC: 33.9 g/dL (ref 30.0–36.0)
MCV: 93.2 fL (ref 80.0–100.0)
Platelets: 203 10*3/uL (ref 150–400)
RBC: 3.23 MIL/uL — ABNORMAL LOW (ref 3.87–5.11)
RDW: 13.5 % (ref 11.5–15.5)
WBC: 11.9 10*3/uL — ABNORMAL HIGH (ref 4.0–10.5)
nRBC: 0 % (ref 0.0–0.2)

## 2019-10-22 LAB — TYPE AND SCREEN
ABO/RH(D): A POS
Antibody Screen: NEGATIVE

## 2019-10-22 LAB — SARS CORONAVIRUS 2 BY RT PCR (HOSPITAL ORDER, PERFORMED IN ~~LOC~~ HOSPITAL LAB): SARS Coronavirus 2: NEGATIVE

## 2019-10-22 LAB — ABO/RH: ABO/RH(D): A POS

## 2019-10-22 IMAGING — US US MFM OB LIMITED
1 series · 14 of 28 positions shown · non-contrast
Comparison: none

[Series 1: us mfm ob limited · 14 of 64 slices shown]
[im 3/64]
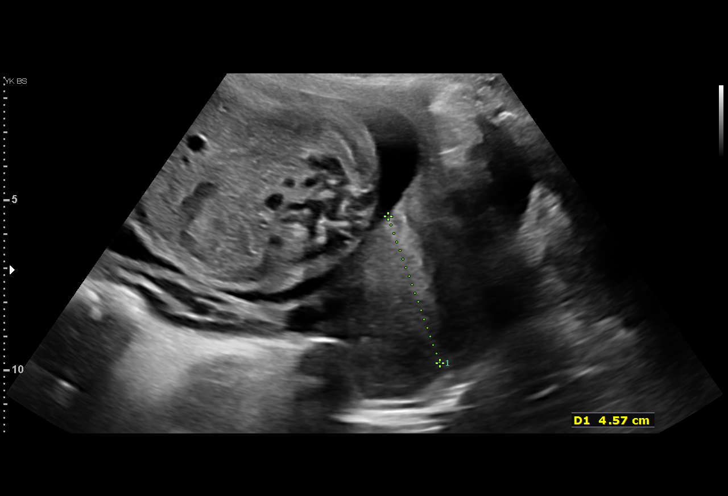
[im 8/64]
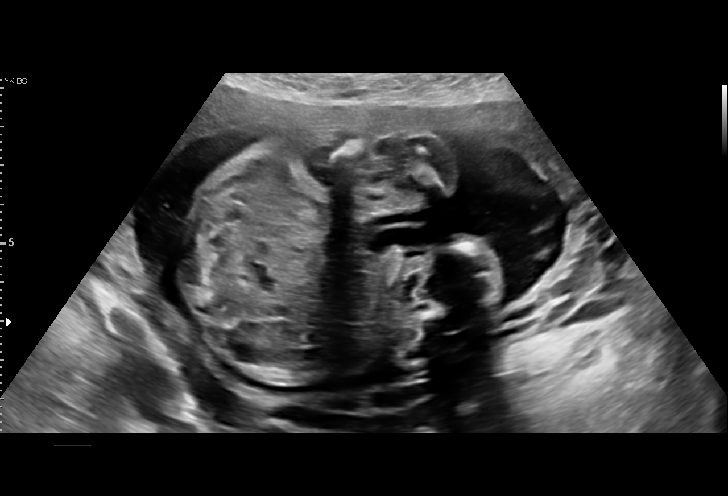
[im 12/64]
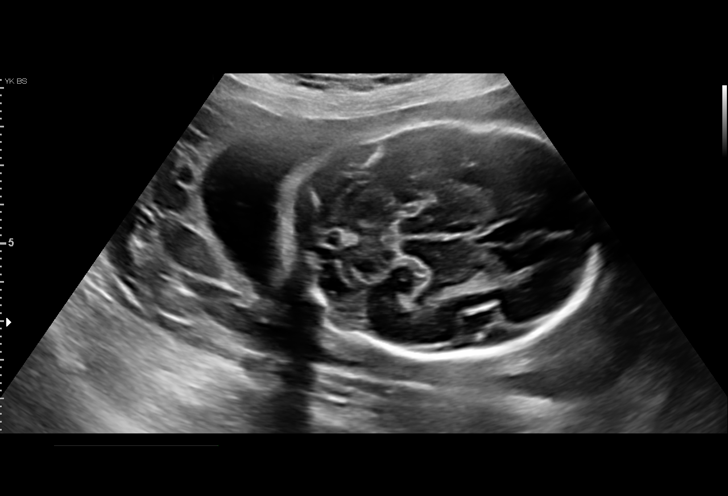
[im 17/64]
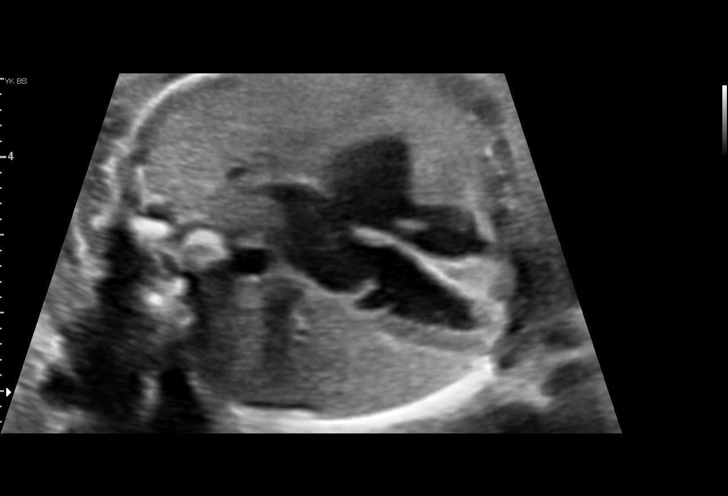
[im 22/64]
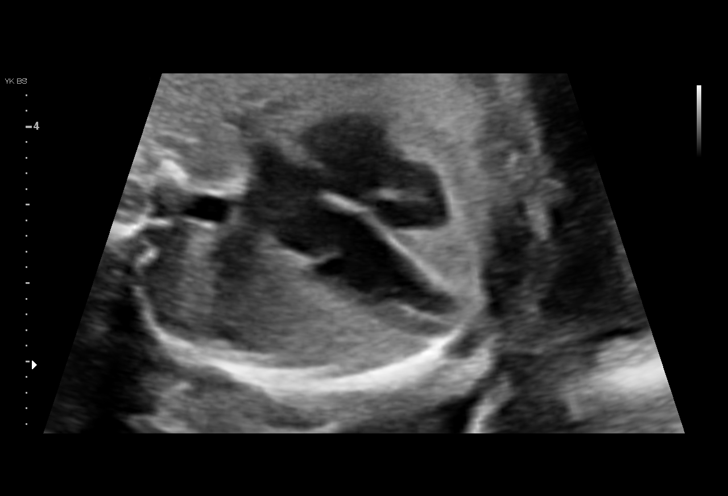
[im 26/64]
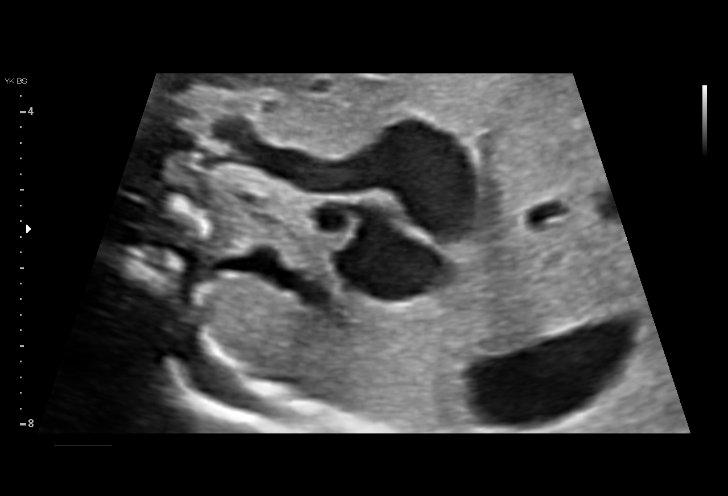
[im 31/64]
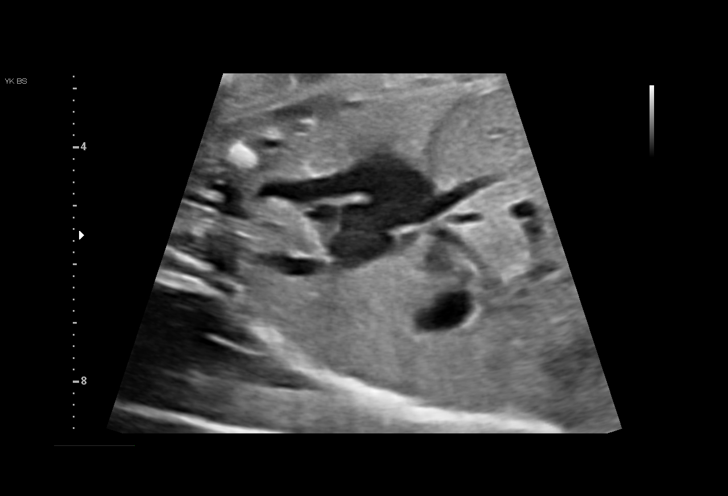
[im 36/64]
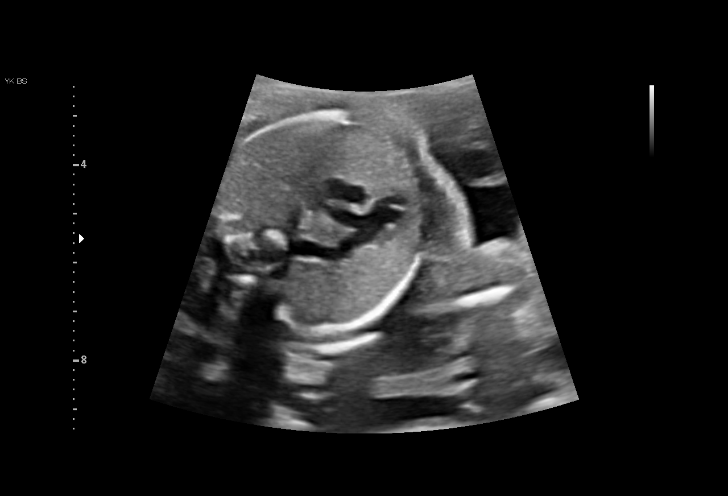
[im 40/64]
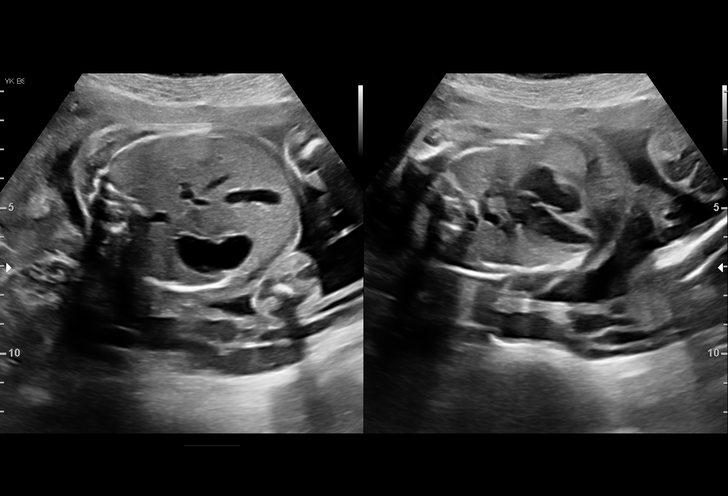
[im 45/64]
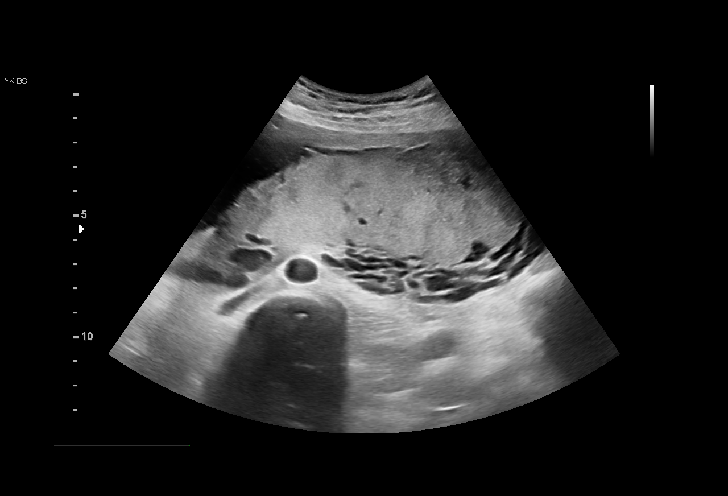
[im 50/64]
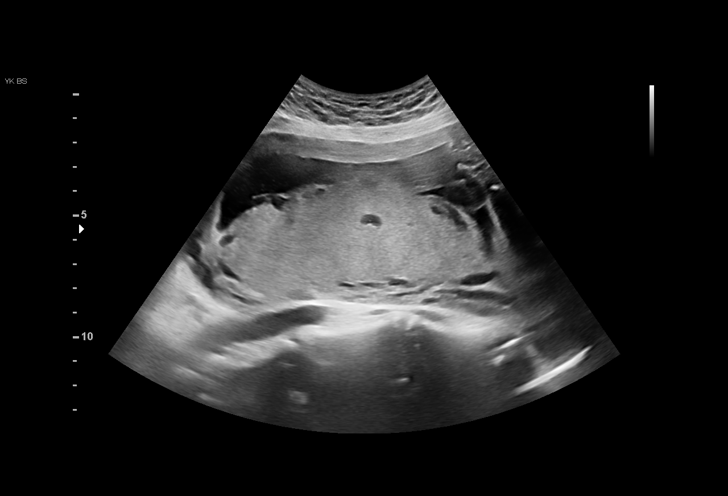
[im 54/64]
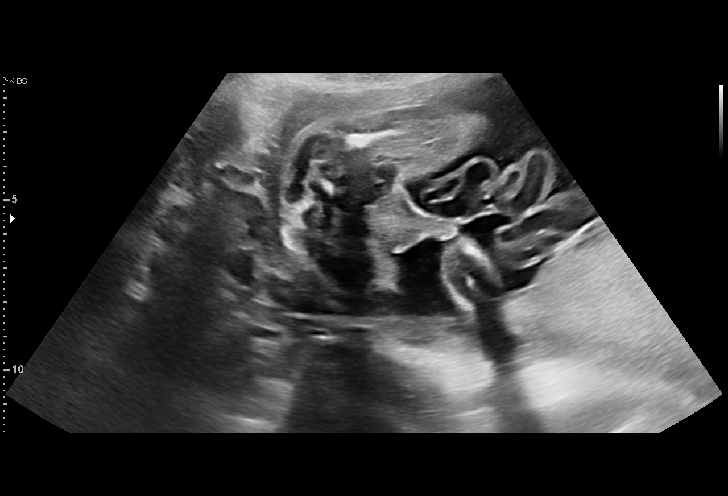
[im 59/64]
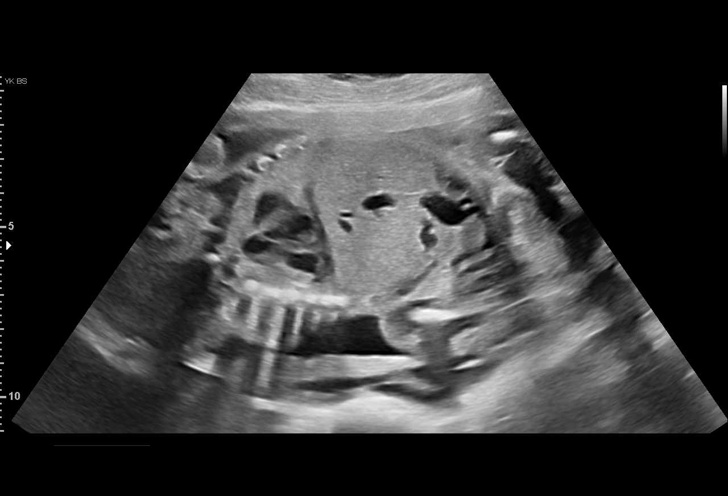
[im 64/64]
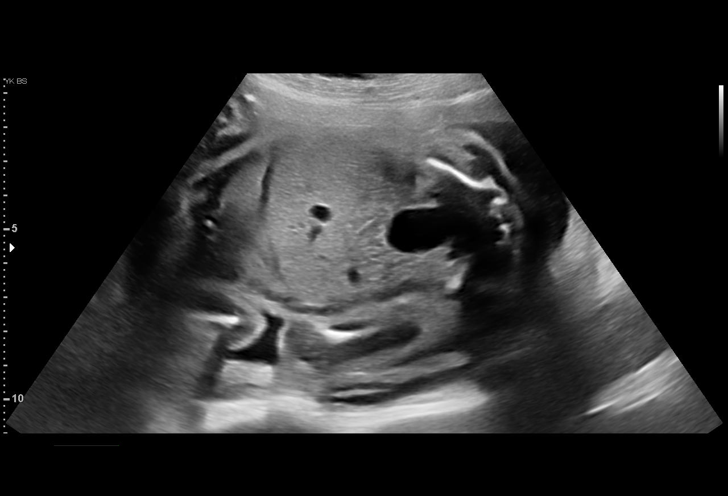

[14 of 28 positions shown; findings below may reference images not displayed]

PELCASTRE CNM
                                                             [REDACTED]. [HOSPITAL],
 Referred By:      [facility] MAU/Triage         [facility]          [facility]

 1  US MFM OB LIMITED                     76815.01    PELCASTRE

Indications

 Vaginal bleeding in pregnancy, second           [NN]
 trimester
 Traumatic injury during pregnancy (fall         O9A.219 [NN]
 yesterday)
 24 weeks gestation of pregnancy
Fetal Evaluation

 Num Of Fetuses:          1
 Fetal Heart Rate(bpm):   142
 Cardiac Activity:        Observed
 Presentation:            Breech
 Placenta:                Posterior
 P. Cord Insertion:       Visualized, central

 Amniotic Fluid
 AFI FV:      Within normal limits

                             Largest Pocket(cm)


 Comment:    No placental abruption or previa identified.
OB History

 Gravidity:    3         Term:   1         SAB:   1
 Living:       1
Gestational Age

 LMP:           24w 2d        Date:  [DATE]                 EDD:   [DATE]
 Best:          24w 2d     Det. By:  LMP  ([DATE])          EDD:   [DATE]
Anatomy

 Cranium:               Appears normal         Aortic Arch:            Appears normal
 Cavum:                 Appears normal         Diaphragm:              Appears normal
 Ventricles:            Appears normal         Stomach:                Appears normal, left
                                                                       sided
 Choroid Plexus:        Appears normal         Abdomen:                Appears normal
 Cerebellum:            Appears normal         Abdominal Wall:         Appears nml (cord
                                                                       insert, abd wall)
 Posterior Fossa:       Appears normal         Cord Vessels:           Appears normal (3
                                                                       vessel cord)
 Heart:                 Appears normal         Kidneys:                Appear normal
                        (4CH, axis, and
                        situs)
 RVOT:                  Appears normal         Bladder:                Appears normal
 LVOT:                  Appears normal
Cervix Uterus Adnexa

 Cervix
 Length:           4.57  cm.
 Normal appearance by transabdominal scan.

 Uterus
 No abnormality visualized.

 Right Ovary
 No adnexal mass visualized.

 Left Ovary
 No adnexal mass visualized.

 Cul De Sac
 No free fluid seen.

 Adnexa
 No adnexal mass visualized.
Comments

 A limited ultrasound performed today shows that the fetus is
 in the breech presentation.

 There was normal amniotic fluid noted.

## 2019-10-22 MED ORDER — CALCIUM CARBONATE ANTACID 500 MG PO CHEW: 2.0000 | CHEWABLE_TABLET | ORAL | Status: DC | PRN

## 2019-10-22 MED ORDER — ACETAMINOPHEN 325 MG PO TABS
650.0000 mg | ORAL_TABLET | ORAL | Status: DC | PRN
  Administered 2019-10-22 – 2019-10-23 (×4): 650 mg via ORAL
  Filled 2019-10-22 (×4): qty 2

## 2019-10-22 MED ORDER — SERTRALINE HCL 50 MG PO TABS
100.0000 mg | ORAL_TABLET | Freq: Every day | ORAL | Status: DC
  Administered 2019-10-22 – 2019-10-23 (×2): 100 mg via ORAL
  Filled 2019-10-22: qty 2
  Filled 2019-10-22: qty 1

## 2019-10-22 MED ORDER — ZOLPIDEM TARTRATE 5 MG PO TABS
5.0000 mg | ORAL_TABLET | Freq: Every evening | ORAL | Status: DC | PRN
  Administered 2019-10-23 (×2): 5 mg via ORAL
  Filled 2019-10-22 (×2): qty 1

## 2019-10-22 MED ORDER — DOCUSATE SODIUM 100 MG PO CAPS
100.0000 mg | ORAL_CAPSULE | Freq: Every day | ORAL | Status: DC
  Administered 2019-10-22 – 2019-10-24 (×3): 100 mg via ORAL
  Filled 2019-10-22 (×3): qty 1

## 2019-10-22 MED ORDER — PRENATAL MULTIVITAMIN CH
1.0000 | ORAL_TABLET | Freq: Every day | ORAL | Status: DC
  Administered 2019-10-23: 1 via ORAL
  Filled 2019-10-22: qty 1

## 2019-10-22 MED ORDER — BUSPIRONE HCL 5 MG PO TABS
5.0000 mg | ORAL_TABLET | Freq: Two times a day (BID) | ORAL | Status: DC
  Administered 2019-10-22 – 2019-10-24 (×4): 5 mg via ORAL
  Filled 2019-10-22 (×4): qty 1

## 2019-10-22 MED ORDER — ACETAMINOPHEN 500 MG PO TABS
1000.0000 mg | ORAL_TABLET | Freq: Once | ORAL | Status: AC
  Administered 2019-10-22: 1000 mg via ORAL
  Filled 2019-10-22: qty 2

## 2019-10-22 NOTE — MAU Note (Signed)
Tiffany Whitehead is a 29 y.o. at [redacted]w[redacted]d here in MAU reporting: last night around 2000 she fell, went to [facility] for evaluation, they did not do any prolonged monitoring. States today she has been cramping and has had some bleeding. Saw the bleeding when she wiped. Some DFM, but states she normally feels movement at night. No LOF.  Onset of complaint: today  Pain score: 7/10  Vitals:   10/22/19 1739  BP: 122/78  Pulse: (!) 106  Resp: 16  Temp: 98.7 F (37.1 C)  SpO2: 98%     FHT: 140  Lab orders placed from triage: none

## 2019-10-22 NOTE — MAU Provider Note (Signed)
History     CSN: [id number]  Arrival date and time: 10/22/19 1724   First Provider Initiated Contact with Patient 10/22/19 1807      Chief Complaint  Patient presents with   Tiffany Whitehead   Tiffany Whitehead is a 29 y.o. G3P1011 at [redacted]w[redacted]d who presents today after a fall. She states that on 10/21/2019 around 2000 she fell going over a baby gate. Her foot got caught and she propelled forward. She hit her face, abdomen and side. She was seen in the ED at [facility] for apprx 1 hour prior to being DC home. She states that today when she woke up she felt terrible and her belly has felt tight all day. Around 1400 she had bright red spotting when she wiped. She reports that she had 2 episodes of bright red spotting today, but this has since resolved. She also reports that she has not felt the baby move today. After the fall she peed herself, but denies any further leaking today. She rpeorts that she is certain it was urine as she noted that it smelled like urine.   Fall The accident occurred 12 to 24 hours ago. The fall occurred in unknown circumstances. She fell from a height of 3 to 5 ft. She landed on hard floor. The point of impact was the head and right hip (abdomen ). The pain is present in the head (abdomen ). Pertinent negatives include no fever, nausea or vomiting.    OB History    Gravida  3   Para  1   Term  1   Preterm      AB  1   Living  1     SAB  1   TAB      Ectopic      Multiple  0   Live Births  1           Past Medical History:  Diagnosis Date   Anxiety    Chronic kidney disease    Depression    H/O borderline personality disorder    stopped meds at beginning of pregnancy, Lamictal and Vyvanse   UTI (urinary tract infection)     Past Surgical History:  Procedure Laterality Date   APPENDECTOMY     COLONOSCOPY     extraction of wisdom teeth      Family History  Problem Relation Age of Onset   Cancer Mother    Cancer Other     Social History    Tobacco Use   Smoking status: Never Smoker   Smokeless tobacco: Never Used  Substance Use Topics   Alcohol use: Not Currently    Comment: social   Drug use: No    Allergies:  Allergies  Allergen Reactions   Ceclor [Cefaclor] Rash    Medications Prior to Admission  Medication Sig Dispense Refill Last Dose   acetaminophen (TYLENOL) 325 MG tablet Take 2 tablets (650 mg total) by mouth every 6 (six) hours as needed for mild pain or moderate pain. 90 tablet 0 10/21/2019 at Unknown time   busPIRone (BUSPAR) 5 MG tablet Take 5 mg by mouth 2 (two) times daily.   10/21/2019 at Unknown time   metoCLOPramide (REGLAN) 5 MG/5ML solution Take 10 mLs (10 mg total) by mouth every 8 (eight) hours. 473 mL 3 10/21/2019 at Unknown time   ondansetron (ZOFRAN-ODT) 8 MG disintegrating tablet Take 1 tablet (8 mg total) by mouth every 8 (eight) hours. 90 tablet 1 10/22/2019 at 0800  Prenatal Vit-Fe Fumarate-FA (MULTIVITAMIN-PRENATAL) 27-0.8 MG TABS tablet Take 1 tablet by mouth daily.   10/21/2019 at Unknown time   promethazine (PHENERGAN) 25 MG tablet Take 1 tablet (25 mg total) by mouth every 6 (six) hours. 120 tablet 0 Past Week at Unknown time   scopolamine (TRANSDERM-SCOP) 1 MG/3DAYS Place 1 patch onto the skin every 3 (three) days.   Past Month at Unknown time   sertraline (ZOLOFT) 100 MG tablet Take 100 mg by mouth at bedtime.   10/21/2019 at Unknown time   ondansetron (ZOFRAN-ODT) 8 MG disintegrating tablet Take 1 tablet (8 mg total) by mouth every 8 (eight) hours. 90 tablet 1    promethazine (PHENERGAN) 25 MG suppository Place 25 mg rectally every 6 (six) hours as needed.   More than a month at Unknown time    Review of Systems  Constitutional: Negative for chills and fever.  Gastrointestinal: Negative for nausea and vomiting.  Genitourinary: Positive for vaginal bleeding. Negative for dysuria, frequency, pelvic pain and vaginal discharge.   Physical Exam   Blood pressure 122/78, pulse  (!) 106, temperature 98.7 F (37.1 C), temperature source Oral, resp. rate 16, last menstrual period 04/01/2019, SpO2 98 %, unknown if currently breastfeeding.  Physical Exam  Nursing note and vitals reviewed. Constitutional: She is oriented to person, place, and time. She appears well-developed and well-nourished. No distress.  HENT:  Head: Normocephalic.  Cardiovascular: Normal rate.  Respiratory: Effort normal.  GI: Soft. There is abdominal tenderness. There is no rebound.  Neurological: She is alert and oriented to person, place, and time.  Skin: Skin is warm and dry.  Psychiatric: She has a normal mood and affect.   NST:  Baseline: 145 Variability: moderate Accels: 10x10 Decels: none Toco: frequent UI  Reactive/Appropriate for GA  Results for orders placed or performed during the hospital encounter of 10/22/19 (from the past 24 hour(s))  CBC     Status: Abnormal   Collection Time: 10/22/19  6:39 PM  Result Value Ref Range   WBC 11.9 (H) 4.0 - 10.5 K/uL   RBC 3.23 (L) 3.87 - 5.11 MIL/uL   Hemoglobin 10.2 (L) 12.0 - 15.0 g/dL   HCT 96.030.1 (L) 45.436.0 - 09.846.0 %   MCV 93.2 80.0 - 100.0 fL   MCH 31.6 26.0 - 34.0 pg   MCHC 33.9 30.0 - 36.0 g/dL   RDW 11.913.5 14.711.5 - 82.915.5 %   Platelets 203 150 - 400 K/uL   nRBC 0.0 0.0 - 0.2 %   US MFM OB Limited  Result Date: 10/22/2019 ----------------------------------------------------------------------  OBSTETRICS REPORT                       (Signed Final 10/22/2019 08:02 pm) ---------------------------------------------------------------------- Patient Info  ID #:       [id number]                          D.O.B.:  1990-06-13 (29 yrs)  Name:       Tiffany Whitehead                  Visit Date: 10/22/2019 07:08 pm ---------------------------------------------------------------------- Performed By  Attending:        Ma RingsVictor Fang MD         Secondary Phy.:    Juleen ChinaHEATHER D  Shiloh Southern CNM  Performed By:      Wilnette Kales        Address:           [address]                    [place]                                                              [place],                                                              [place] [address]  Referred By:      [facility] MAU/Triage         [facility] ---------------------------------------------------------------------- Orders  #  Description                           Code        Ordered By  1  [place] MFM OB LIMITED                     62229.79    Marcille Buffy ----------------------------------------------------------------------  #  Order #                     Accession #                Episode #  1  [id number]                   [id number]                 [id number] ---------------------------------------------------------------------- Indications  Vaginal bleeding in pregnancy, second           O46.92  trimester  Traumatic injury during pregnancy (fall         O9A.219 T14.90  yesterday)  [redacted] weeks gestation of pregnancy                 Z3A.24 ---------------------------------------------------------------------- Fetal Evaluation  Num Of Fetuses:          1  Fetal Heart Rate(bpm):   142  Cardiac Activity:        Observed  Presentation:            Breech  Placenta:                Posterior  P. Cord Insertion:       Visualized, central  Amniotic Fluid  AFI FV:      Within normal limits  Largest Pocket(cm)                              6.1  Comment:    No placental abruption or previa identified. ---------------------------------------------------------------------- OB History  Gravidity:    3         Term:   1         SAB:   1  Living:       1 ---------------------------------------------------------------------- Gestational Age  LMP:           24w 2d        Date:  05/05/19                 EDD:   02/09/20  Best:          Darien Ramus 2d     Det. By:  LMP  (05/05/19)           EDD:   02/09/20 ---------------------------------------------------------------------- Anatomy  Cranium:               Appears normal         Aortic Arch:            Appears normal  Cavum:                 Appears normal         Diaphragm:              Appears normal  Ventricles:            Appears normal         Stomach:                Appears normal, left                                                                        sided  Choroid Plexus:        Appears normal         Abdomen:                Appears normal  Cerebellum:            Appears normal         Abdominal Wall:         Appears nml (cord                                                                        insert, abd wall)  Posterior Fossa:       Appears normal         Cord Vessels:           Appears normal (3  vessel cord)  Heart:                 Appears normal         Kidneys:                Appear normal                         (4CH, axis, and                         situs)  RVOT:                  Appears normal         Bladder:                Appears normal  LVOT:                  Appears normal ---------------------------------------------------------------------- Cervix Uterus Adnexa  Cervix  Length:           4.57  cm.  Normal appearance by transabdominal scan.  Uterus  No abnormality visualized.  Right Ovary  No adnexal mass visualized.  Left Ovary  No adnexal mass visualized.  Cul De Sac  No free fluid seen.  Adnexa  No adnexal mass visualized. ---------------------------------------------------------------------- Comments  A limited ultrasound performed today shows that the fetus is  in the breech presentation.  There was normal amniotic fluid noted. ----------------------------------------------------------------------                   Ma Rings, MD Electronically Signed Final Report   10/22/2019 08:02 pm  ----------------------------------------------------------------------  MAU Course  Procedures  MDM [name] notified of admission  NICU notified Dr. Mikle Bosworth ok with admission.   Assessment and Plan   Vaginal bleeding after a fall with UI and occasional UC Admit to [facility] for 24 hour obs NICU notified ok with admission   Thressa Sheller DNP, CNM  10/22/19  8:10 PM

## 2019-10-23 DIAGNOSIS — Z3A24 24 weeks gestation of pregnancy: Secondary | ICD-10-CM | POA: Diagnosis not present

## 2019-10-23 DIAGNOSIS — O9A212 Injury, poisoning and certain other consequences of external causes complicating pregnancy, second trimester: Secondary | ICD-10-CM | POA: Diagnosis not present

## 2019-10-23 DIAGNOSIS — W010XXA Fall on same level from slipping, tripping and stumbling without subsequent striking against object, initial encounter: Secondary | ICD-10-CM | POA: Diagnosis not present

## 2019-10-23 DIAGNOSIS — S3991XA Unspecified injury of abdomen, initial encounter: Secondary | ICD-10-CM | POA: Diagnosis not present

## 2019-10-23 NOTE — Progress Notes (Signed)
Patient ID: Tiffany Whitehead, female   DOB: May 01, 1991, 29 y.o.   MRN: [id number] FACULTY PRACTICE ANTEPARTUM(COMPREHENSIVE) NOTE  Tiffany Whitehead is a 29 y.o. G3P1011 at [redacted]w[redacted]d who is admitted for abdominal trauma in pregnancy.    Fetal presentation is breech. Length of Stay:  0  Days  Date of admission:10/22/2019  Subjective: Patient is doing well without complaints. She denies any further episodes of contractions or vaginal bleeding. Patient is eager to go home Patient reports the fetal movement as active. Patient reports uterine contraction  activity as none. Patient reports  vaginal bleeding as none. Patient describes fluid per vagina as None.  Vitals:  Blood pressure (!) 85/42, pulse 80, temperature 98 F (36.7 C), temperature source Oral, resp. rate 18, height 5\' 8"  (1.727 m), weight 70.3 kg, last menstrual period 04/01/2019, SpO2 100 %, unknown if currently breastfeeding. Vitals:   10/22/19 2356 10/23/19 0151 10/23/19 0650 10/23/19 0753  BP: 108/62 115/66 (!) 91/52 (!) 85/42  Pulse: 82 75 75 80  Resp:  18 18 18   Temp:  98 F (36.7 C) 98.1 F (36.7 C) 98 F (36.7 C)  TempSrc:  Oral Oral Oral  SpO2:  100% 100% 100%  Weight:      Height:       Physical Examination: GENERAL: Well-developed, well-nourished female in no acute distress.  LUNGS: Clear to auscultation bilaterally.  HEART: Regular rate and rhythm. ABDOMEN: Soft, nontender, gravid. No organomegaly. PELVIC: Not indicated EXTREMITIES: No cyanosis, clubbing, or edema, 2+ distal pulses.   Fetal Monitoring:  Baseline: 135 bpm, Variability: Good {> 6 bpm), Accelerations: Reactive and Decelerations: Absent   reactive  Labs:  Results for orders placed or performed during the hospital encounter of 10/22/19 (from the past 24 hour(s))  CBC   Collection Time: 10/22/19  6:39 PM  Result Value Ref Range   WBC 11.9 (H) 4.0 - 10.5 K/uL   RBC 3.23 (L) 3.87 - 5.11 MIL/uL   Hemoglobin 10.2 (L) 12.0 - 15.0 g/dL   HCT 12/22/19 (L) 12/22/19 -  46.0 %   MCV 93.2 80.0 - 100.0 fL   MCH 31.6 26.0 - 34.0 pg   MCHC 33.9 30.0 - 36.0 g/dL   RDW 01.7 51.0 - 25.8 %   Platelets 203 150 - 400 K/uL   nRBC 0.0 0.0 - 0.2 %  Type and screen [facility]   Collection Time: 10/22/19  8:36 PM  Result Value Ref Range   ABO/RH(D) A POS    Antibody Screen NEG    Sample Expiration      10/25/2019,2359 Performed at [facility], [address] [place], [address] [place]   ABO/Rh   Collection Time: 10/22/19  8:36 PM  Result Value Ref Range   ABO/RH(D)      A POS Performed at [facility], [address] [place], [address] [place]   SARS Coronavirus 2 by RT PCR (hospital order, performed in [facility] hospital lab) Nasopharyngeal Nasopharyngeal Swab   Collection Time: 10/22/19  8:43 PM   Specimen: Nasopharyngeal Swab  Result Value Ref Range   SARS Coronavirus 2 NEGATIVE NEGATIVE    Imaging Studies:    [facility] MFM OB Limited  Result Date: 10/22/2019 ----------------------------------------------------------------------  OBSTETRICS REPORT                       (Signed Final 10/22/2019 08:02 pm) ---------------------------------------------------------------------- Patient Info  ID #:  [id number]                          D.O.B.:  [birth year] (29 yrs)  Name:       Tiffany Whitehead                  Visit Date: 10/22/2019 07:08 pm ---------------------------------------------------------------------- Performed By  Attending:        Ma RingsVictor Fang MD         Secondary Phy.:    Armando ReichertHEATHER D                                                              HOGAN CNM  Performed By:     Birdena CrandallYasemin Karatas        Address:           [address]                    [place]                                                              [address] [place],                                                              [place] [id number]  Referred By:      [facility]         [facility] ---------------------------------------------------------------------- Orders  #  Description                           Code        Ordered By  1  US MFM OB LIMITED                     [id number]    Thressa ShellerHEATHER HOGAN ----------------------------------------------------------------------  #  Order #                     Accession #                Episode #  1  [id number]                   [id number]                 [id number] ---------------------------------------------------------------------- Indications  Vaginal bleeding in pregnancy, second  O46.92  trimester  Traumatic injury during pregnancy (fall         O9A.219 T14.90  yesterday)  [redacted] weeks gestation of pregnancy                 Z3A.24 ---------------------------------------------------------------------- Fetal Evaluation  Num Of Fetuses:          1  Fetal Heart Rate(bpm):   142  Cardiac Activity:        Observed  Presentation:            Breech  Placenta:                Posterior  P. Cord Insertion:       Visualized, central  Amniotic Fluid  AFI FV:      Within normal limits                              Largest Pocket(cm)                              6.1  Comment:    No placental abruption or previa identified. ---------------------------------------------------------------------- OB History  Gravidity:    3         Term:   1         SAB:   1  Living:       1 ---------------------------------------------------------------------- Gestational Age  LMP:           24w 2d        Date:  05/05/19                 EDD:   02/09/20  Best:          Sharmon Leyden 2d     Det. By:  LMP  (05/05/19)          EDD:   02/09/20 ---------------------------------------------------------------------- Anatomy  Cranium:               Appears normal         Aortic Arch:            Appears normal  Cavum:                 Appears normal         Diaphragm:              Appears normal  Ventricles:            Appears normal         Stomach:                Appears normal, left                                                                         sided  Choroid Plexus:        Appears normal         Abdomen:                Appears normal  Cerebellum:            Appears normal         Abdominal Wall:  Appears nml (cord                                                                        insert, abd wall)  Posterior Fossa:       Appears normal         Cord Vessels:           Appears normal (3                                                                        vessel cord)  Heart:                 Appears normal         Kidneys:                Appear normal                         (4CH, axis, and                         situs)  RVOT:                  Appears normal         Bladder:                Appears normal  LVOT:                  Appears normal ---------------------------------------------------------------------- Cervix Uterus Adnexa  Cervix  Length:           4.57  cm.  Normal appearance by transabdominal scan.  Uterus  No abnormality visualized.  Right Ovary  No adnexal mass visualized.  Left Ovary  No adnexal mass visualized.  Cul De Sac  No free fluid seen.  Adnexa  No adnexal mass visualized. ---------------------------------------------------------------------- Comments  A limited ultrasound performed today shows that the fetus is  in the breech presentation.  There was normal amniotic fluid noted. ----------------------------------------------------------------------                   Ma Rings, MD Electronically Signed Final Report   10/22/2019 08:02 pm ----------------------------------------------------------------------    Medications:  Scheduled . busPIRone  5 mg Oral BID  . docusate sodium  100 mg Oral Daily  . prenatal multivitamin  1 tablet Oral Q1200  . sertraline  100 mg Oral QHS   I have reviewed the patient's current medications.  ASSESSMENT:  Patient Active Problem List   Diagnosis Date Noted  . Abdominal trauma 10/22/2019  . Fall 10/21/2019   . Hyperemesis affecting pregnancy, antepartum 09/23/2019  . Nausea and vomiting during pregnancy 09/14/2019  . Supervision of normal pregnancy 07/10/2019  . Anxiety 11/22/2017  . GAD (generalized anxiety disorder) 07/04/2015  . ADHD (attention deficit hyperactivity disorder), combined type 07/04/2015  . Family history of colon cancer 07/04/2015  . Lactose intolerance 07/04/2015  .  Transient gluten sensitivity 07/04/2015    PLAN: - Reviewed limited ultrasound findings with the patient - Discussed need for observation for 24 hours - Patient verbalized understanding and all questions were answered - Plan for discharge home tomorrow if patient remains stable - Continue current antepartum care  Tiffany Whitehead 10/23/2019,10:01 AM

## 2019-10-23 NOTE — H&P (Signed)
Obstetric History and Physical  Tiffany Whitehead is a 29 y.o. G3P1011 with IUP at [redacted]w[redacted]d who presents today after a fall. She states that on 10/21/2019 around 2000 she fell going over a baby gate. Her foot got caught and she propelled forward. She hit her face, abdomen and side. She was seen in the ED at [facility] for apprx 1 hour prior to being DC home. She states that today when she woke up she felt terrible and her belly has felt tight all day. Around 1400 she had bright red spotting when she wiped. She reports that she had 2 episodes of bright red spotting today, but this has since resolved. She also reports that she has not felt the baby move today. After the fall she peed herself, but denies any further leaking today. She reports that she is certain it was urine as she noted that it smelled like urine.   Prenatal Course Source of Care: [facility] with onset of care at 1st trimester Pregnancy complications or risks: Patient Active Problem List   Diagnosis Date Noted  . Abdominal trauma 10/22/2019  . Fall 10/21/2019  . Hyperemesis affecting pregnancy, antepartum 09/23/2019  . Nausea and vomiting during pregnancy 09/14/2019  . Supervision of normal pregnancy 07/10/2019  . Anxiety 11/22/2017  . GAD (generalized anxiety disorder) 07/04/2015  . ADHD (attention deficit hyperactivity disorder), combined type 07/04/2015  . Family history of colon cancer 07/04/2015  . Lactose intolerance 07/04/2015  . Transient gluten sensitivity 07/04/2015   Prenatal labs and studies: ABO, Rh: --/--/A POS, A POS Performed at [facility], [place] [address] [place], [place] [address]  [phone]) Antibody: NEG (06/06 2036) Rubella: Immune (02/22 0000) RPR: Nonreactive, Nonreactive (02/22 0000)  HBsAg: Negative (02/22 0000)  HIV:    GBS:  2 hr GTT:  n/a  Medical History:  Past Medical History:  Diagnosis Date  . Anxiety   . Chronic kidney disease   . Depression   . H/O borderline personality disorder     stopped meds at beginning of pregnancy, Lamictal and Vyvanse  . UTI (urinary tract infection)     Past Surgical History:  Procedure Laterality Date  . APPENDECTOMY    . COLONOSCOPY    . extraction of wisdom teeth      OB History  Gravida Para Term Preterm AB Living  3 1 1   1 1   SAB TAB Ectopic Multiple Live Births  1     0 1    # Outcome Date GA Lbr Len/2nd Weight Sex Delivery Anes PTL Lv  3 Current           2 Term 11/22/17 [redacted]w[redacted]d 31:13 / 00:49 4500 g F Vag-Spont EPI  LIV  1 SAB             Social History   Socioeconomic History  . Marital status: Married    Spouse name: Not on file  . Number of children: Not on file  . Years of education: Not on file  . Highest education level: Not on file  Occupational History  . Not on file  Tobacco Use  . Smoking status: Never Smoker  . Smokeless tobacco: Never Used  Substance and Sexual Activity  . Alcohol use: Not Currently    Comment: social  . Drug use: No  . Sexual activity: Yes    Birth control/protection: None  Other Topics Concern  . Not on file  Social History Narrative  . Not on file  Social Determinants of Health   Financial Resource Strain:   . Difficulty of Paying Living Expenses:   Food Insecurity:   . Worried About Programme researcher, broadcasting/film/video in the Last Year:   . Barista in the Last Year:   Transportation Needs:   . Freight forwarder (Medical):   Marland Kitchen Lack of Transportation (Non-Medical):   Physical Activity:   . Days of Exercise per Week:   . Minutes of Exercise per Session:   Stress:   . Feeling of Stress :   Social Connections:   . Frequency of Communication with Friends and Family:   . Frequency of Social Gatherings with Friends and Family:   . Attends Religious Services:   . Active Member of Clubs or Organizations:   . Attends Banker Meetings:   Marland Kitchen Marital Status:    Family History  Problem Relation Age of Onset  . Cancer Mother   . Cancer Other     Medications  Prior to Admission  Medication Sig Dispense Refill Last Dose  . acetaminophen (TYLENOL) 325 MG tablet Take 2 tablets (650 mg total) by mouth every 6 (six) hours as needed for mild pain or moderate pain. 90 tablet 0 10/21/2019 at Unknown time  . busPIRone (BUSPAR) 5 MG tablet Take 5 mg by mouth 2 (two) times daily.   10/21/2019 at Unknown time  . metoCLOPramide (REGLAN) 5 MG/5ML solution Take 10 mLs (10 mg total) by mouth every 8 (eight) hours. 473 mL 3 10/21/2019 at Unknown time  . ondansetron (ZOFRAN-ODT) 8 MG disintegrating tablet Take 1 tablet (8 mg total) by mouth every 8 (eight) hours. 90 tablet 1 10/22/2019 at 0800  . Prenatal Vit-Fe Fumarate-FA (MULTIVITAMIN-PRENATAL) 27-0.8 MG TABS tablet Take 1 tablet by mouth daily.   10/21/2019 at Unknown time  . promethazine (PHENERGAN) 25 MG tablet Take 1 tablet (25 mg total) by mouth every 6 (six) hours. 120 tablet 0 Past Week at Unknown time  . scopolamine (TRANSDERM-SCOP) 1 MG/3DAYS Place 1 patch onto the skin every 3 (three) days.   Past Month at Unknown time  . sertraline (ZOLOFT) 100 MG tablet Take 100 mg by mouth at bedtime.   10/21/2019 at Unknown time  . ondansetron (ZOFRAN-ODT) 8 MG disintegrating tablet Take 1 tablet (8 mg total) by mouth every 8 (eight) hours. 90 tablet 1   . promethazine (PHENERGAN) 25 MG suppository Place 25 mg rectally every 6 (six) hours as needed.   More than a month at Unknown time    Allergies  Allergen Reactions  . Ceclor [Cefaclor] Rash    Review of Systems: Negative except for what is mentioned in HPI.  Physical Exam: BP 108/62   Pulse 82   Temp 98.3 F (36.8 C) (Oral)   Resp 18   Ht 5\' 8"  (1.727 m)   Wt 70.3 kg   LMP 04/01/2019   SpO2 100%   BMI 23.57 kg/m  CONSTITUTIONAL: Well-developed, well-nourished female in no acute distress. Anxious  HENT:  Normocephalic, atraumatic, External right and left ear normal. Oropharynx is clear and moist EYES: Conjunctivae and EOM are normal. Pupils are equal, round, and  reactive to light. No scleral icterus.  NECK: Normal range of motion, supple, no masses SKIN: Skin is warm and dry. No rash noted. Not diaphoretic. No erythema. No pallor. NEUROLOGIC: Alert and oriented to person, place, and time. Normal reflexes, muscle tone coordination. No cranial nerve deficit noted. PSYCHIATRIC: Normal mood and affect. Normal behavior. Normal judgment  and thought content. CARDIOVASCULAR: Normal heart rate noted, regular rhythm RESPIRATORY: Effort normal, no problems with respiration noted ABDOMEN: Soft, moderately tender on right side of abdomen in upper and lower quadrants, gravid. MUSCULOSKELETAL: Normal range of motion. No edema and no tenderness. 2+ distal pulses.  Cervical Exam: deferred Presentation: breech  FHT:  Baseline rate 140 bpm   Variability moderate  Accelerations absent   Decelerations none Contractions: occasional   Pertinent Labs/Studies:   Results for orders placed or performed during the hospital encounter of 10/22/19 (from the past 24 hour(s))  CBC     Status: Abnormal   Collection Time: 10/22/19  6:39 PM  Result Value Ref Range   WBC 11.9 (H) 4.0 - 10.5 K/uL   RBC 3.23 (L) 3.87 - 5.11 MIL/uL   Hemoglobin 10.2 (L) 12.0 - 15.0 g/dL   HCT 02.5 (L) 42.7 - 06.2 %   MCV 93.2 80.0 - 100.0 fL   MCH 31.6 26.0 - 34.0 pg   MCHC 33.9 30.0 - 36.0 g/dL   RDW 37.6 28.3 - 15.1 %   Platelets 203 150 - 400 K/uL   nRBC 0.0 0.0 - 0.2 %  Type and screen [facility]     Status: None   Collection Time: 10/22/19  8:36 PM  Result Value Ref Range   ABO/RH(D) A POS    Antibody Screen NEG    Sample Expiration      10/25/2019,2359 Performed at [facility], [address], [place], [place] [address]   ABO/Rh     Status: None   Collection Time: 10/22/19  8:36 PM  Result Value Ref Range   ABO/RH(D)      A POS Performed at [facility], [address] [place], [place] [address]   SARS Coronavirus 2 by RT PCR (hospital order,  performed in [facility] hospital lab) Nasopharyngeal Nasopharyngeal Swab     Status: None   Collection Time: 10/22/19  8:43 PM   Specimen: Nasopharyngeal Swab  Result Value Ref Range   SARS Coronavirus 2 NEGATIVE NEGATIVE    Assessment : Tiffany Whitehead is a 29 y.o. G3P1011 at [redacted]w[redacted]d being admitted for abdominal trauma to right side of abdomen with light bleeding noted after. Reviewed importance of monitoring baby after fall given significant tenderness and light spotting. She is agreeable. In house obs for now. Reviewed course with patient, she is agreeable.   RH positive Clear liquid diet Cont toco and EFM Monitor VB   Baldemar Lenis, M.D. Attending [facility] [employer] (Faculty Practice)  10/23/2019, 12:13 AM

## 2019-10-24 DIAGNOSIS — Z3A24 24 weeks gestation of pregnancy: Secondary | ICD-10-CM | POA: Diagnosis not present

## 2019-10-24 DIAGNOSIS — S3991XA Unspecified injury of abdomen, initial encounter: Secondary | ICD-10-CM | POA: Diagnosis not present

## 2019-10-24 DIAGNOSIS — W010XXA Fall on same level from slipping, tripping and stumbling without subsequent striking against object, initial encounter: Secondary | ICD-10-CM | POA: Diagnosis not present

## 2019-10-24 DIAGNOSIS — O9A212 Injury, poisoning and certain other consequences of external causes complicating pregnancy, second trimester: Secondary | ICD-10-CM | POA: Diagnosis not present

## 2019-10-24 MED ORDER — ONDANSETRON HCL 4 MG PO TABS
4.0000 mg | ORAL_TABLET | Freq: Four times a day (QID) | ORAL | Status: DC | PRN
  Administered 2019-10-24: 4 mg via ORAL
  Filled 2019-10-24: qty 1

## 2019-10-24 MED ORDER — ONDANSETRON HCL 4 MG/5ML PO SOLN: 4.0000 mg | Freq: Four times a day (QID) | ORAL | Status: DC | PRN

## 2019-10-24 NOTE — Discharge Instructions (Signed)
Placental Abruption  The placenta is the organ formed during pregnancy. It carries oxygen and nutrients to the unborn baby (fetus). The placenta is the baby's life support system. In normal circumstances, it remains attached to the inside of the uterus until the baby is born. Placental abruption is a condition in which the placenta partly or completely separates from the uterus before the baby is born. Placental abruption is rare, but it can happen any time after 20 weeks of pregnancy. A small separation may not cause problems, but a large separation may be dangerous for you and your baby. A large separation is usually an emergency that requires treatment right away. What are the causes? In most cases, the cause of this condition is not known. What increases the risk? This condition is more likely to develop in women who:  Have experienced a recent trauma such as a fall, an injury to the abdomen, or a car accident.  Have had a previous placental abruption.  Have high blood pressure.  Smoke cigarettes, use alcohol, or use drugs, such as cocaine.  Have multiples (twins, triplets, or more).  Have too much amniotic fluid (polyhydramnios).  Are 35 years of age or older. What are the signs or symptoms? Symptoms of this condition can be mild or severe. A small placental abruption may not cause symptoms, or it may cause mild symptoms, which may include:  Mild pain in the abdomen or lower back.  Slight bleeding in the vagina. A severe placental abruption will cause symptoms. The symptoms will depend on the size of the separation and the stage of pregnancy. They may include:  Severe pain in the abdomen or lower back.  Bleeding from the vagina.  Ongoing contractions of your uterus with little or no relaxation of your uterus between contractions. How is this diagnosed? There are no tests to diagnose placental abruption. However, your health care provider may suspect that you have this  condition based on:  Your symptoms.  A physical exam.  Ultrasound findings.  Blood tests. How is this treated? Treatment for placental abruption depends on the severity of the condition.  Mild cases may be monitored through close observation. You may be admitted to the hospital during this time.  Severe cases may require emergency treatment. This may involve: ? Admission to the hospital. ? Emergency cesarean delivery of your baby. ? A blood transfusion or other fluids given through an IV. Follow these instructions at home: Activity  Get plenty of rest and sleep.  Do not have sex until your health care provider says it is okay. Lifestyle  Do not use drugs.  Do not drink alcohol.  Do not use tampons or douche unless your health care provider says it is okay.  Do not use any products that contain nicotine or tobacco, such as cigarettes, e-cigarettes, and chewing tobacco. If you need help quitting, ask your health care provider. General instructions  Take over-the-counter and prescription medicines only as told by your health care provider.  Do not take any medicines that your health care provider has not approved.  Arrange for help at home so you can get adequate rest.  Keep all follow-up visits as told by your health care provider. This is important. Contact a health care provider if:  You have vaginal spotting.  You are having mild, regular contractions. Get help right away if:  You have moderate or heavy vaginal bleeding or spotting.  You have severe pain in the abdomen.  You have continuous uterine   contractions.  You have a hard, tender uterus.  You have any type of trauma, such as an injury to the abdomen, a fall, or a car accident.  You do not feel the baby move, or the baby moves very little. Summary  Placental abruption is a condition in which the placenta partly or completely separates from the uterus before the baby is born.  Placental abruption  is a medical emergency that requires treatment right away.  Contact a health care provider if you have vaginal bleeding, or if you have mild, regular contractions.  Get help right away if you have heavy vaginal bleeding, severe pain in the abdomen, continuous uterine contractions, or you do not feel the baby move.  Keep all follow-up visits as told by your health care provider. This is important. This information is not intended to replace advice given to you by your health care provider. Make sure you discuss any questions you have with your health care provider. Document Revised: 10/27/2018 Document Reviewed: 10/27/2018 Elsevier Patient Education  2020 [employer]  

## 2019-10-24 NOTE — Discharge Summary (Addendum)
Physician Discharge Summary  Patient ID: Tiffany Whitehead MRN: [id number] DOB/AGE: 1991/01/21 29 y.o.  Admit date: 10/22/2019 Discharge date: 10/24/2019  Admission Diagnoses: Abdominal trauma in pregnancy  Discharge Diagnoses:  Active Problems:   Abdominal trauma   Discharged Condition: good  Hospital Course: Patient admitted for 24 hour observation following fall on her gravid abdomen. Patient denied any episode of abdominal cramping or vaginal bleeding since her admission. She reports good fetal movement and denies leakage of fluid. Fetal status remained reassuring during her stay. Patient stable for discharge home with plans to follow up with her Ob/Gyn in [place].  Consults: None   Discharge Exam: Blood pressure 106/63, pulse 84, temperature 98.4 F (36.9 C), temperature source Oral, resp. rate 16, height 5\' 8"  (1.727 m), weight 70.3 kg, last menstrual period 04/01/2019, SpO2 100 %, unknown if currently breastfeeding. GENERAL: Well-developed, well-nourished female in no acute distress.  LUNGS: Clear to auscultation bilaterally.  HEART: Regular rate and rhythm. ABDOMEN: Soft, nontender, gravid PELVIC: Not performed EXTREMITIES: No cyanosis, clubbing, or edema, 2+ distal pulses.  FHT: baseline 135, mod variability, +accels, no decels Toco: no contractions  Disposition:  There are no questions and answers to display.         Allergies as of 10/24/2019      Reactions   Ceclor [cefaclor] Rash      Medication List    TAKE these medications   acetaminophen 325 MG tablet Commonly known as: TYLENOL Take 2 tablets (650 mg total) by mouth every 6 (six) hours as needed for mild pain or moderate pain.   busPIRone 5 MG tablet Commonly known as: BUSPAR Take 5 mg by mouth 2 (two) times daily.   metoCLOPramide 5 MG/5ML solution Commonly known as: REGLAN Take 10 mLs (10 mg total) by mouth every 8 (eight) hours.   multivitamin-prenatal 27-0.8 MG Tabs tablet Take 1 tablet by  mouth daily.   ondansetron 8 MG disintegrating tablet Commonly known as: ZOFRAN-ODT Take 1 tablet (8 mg total) by mouth every 8 (eight) hours.   ondansetron 8 MG disintegrating tablet Commonly known as: ZOFRAN-ODT Take 1 tablet (8 mg total) by mouth every 8 (eight) hours.   promethazine 25 MG suppository Commonly known as: PHENERGAN Place 25 mg rectally every 6 (six) hours as needed.   promethazine 25 MG tablet Commonly known as: PHENERGAN Take 1 tablet (25 mg total) by mouth every 6 (six) hours.   scopolamine 1 MG/3DAYS Commonly known as: TRANSDERM-SCOP Place 1 patch onto the skin every 3 (three) days.   sertraline 100 MG tablet Commonly known as: ZOLOFT Take 100 mg by mouth at bedtime.        Signed: Raevin Wierenga 10/24/2019, 9:53 AM

## 2019-10-24 NOTE — Plan of Care (Signed)
  Problem: Education: Goal: Knowledge of General Education information will improve Description: Including pain rating scale, medication(s)/side effects and non-pharmacologic comfort measures Outcome: Completed/Met   Problem: Activity: Goal: Risk for activity intolerance will decrease Outcome: Completed/Met   Problem: Nutrition: Goal: Adequate nutrition will be maintained Outcome: Completed/Met   Problem: Coping: Goal: Level of anxiety will decrease Outcome: Completed/Met   Problem: Pain Managment: Goal: General experience of comfort will improve Outcome: Completed/Met   Problem: Safety: Goal: Ability to remain free from injury will improve Outcome: Completed/Met   Problem: Education: Goal: Knowledge of disease or condition will improve Outcome: Completed/Met Goal: Knowledge of the prescribed therapeutic regimen will improve Outcome: Completed/Met

## 2019-11-21 ENCOUNTER — Observation Stay
Admission: EM | Admit: 2019-11-21 | Discharge: 2019-11-21 | Disposition: A | Discharge status: Home / Self Care | Payer: BC Managed Care – PPO | Attending: Obstetrics and Gynecology | Admitting: Obstetrics and Gynecology

## 2019-11-21 ENCOUNTER — Observation Stay: Payer: BC Managed Care – PPO

## 2019-11-21 DIAGNOSIS — O26893 Other specified pregnancy related conditions, third trimester: Secondary | ICD-10-CM | POA: Diagnosis present

## 2019-11-21 DIAGNOSIS — F419 Anxiety disorder, unspecified: Secondary | ICD-10-CM | POA: Diagnosis not present

## 2019-11-21 DIAGNOSIS — O99343 Other mental disorders complicating pregnancy, third trimester: Secondary | ICD-10-CM | POA: Insufficient documentation

## 2019-11-21 DIAGNOSIS — F329 Major depressive disorder, single episode, unspecified: Secondary | ICD-10-CM | POA: Insufficient documentation

## 2019-11-21 DIAGNOSIS — O321XX0 Maternal care for breech presentation, not applicable or unspecified: Secondary | ICD-10-CM | POA: Diagnosis not present

## 2019-11-21 DIAGNOSIS — Z79899 Other long term (current) drug therapy: Secondary | ICD-10-CM | POA: Insufficient documentation

## 2019-11-21 DIAGNOSIS — N134 Hydroureter: Secondary | ICD-10-CM | POA: Insufficient documentation

## 2019-11-21 DIAGNOSIS — O4700 False labor before 37 completed weeks of gestation, unspecified trimester: Secondary | ICD-10-CM

## 2019-11-21 DIAGNOSIS — B3749 Other urogenital candidiasis: Secondary | ICD-10-CM | POA: Insufficient documentation

## 2019-11-21 DIAGNOSIS — M545 Low back pain: Secondary | ICD-10-CM | POA: Insufficient documentation

## 2019-11-21 DIAGNOSIS — Z3A29 29 weeks gestation of pregnancy: Secondary | ICD-10-CM | POA: Insufficient documentation

## 2019-11-21 DIAGNOSIS — O2340 Unspecified infection of urinary tract in pregnancy, unspecified trimester: Secondary | ICD-10-CM

## 2019-11-21 LAB — CBC
HCT: 28.9 % — ABNORMAL LOW (ref 36.0–46.0)
Hemoglobin: 9.9 g/dL — ABNORMAL LOW (ref 12.0–15.0)
MCH: 30.9 pg (ref 26.0–34.0)
MCHC: 34.3 g/dL (ref 30.0–36.0)
MCV: 90.3 fL (ref 80.0–100.0)
Platelets: 182 10*3/uL (ref 150–400)
RBC: 3.2 MIL/uL — ABNORMAL LOW (ref 3.87–5.11)
RDW: 13.6 % (ref 11.5–15.5)
WBC: 10.3 10*3/uL (ref 4.0–10.5)
nRBC: 0 % (ref 0.0–0.2)

## 2019-11-21 LAB — COMPREHENSIVE METABOLIC PANEL
ALT: 10 U/L (ref 0–44)
AST: 17 U/L (ref 15–41)
Albumin: 2.8 g/dL — ABNORMAL LOW (ref 3.5–5.0)
Alkaline Phosphatase: 70 U/L (ref 38–126)
Anion gap: 7 (ref 5–15)
BUN: 7 mg/dL (ref 6–20)
CO2: 23 mmol/L (ref 22–32)
Calcium: 8.6 mg/dL — ABNORMAL LOW (ref 8.9–10.3)
Chloride: 105 mmol/L (ref 98–111)
Creatinine, Ser: 0.61 mg/dL (ref 0.44–1.00)
GFR calc Af Amer: >60 mL/min (ref >60)
GFR calc non Af Amer: >60 mL/min (ref >60)
Glucose, Bld: 98 mg/dL (ref 70–99)
Potassium: 3.8 mmol/L (ref 3.5–5.1)
Sodium: 135 mmol/L (ref 135–145)
Total Bilirubin: 0.5 mg/dL (ref 0.3–1.2)
Total Protein: 6 g/dL — ABNORMAL LOW (ref 6.5–8.1)

## 2019-11-21 IMAGING — US US RENAL
2 series · 13 of 25 positions shown · non-contrast
Comparison: None.

CLINICAL DATA: Right flank region tenderness. Third trimester
gestation

EXAM:
RENAL / URINARY TRACT ULTRASOUND COMPLETE

[Series 1: us renal · 12 of 37 slices shown]
[im 1/37]
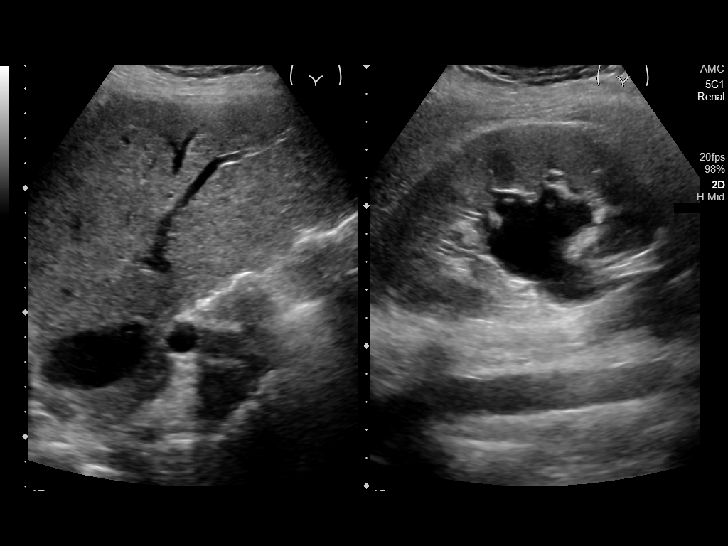
[im 4/37]
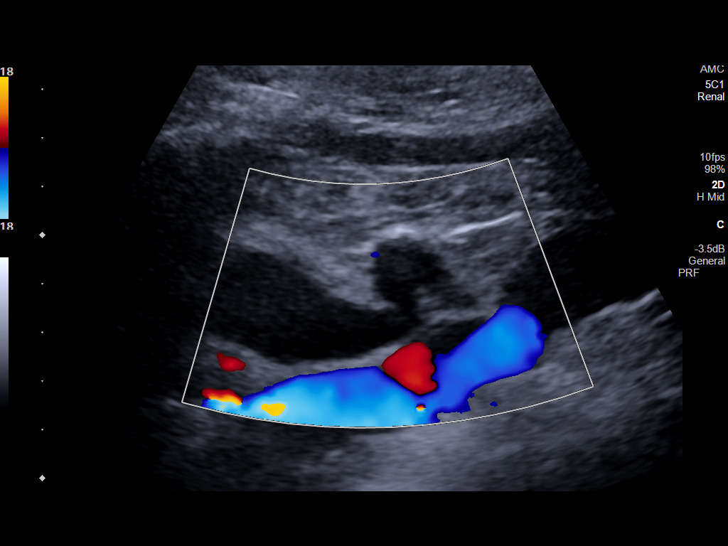
[im 7/37]
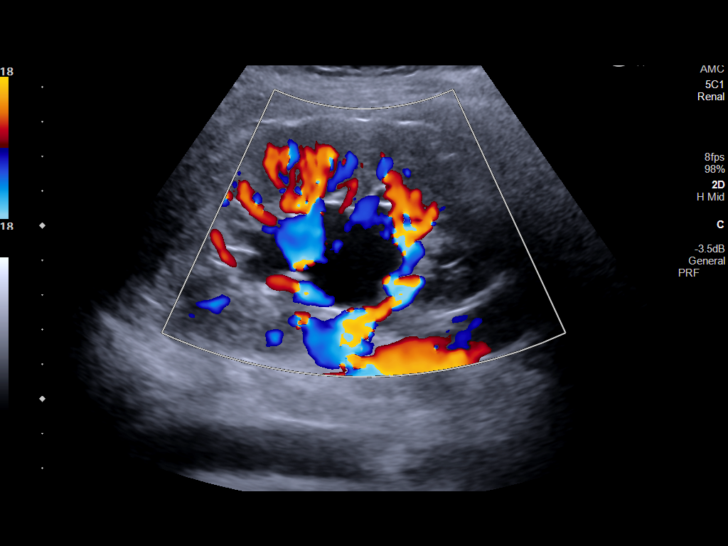
[im 10/37]
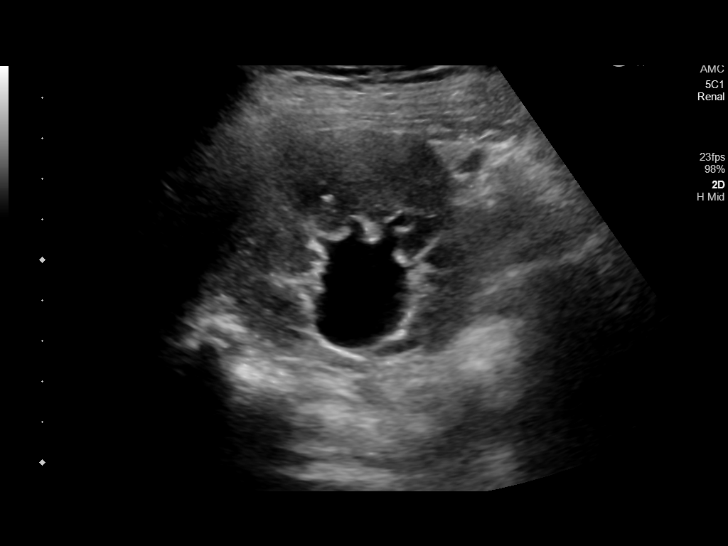
[im 14/37]
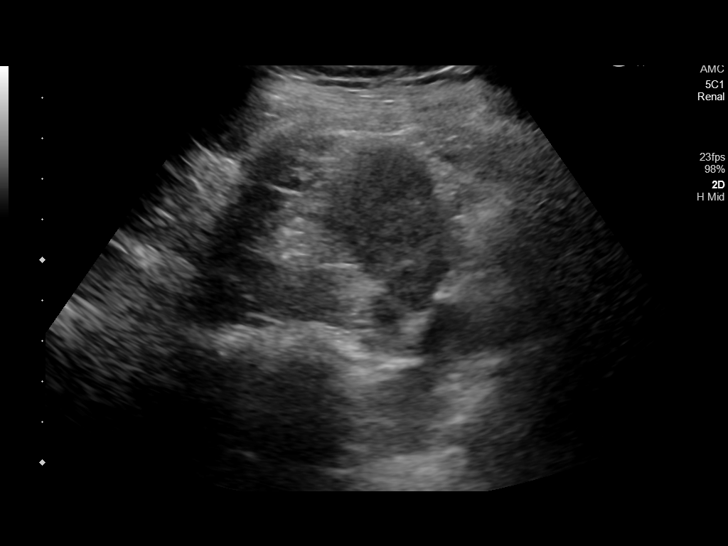
[im 17/37]
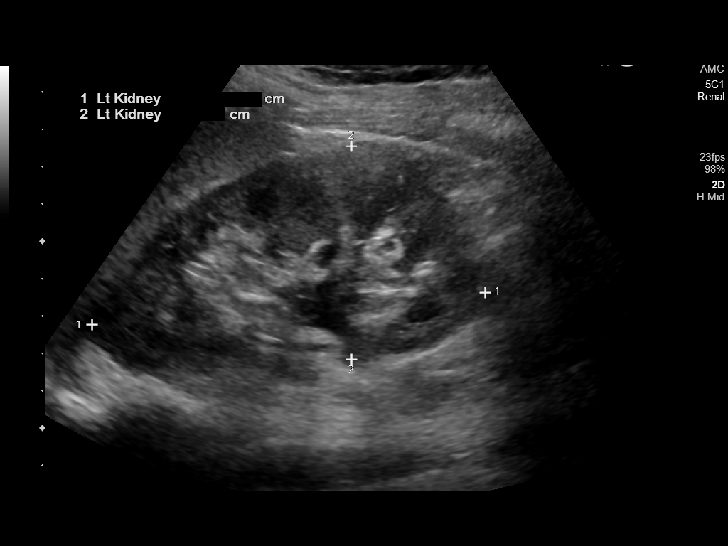
[im 20/37]
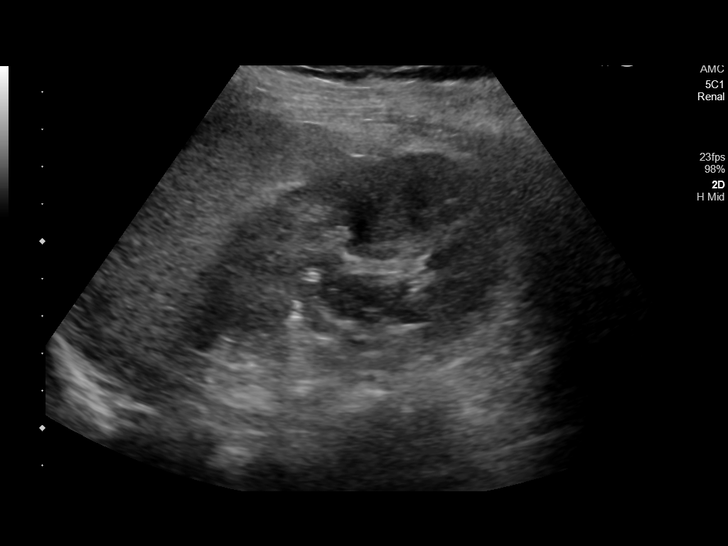
[im 23/37]
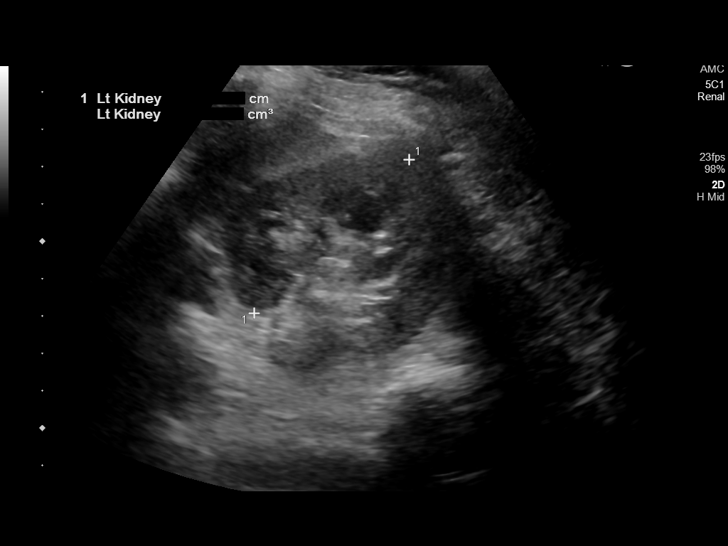
[im 27/37]
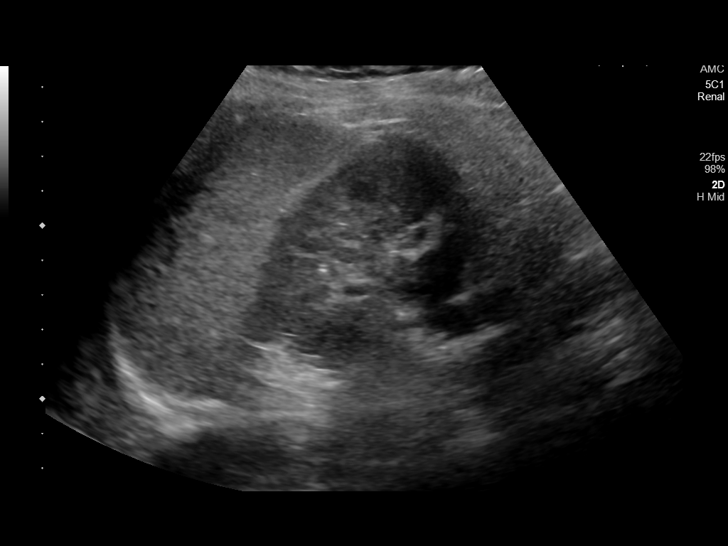
[im 30/37]
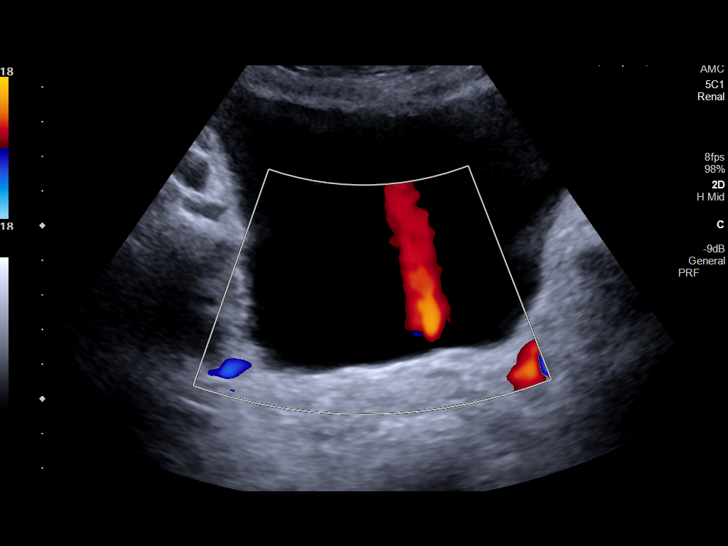
[im 33/37]
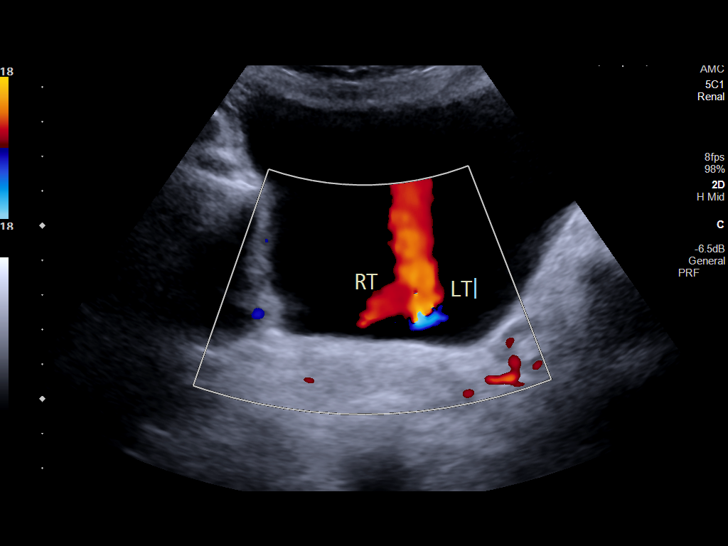
[im 37/37]
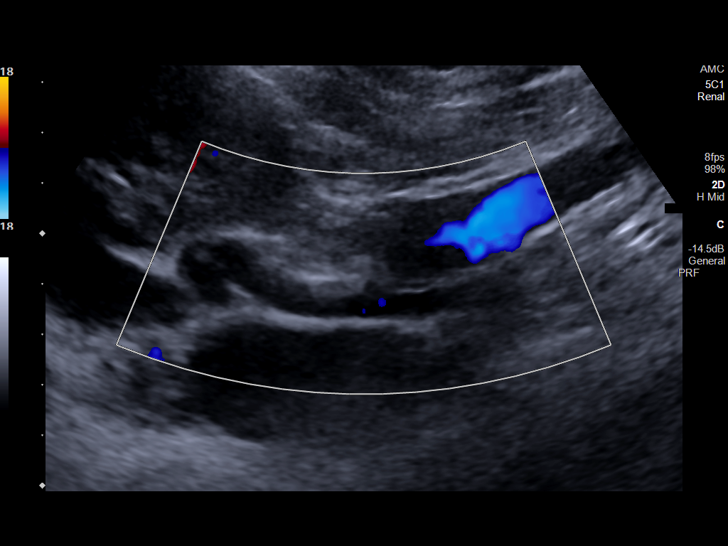

[Series 1001: renal · 1 of 3 slices shown]
[im 3/3]
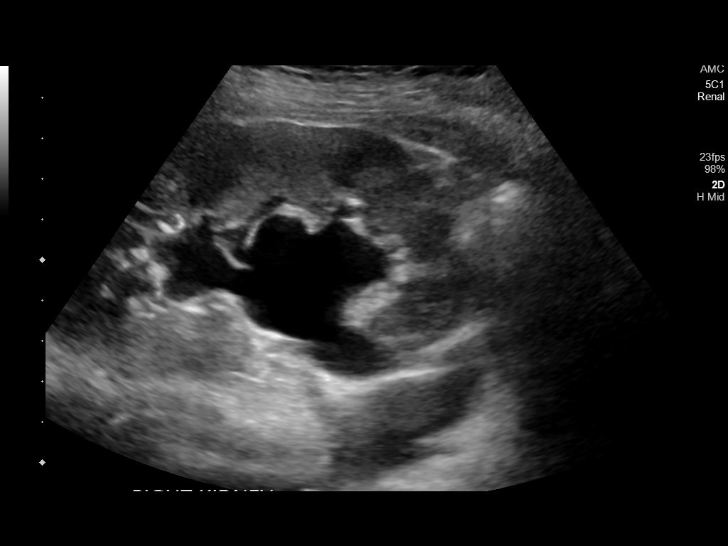

[13 of 25 positions shown; findings below may reference images not displayed]

FINDINGS: Right Kidney:

Renal measurements: 10.7 x 6.0 x 5.5 cm = volume: 185 mL .
Echogenicity and renal cortical thickness are within normal limits.
No mass or perinephric fluid visualized. There is moderate
hydronephrosis and proximal ureterectasis on the right. Bowel
obstructs mid to distal ureter on the right. No sonographically
demonstrable calculus evident on the right.

Left Kidney:

Renal measurements: 10.6 x 5.7 x 5.8 cm = volume: 184 mL.
Echogenicity and renal cortical thickness are within normal limits.
No mass or perinephric fluid visualized. There is slight fullness of
the left renal collecting system. No evident ureterectasis or
ureteral calculus.

Bladder:

Appears normal for degree of bladder distention. Flow from the left
ureter is seen. On the slight flow from the right ureter seen.

Other:

None.
IMPRESSION: 1. Moderate hydronephrosis and proximal ureterectasis on the right.
Bowel obscures mid to distal right ureter. Limited flow from right
ureter seen in the bladder by Doppler assessment. Note that a
calculus in the mid to distal right ureter has not been excluded
with this study.

2. Slight fullness of the left renal collecting system with flow
from the distal left ureter seen within the bladder.

3.  Study otherwise unremarkable.

These results will be called to the ordering clinician or
representative by the Radiologist Assistant, and communication
documented in the PACS or [REDACTED].

## 2019-11-21 MED ORDER — CALCIUM CARBONATE ANTACID 500 MG PO CHEW: 2.0000 | CHEWABLE_TABLET | ORAL | Status: DC | PRN

## 2019-11-21 MED ORDER — ACETAMINOPHEN 325 MG PO TABS
650.0000 mg | ORAL_TABLET | ORAL | Status: DC | PRN
  Administered 2019-11-21: 650 mg via ORAL
  Filled 2019-11-21: qty 2

## 2019-11-21 MED ORDER — FLUCONAZOLE 100 MG PO TABS
100.0000 mg | ORAL_TABLET | Freq: Once | ORAL | 0 refills | Status: AC
Start: 2019-11-21 — End: 2019-11-21

## 2019-11-21 NOTE — OB Triage Note (Signed)
Sent over from office for lab work and monitoring due to lower back pain and lower abdominal pain.

## 2019-11-21 NOTE — Discharge Instructions (Signed)
Braxton Hicks Contractions Contractions of the uterus can occur throughout pregnancy, but they are not always a sign that you are in labor. You may have practice contractions called Braxton Hicks contractions. These false labor contractions are sometimes confused with true labor. What are Braxton Hicks contractions? Braxton Hicks contractions are tightening movements that occur in the muscles of the uterus before labor. Unlike true labor contractions, these contractions do not result in opening (dilation) and thinning of the cervix. Toward the end of pregnancy (32-34 weeks), Braxton Hicks contractions can happen more often and may become stronger. These contractions are sometimes difficult to tell apart from true labor because they can be very uncomfortable. You should not feel embarrassed if you go to the hospital with false labor. Sometimes, the only way to tell if you are in true labor is for your health care provider to look for changes in the cervix. The health care provider will do a physical exam and may monitor your contractions. If you are not in true labor, the exam should show that your cervix is not dilating and your water has not broken. If there are no other health problems associated with your pregnancy, it is completely safe for you to be sent home with false labor. You may continue to have Braxton Hicks contractions until you go into true labor. How to tell the difference between true labor and false labor True labor  Contractions last 30-70 seconds.  Contractions become very regular.  Discomfort is usually felt in the top of the uterus, and it spreads to the lower abdomen and low back.  Contractions do not go away with walking.  Contractions usually become more intense and increase in frequency.  The cervix dilates and gets thinner. False labor  Contractions are usually shorter and not as strong as true labor contractions.  Contractions are usually irregular.  Contractions  are often felt in the front of the lower abdomen and in the groin.  Contractions may go away when you walk around or change positions while lying down.  Contractions get weaker and are shorter-lasting as time goes on.  The cervix usually does not dilate or become thin. Follow these instructions at home:   Take over-the-counter and prescription medicines only as told by your health care provider.  Keep up with your usual exercises and follow other instructions from your health care provider.  Eat and drink lightly if you think you are going into labor.  If Braxton Hicks contractions are making you uncomfortable: ? Change your position from lying down or resting to walking, or change from walking to resting. ? Sit and rest in a tub of warm water. ? Drink enough fluid to keep your urine pale yellow. Dehydration may cause these contractions. ? Do slow and deep breathing several times an hour.  Keep all follow-up prenatal visits as told by your health care provider. This is important. Contact a health care provider if:  You have a fever.  You have continuous pain in your abdomen. Get help right away if:  Your contractions become stronger, more regular, and closer together.  You have fluid leaking or gushing from your vagina.  You pass blood-tinged mucus (bloody show).  You have bleeding from your vagina.  You have low back pain that you never had before.  You feel your baby's head pushing down and causing pelvic pressure.  Your baby is not moving inside you as much as it used to. Summary  Contractions that occur before labor are   are called Braxton Hicks contractions, false labor, or practice contractions.  Braxton Hicks contractions are usually shorter, weaker, farther apart, and less regular than true labor contractions. True labor contractions usually become progressively stronger and regular, and they become more frequent.  Manage discomfort from  [facility] contractions by changing position, resting in a warm bath, drinking plenty of water, or practicing deep breathing. This information is not intended to replace advice given to you by your health care provider. Make sure you discuss any questions you have with your health care provider. Document Revised: 04/16/2017 Document Reviewed: 09/17/2016 Elsevier Patient Education  2020 [employer]   Urinary Tract Infection, Adult  A urinary tract infection (UTI) is an infection of any part of the urinary tract. The urinary tract includes the kidneys, ureters, bladder, and urethra. These organs make, store, and get rid of urine in the body. Your health care provider may use other names to describe the infection. An upper UTI affects the ureters and kidneys (pyelonephritis). A lower UTI affects the bladder (cystitis) and urethra (urethritis). What are the causes? Most urinary tract infections are caused by bacteria in your genital area, around the entrance to your urinary tract (urethra). These bacteria grow and cause inflammation of your urinary tract. What increases the risk? You are more likely to develop this condition if:  You have a urinary catheter that stays in place (indwelling).  You are not able to control when you urinate or have a bowel movement (you have incontinence).  You are female and you: ? Use a spermicide or diaphragm for birth control. ? Have low estrogen levels. ? Are pregnant.  You have certain genes that increase your risk (genetics).  You are sexually active.  You take antibiotic medicines.  You have a condition that causes your flow of urine to slow down, such as: ? An enlarged prostate, if you are female. ? Blockage in your urethra (stricture). ? A kidney stone. ? A nerve condition that affects your bladder control (neurogenic bladder). ? Not getting enough to drink, or not urinating often.  You have certain medical conditions, such  as: ? Diabetes. ? A weak disease-fighting system (immunesystem). ? Sickle cell disease. ? Gout. ? Spinal cord injury. What are the signs or symptoms? Symptoms of this condition include:  Needing to urinate right away (urgently).  Frequent urination or passing small amounts of urine frequently.  Pain or burning with urination.  Blood in the urine.  Urine that smells bad or unusual.  Trouble urinating.  Cloudy urine.  Vaginal discharge, if you are female.  Pain in the abdomen or the lower back. You may also have:  Vomiting or a decreased appetite.  Confusion.  Irritability or tiredness.  A fever.  Diarrhea. The first symptom in older adults may be confusion. In some cases, they may not have any symptoms until the infection has worsened. How is this diagnosed? This condition is diagnosed based on your medical history and a physical exam. You may also have other tests, including:  Urine tests.  Blood tests.  Tests for sexually transmitted infections (STIs). If you have had more than one UTI, a cystoscopy or imaging studies may be done to determine the cause of the infections. How is this treated? Treatment for this condition includes:  Antibiotic medicine.  Over-the-counter medicines to treat discomfort.  Drinking enough water to stay hydrated. If you have frequent infections or have other conditions such as a kidney stone, you may need to see a  health care provider who specializes in the urinary tract (urologist). In rare cases, urinary tract infections can cause sepsis. Sepsis is a life-threatening condition that occurs when the body responds to an infection. Sepsis is treated in the hospital with IV antibiotics, fluids, and other medicines. Follow these instructions at home:  Medicines  Take over-the-counter and prescription medicines only as told by your health care provider.  If you were prescribed an antibiotic medicine, take it as told by your health  care provider. Do not stop using the antibiotic even if you start to feel better. General instructions  Make sure you: ? Empty your bladder often and completely. Do not hold urine for long periods of time. ? Empty your bladder after sex. ? Wipe from front to back after a bowel movement if you are female. Use each tissue one time when you wipe.  Drink enough fluid to keep your urine pale yellow.  Keep all follow-up visits as told by your health care provider. This is important. Contact a health care provider if:  Your symptoms do not get better after 1-2 days.  Your symptoms go away and then return. Get help right away if you have:  Severe pain in your back or your lower abdomen.  A fever.  Nausea or vomiting. Summary  A urinary tract infection (UTI) is an infection of any part of the urinary tract, which includes the kidneys, ureters, bladder, and urethra.  Most urinary tract infections are caused by bacteria in your genital area, around the entrance to your urinary tract (urethra).  Treatment for this condition often includes antibiotic medicines.  If you were prescribed an antibiotic medicine, take it as told by your health care provider. Do not stop using the antibiotic even if you start to feel better.  Keep all follow-up visits as told by your health care provider. This is important. This information is not intended to replace advice given to you by your health care provider. Make sure you discuss any questions you have with your health care provider. Document Revised: 04/21/2018 Document Reviewed: 11/11/2017 Elsevier Patient Education  2020 [employer] Kidney Stones Kidney stones are rock-like masses that form inside of the kidneys. Kidneys are organs that make pee (urine). A kidney stone may move into other parts of the urinary tract, including:  The tubes that connect the kidneys to the bladder (ureters).  The bladder.  The tube that carries urine out of the  body (urethra). Kidney stones can cause very bad pain and can block the flow of pee. The stone usually leaves your body (passes) through your pee. You may need to have a doctor take out the stone. What are the causes? Kidney stones may be caused by:  A condition in which certain glands make too much parathyroid hormone (primary hyperparathyroidism).  A buildup of a type of crystals in the bladder made of a chemical called uric acid. The body makes uric acid when you eat certain foods.  Narrowing (stricture) of one or both of the ureters.  A kidney blockage that you were born with.  Past surgery on the kidney or the ureters, such as gastric bypass surgery. What increases the risk? You are more likely to develop this condition if:  You have had a kidney stone in the past.  You have a family history of kidney stones.  You do not drink enough water.  You eat a diet that is high in protein, salt (sodium), or sugar.  You are overweight or  very overweight (obese). What are the signs or symptoms? Symptoms of a kidney stone may include:  Pain in the side of the belly, right below the ribs (flank pain). Pain usually spreads (radiates) to the groin.  Needing to pee often or right away (urgently).  Pain when going pee (urinating).  Blood in your pee (hematuria).  Feeling like you may vomit (nauseous).  Vomiting.  Fever and chills. How is this treated? Treatment depends on the size, location, and makeup of the kidney stones. The stones will often pass out of the body through peeing. You may need to:  Drink more fluid to help pass the stone. In some cases, you may be given fluids through an IV tube put into one of your veins at the hospital.  Take medicine for pain.  Make changes in your diet to help keep kidney stones from coming back. Sometimes, medical procedures are needed to remove a kidney stone. This may involve:  A procedure to break up kidney stones using a beam of  light (laser) or shock waves.  Surgery to remove the kidney stones. Follow these instructions at home: Medicines  Take over-the-counter and prescription medicines only as told by your doctor.  Ask your doctor if the medicine prescribed to you requires you to avoid driving or using heavy machinery. Eating and drinking  Drink enough fluid to keep your pee pale yellow. You may be told to drink at least 8-10 glasses of water each day. This will help you pass the stone.  If told by your doctor, change your diet. This may include: ? Limiting how much salt you eat. ? Eating more fruits and vegetables. ? Limiting how much meat, poultry, fish, and eggs you eat.  Follow instructions from your doctor about eating or drinking restrictions. General instructions  Collect pee samples as told by your doctor. You may need to collect a pee sample: ? 24 hours after a stone comes out. ? 8-12 weeks after a stone comes out, and every 6-12 months after that.  Strain your pee every time you pee (urinate), for as long as told. Use the strainer that your doctor recommends.  Do not throw out the stone. Keep it so that it can be tested by your doctor.  Keep all follow-up visits as told by your doctor. This is important. You may need follow-up tests. How is this prevented? To prevent another kidney stone:  Drink enough fluid to keep your pee pale yellow. This is the best way to prevent kidney stones.  Eat healthy foods.  Avoid certain foods as told by your doctor. You may be told to eat less protein.  Stay at a healthy weight. Where to find more information  National Kidney Foundation (NKF): www.kidney.org  [facility]): www.urologyhealth.org Contact a doctor if:  You have pain that gets worse or does not get better with medicine. Get help right away if:  You have a fever or chills.  You get very bad pain.  You get new pain in your belly (abdomen).  You pass out  (faint).  You cannot pee. Summary  Kidney stones are rock-like masses that form inside of the kidneys.  Kidney stones can cause very bad pain and can block the flow of pee.  The stones will often pass out of the body through peeing.  Drink enough fluid to keep your pee pale yellow. This information is not intended to replace advice given to you by your health care provider. Make sure  you discuss any questions you have with your health care provider. Document Revised: 09/20/2018 Document Reviewed: 09/20/2018 Elsevier Patient Education  2020 [employer].

## 2019-11-21 NOTE — Progress Notes (Signed)
Pt. Given discharge instructions. DC'ed ambulatory.

## 2019-11-21 NOTE — Discharge Summary (Signed)
Tiffany Whitehead is a 29 y.o. female. She is at [redacted]w[redacted]d gestation. Patient's last menstrual period was 04/01/2019. Estimated Date of Delivery: 02/09/20  Prenatal care site: [facility]   Chief complaint: sent from office for evaluation of pyelonephritis versus kidney stone.    Positive urine culture in office last week for E.Coli - treated with abx.  Positive for yeast on UA today.  Reports continued lower back pain, particularly right flank pain.  Also reports fever on Sunday that was treated with Tylenol.    S: Resting comfortably. no CTX, no VB.no LOF,  Active fetal movement.   Maternal Medical History:  Past Medical Hx:  has a past medical history of Anxiety, Chronic kidney disease, Depression, H/O borderline personality disorder, and UTI (urinary tract infection).    Past Surgical Hx:  has a past surgical history that includes extraction of wisdom teeth; Colonoscopy; and Appendectomy.   Allergies  Allergen Reactions  . Ceclor [Cefaclor] Rash     Prior to Admission medications   Medication Sig Start Date End Date Taking? Authorizing Provider  acetaminophen (TYLENOL) 325 MG tablet Take 2 tablets (650 mg total) by mouth every 6 (six) hours as needed for mild pain or moderate pain. 11/24/17  Yes [name], CNM  busPIRone (BUSPAR) 5 MG tablet Take 5 mg by mouth 2 (two) times daily. 09/04/19  Yes [provider]  ondansetron (ZOFRAN-ODT) 8 MG disintegrating tablet Take 1 tablet (8 mg total) by mouth every 8 (eight) hours. 09/24/19  Yes [name], CNM  ondansetron (ZOFRAN-ODT) 8 MG disintegrating tablet Take 1 tablet (8 mg total) by mouth every 8 (eight) hours. 09/24/19  Yes [name], CNM  Prenatal Vit-Fe Fumarate-FA (MULTIVITAMIN-PRENATAL) 27-0.8 MG TABS tablet Take 1 tablet by mouth daily.   Yes [provider]  promethazine (PHENERGAN) 25 MG suppository Place 25 mg rectally every 6 (six) hours as needed. 07/27/19  Yes [provider]   promethazine (PHENERGAN) 25 MG tablet Take 1 tablet (25 mg total) by mouth every 6 (six) hours. 09/24/19  Yes [name], CNM  sertraline (ZOLOFT) 100 MG tablet Take 100 mg by mouth at bedtime. 09/04/19  Yes [provider]  metoCLOPramide (REGLAN) 5 MG/5ML solution Take 10 mLs (10 mg total) by mouth every 8 (eight) hours. Patient not taking: Reported on 11/21/2019 09/24/19   [name], CNM  scopolamine (TRANSDERM-SCOP) 1 MG/3DAYS Place 1 patch onto the skin every 3 (three) days. Patient not taking: Reported on 11/21/2019 09/07/19   [provider]    Social History: She  reports that she has never smoked. She has never used smokeless tobacco. She reports previous alcohol use. She reports that she does not use drugs.  Family History: family history includes Cancer in her mother and another family member.  no history of gyn cancers  Review of Systems: A full review of systems was performed and negative except as noted in the HPI.    O:  Temp 98.8 F (37.1 C) (Oral)   LMP 04/01/2019  Results for orders placed or performed during the hospital encounter of 11/21/19 (from the past 48 hour(s))  CBC   Collection Time: 11/21/19 11:46 AM  Result Value Ref Range   WBC 10.3 4.0 - 10.5 K/uL   RBC 3.20 (L) 3.87 - 5.11 MIL/uL   Hemoglobin 9.9 (L) 12.0 - 15.0 g/dL   HCT 02.4 (L) 36 - 46 %   MCV 90.3 80.0 - 100.0 fL   MCH 30.9 26.0 - 34.0  pg   MCHC 34.3 30.0 - 36.0 g/dL   RDW 16.1 09.6 - 04.5 %   Platelets 182 150 - 400 K/uL   nRBC 0.0 0.0 - 0.2 %  Comprehensive metabolic panel   Collection Time: 11/21/19 11:46 AM  Result Value Ref Range   Sodium 135 135 - 145 mmol/L   Potassium 3.8 3.5 - 5.1 mmol/L   Chloride 105 98 - 111 mmol/L   CO2 23 22 - 32 mmol/L   Glucose, Bld 98 70 - 99 mg/dL   BUN 7 6 - 20 mg/dL   Creatinine, Ser 4.09 0.44 - 1.00 mg/dL   Calcium 8.6 (L) 8.9 - 10.3 mg/dL   Total Protein 6.0 (L) 6.5 - 8.1 g/dL   Albumin 2.8 (L) 3.5 - 5.0 g/dL   AST 17  15 - 41 U/L   ALT 10 0 - 44 U/L   Alkaline Phosphatase 70 38 - 126 U/L   Total Bilirubin 0.5 0.3 - 1.2 mg/dL   GFR calc non Af Amer >60 >60 mL/min   GFR calc Af Amer >60 >60 mL/min   Anion gap 7 5 - 15      No current facility-administered medications on file prior to encounter.   Current Outpatient Medications on File Prior to Encounter  Medication Sig Dispense Refill  . acetaminophen (TYLENOL) 325 MG tablet Take 2 tablets (650 mg total) by mouth every 6 (six) hours as needed for mild pain or moderate pain. 90 tablet 0  . busPIRone (BUSPAR) 5 MG tablet Take 5 mg by mouth 2 (two) times daily.    . ondansetron (ZOFRAN-ODT) 8 MG disintegrating tablet Take 1 tablet (8 mg total) by mouth every 8 (eight) hours. 90 tablet 1  . ondansetron (ZOFRAN-ODT) 8 MG disintegrating tablet Take 1 tablet (8 mg total) by mouth every 8 (eight) hours. 90 tablet 1  . Prenatal Vit-Fe Fumarate-FA (MULTIVITAMIN-PRENATAL) 27-0.8 MG TABS tablet Take 1 tablet by mouth daily.    . promethazine (PHENERGAN) 25 MG suppository Place 25 mg rectally every 6 (six) hours as needed.    . promethazine (PHENERGAN) 25 MG tablet Take 1 tablet (25 mg total) by mouth every 6 (six) hours. 120 tablet 0  . sertraline (ZOLOFT) 100 MG tablet Take 100 mg by mouth at bedtime.    Marland Kitchen scopolamine (TRANSDERM-SCOP) 1 MG/3DAYS Place 1 patch onto the skin every 3 (three) days. (Patient not taking: Reported on 11/21/2019)     Allergies  Allergen Reactions  . Ceclor [Cefaclor] Rash    IMAGING US OB Limited  Result Date: 11/21/2019 CLINICAL DATA:  Cervical length EXAM: LIMITED OBSTETRIC ULTRASOUND FINDINGS: Number of Fetuses: 1 Heart Rate:  130 bpm Movement: Yes Presentation: Breech/transverse, head right Placental Location: Posterior Previa: No Amniotic Fluid (Subjective):  Within normal limits. AFI: 13.1 cm BPD: 7.3 cm 29 w  1 d MATERNAL FINDINGS: Cervix:  Appears closed.  Cervical length 4.3 cm. Uterus/Adnexae: No abnormality visualized.  IMPRESSION: 1. Single intrauterine gestation in breech/transverse lie, head right. Sonographic gestational age [redacted] weeks, 1 day. Fetal heart rate 130 bpm. 2.  Closed cervix.  Cervical length 4.3 cm. This exam is performed on an emergent basis and does not comprehensively evaluate fetal size, dating, or anatomy; follow-up complete OB US should be considered if further fetal assessment is warranted. Electronically Signed   By: Lauralyn Primes M.D.   On: 11/21/2019 13:21   US RENAL  Result Date: 11/21/2019 CLINICAL DATA:  Right flank region tenderness. Third trimester gestation EXAM:  RENAL / URINARY TRACT ULTRASOUND COMPLETE COMPARISON:  None. FINDINGS: Right Kidney: Renal measurements: 10.7 x 6.0 x 5.5 cm = volume: 185 mL . Echogenicity and renal cortical thickness are within normal limits. No mass or perinephric fluid visualized. There is moderate hydronephrosis and proximal ureterectasis on the right. Bowel obstructs mid to distal ureter on the right. No sonographically demonstrable calculus evident on the right. Left Kidney: Renal measurements: 10.6 x 5.7 x 5.8 cm = volume: 184 mL. Echogenicity and renal cortical thickness are within normal limits. No mass or perinephric fluid visualized. There is slight fullness of the left renal collecting system. No evident ureterectasis or ureteral calculus. Bladder: Appears normal for degree of bladder distention. Flow from the left ureter is seen. On the slight flow from the right ureter seen. Other: None. IMPRESSION: 1. Moderate hydronephrosis and proximal ureterectasis on the right. Bowel obscures mid to distal right ureter. Limited flow from right ureter seen in the bladder by Doppler assessment. Note that a calculus in the mid to distal right ureter has not been excluded with this study. 2. Slight fullness of the left renal collecting system with flow from the distal left ureter seen within the bladder. 3.  Study otherwise unremarkable. These results will be called to the  ordering clinician or representative by the Radiologist Assistant, and communication documented in the PACS or [employer] Dashboard. Electronically Signed   By: Bretta BangWilliam  Woodruff III M.D.   On: 11/21/2019 13:16   US MFM OB Limited  Result Date: 10/22/2019 ----------------------------------------------------------------------  OBSTETRICS REPORT                       (Signed Final 10/22/2019 08:02 pm) ---------------------------------------------------------------------- Patient Info  ID #:       [id number]                          D.O.B.:  01-16-1991 (29 yrs)  Name:       Ardelle ParkMORGAN E Grealish                  Visit Date: 10/22/2019 07:08 pm ---------------------------------------------------------------------- Performed By  Attending:        Ma RingsVictor Fang MD         Secondary Phy.:    Armando ReichertHEATHER D                                                              HOGAN CNM  Performed By:     Birdena CrandallYasemin Karatas        Address:           [address]                    [place]                                                              [place] [address]  Referred By:      [facility] MA[facility]/Triage         [facility] ---------------------------------------------------------------------- Orders  #  Description                           Code        Ordered By  1  [place] MFM OB LIMITED                     [phone]    HEATHER HOGAN ----------------------------------------------------------------------  #  Order #                     Accession #                Episode #  1  [id number]                   [id number]                 [id number] ---------------------------------------------------------------------- Indications  Vaginal bleeding in pregnancy, second           O46.92  trimester  Traumatic injury during pregnancy (fall         O9A.219 T14.90  yesterday)  [redacted] weeks gestation of pregnancy                  Z3A.24 ---------------------------------------------------------------------- Fetal Evaluation  Num Of Fetuses:          1  Fetal Heart Rate(bpm):   142  Cardiac Activity:        Observed  Presentation:            Breech  Placenta:                Posterior  P. Cord Insertion:       Visualized, central  Amniotic Fluid  AFI FV:      Within normal limits                              Largest Pocket(cm)                              6.1  Comment:    No placental abruption or previa identified. ---------------------------------------------------------------------- OB History  Gravidity:    3         Term:   1         SAB:   1  Living:       1 ---------------------------------------------------------------------- Gestational Age  LMP:           24w 2d        Date:  05/05/19                 EDD:   02/09/20  Best:          Darien Ramus 2d     Det. By:  LMP  (05/05/19)  EDD:   02/09/20 ---------------------------------------------------------------------- Anatomy  Cranium:               Appears normal         Aortic Arch:            Appears normal  Cavum:                 Appears normal         Diaphragm:              Appears normal  Ventricles:            Appears normal         Stomach:                Appears normal, left                                                                        sided  Choroid Plexus:        Appears normal         Abdomen:                Appears normal  Cerebellum:            Appears normal         Abdominal Wall:         Appears nml (cord                                                                        insert, abd wall)  Posterior Fossa:       Appears normal         Cord Vessels:           Appears normal (3                                                                        vessel cord)  Heart:                 Appears normal         Kidneys:                Appear normal                         (4CH, axis, and                         situs)  RVOT:                  Appears  normal         Bladder:  Appears normal  LVOT:                  Appears normal ---------------------------------------------------------------------- Cervix Uterus Adnexa  Cervix  Length:           4.57  cm.  Normal appearance by transabdominal scan.  Uterus  No abnormality visualized.  Right Ovary  No adnexal mass visualized.  Left Ovary  No adnexal mass visualized.  Cul De Sac  No free fluid seen.  Adnexa  No adnexal mass visualized. ---------------------------------------------------------------------- Comments  A limited ultrasound performed today shows that the fetus is  in the breech presentation.  There was normal amniotic fluid noted. ----------------------------------------------------------------------                   Ma Rings, MD Electronically Signed Final Report   10/22/2019 08:02 pm ----------------------------------------------------------------------   Constitutional: NAD, AAOx3  HE/ENT: extraocular movements grossly intact, moist mucous membranes CV: RRR PULM: nl respiratory effort, CTABL     Abd: gravid, non-tender, non-distended, soft      Ext: Non-tender, Nonedmeatous   Psych: mood appropriate, speech normal Pelvic deferred   NST:  Baseline: 145 Variability: moderate Accelerations present x >2 Decelerations absent Toco: quiet  Time   Assessment: 29 y.o. [redacted]w[redacted]d here for antenatal surveillance during pregnancy.  Principle diagnosis: Recent UTI in pregnancy, vaginal yeast infection, flank pain in pregnancy   Plan:  Labor: not present.    Cervical length reassuring at 4.03 cm without funneling  Fetal Wellbeing: Reassuring Cat 1 tracing.  Reactive NST   Cannot rule out kidney stone.  Normal physiologic changes noted in right kidney but unable to rule out kidney stone in the mid to distal right ureter.    Reviewed precautions and warning signs.  Encouraged good hydration to help with kidneys.    UA and Urine culture from office pending.     Positive for yeast infection - treated with diflucan   D/c home stable, precautions reviewed, follow-up as scheduled.   ----- Margaretmary Eddy, CNM Certified Nurse Midwife [facility]

## 2019-11-21 NOTE — Progress Notes (Signed)
Pt. Back in unit from Ultrasound. CNM Tami Lin states no need for EFM on return. Pt. In room eating lunch waiting on results from [place]

## 2019-12-10 ENCOUNTER — Other Ambulatory Visit: Payer: Self-pay

## 2019-12-10 ENCOUNTER — Encounter: Payer: Self-pay | Admitting: Obstetrics and Gynecology

## 2019-12-10 ENCOUNTER — Observation Stay
Admission: EM | Admit: 2019-12-10 | Discharge: 2019-12-10 | Disposition: A | Discharge status: Home / Self Care | Payer: BC Managed Care – PPO | Attending: Obstetrics and Gynecology | Admitting: Obstetrics and Gynecology

## 2019-12-10 DIAGNOSIS — F419 Anxiety disorder, unspecified: Secondary | ICD-10-CM | POA: Insufficient documentation

## 2019-12-10 DIAGNOSIS — O99340 Other mental disorders complicating pregnancy, unspecified trimester: Secondary | ICD-10-CM | POA: Insufficient documentation

## 2019-12-10 DIAGNOSIS — O23599 Infection of other part of genital tract in pregnancy, unspecified trimester: Secondary | ICD-10-CM | POA: Insufficient documentation

## 2019-12-10 DIAGNOSIS — Z0371 Encounter for suspected problem with amniotic cavity and membrane ruled out: Principal | ICD-10-CM | POA: Insufficient documentation

## 2019-12-10 DIAGNOSIS — O26833 Pregnancy related renal disease, third trimester: Secondary | ICD-10-CM | POA: Diagnosis not present

## 2019-12-10 DIAGNOSIS — N189 Chronic kidney disease, unspecified: Secondary | ICD-10-CM | POA: Diagnosis not present

## 2019-12-10 DIAGNOSIS — Z87440 Personal history of urinary (tract) infections: Secondary | ICD-10-CM | POA: Diagnosis not present

## 2019-12-10 DIAGNOSIS — Z3A31 31 weeks gestation of pregnancy: Secondary | ICD-10-CM | POA: Diagnosis not present

## 2019-12-10 DIAGNOSIS — Z79899 Other long term (current) drug therapy: Secondary | ICD-10-CM | POA: Diagnosis not present

## 2019-12-10 LAB — URINALYSIS, COMPLETE (UACMP) WITH MICROSCOPIC
Bilirubin Urine: NEGATIVE
Glucose, UA: NEGATIVE mg/dL
Hgb urine dipstick: NEGATIVE
Ketones, ur: NEGATIVE mg/dL
Nitrite: NEGATIVE
Protein, ur: NEGATIVE mg/dL
Specific Gravity, Urine: 1.016 (ref 1.005–1.030)
pH: 7 (ref 5.0–8.0)

## 2019-12-10 LAB — WET PREP, GENITAL
Clue Cells Wet Prep HPF POC: NONE SEEN
Sperm: NONE SEEN
Trich, Wet Prep: NONE SEEN

## 2019-12-10 MED ORDER — FLUCONAZOLE 150 MG PO TABS
ORAL_TABLET | ORAL | 1 refills | Status: DC
Start: 2019-12-10 — End: 2020-01-06

## 2019-12-10 NOTE — Discharge Summary (Signed)
Tiffany Whitehead is a 29 y.o. female. She is at [redacted]w[redacted]d gestation. Patient's last menstrual period was 04/01/2019. Estimated Date of Delivery: 02/09/20  Prenatal care site: [facility]   Current pregnancy complicated by:  1. ADHD, no meds currently 2. Generalized anxiety: sees counselor, taking Zoloft 100mg  daily and Buspar 5mg  BID 3. History of postpartum depression 4. Bicornuate uterus 5. Hyperemesis gravidarum  Chief complaint: Irregular cramping q8-42min that got worse after taking a long walk with her toddler. Noted LOF while walking with saturated underwear. Changed underwear and put on a dry pad and noted no further leaking. Denies VB, and notes fetus is very active.   S: Resting comfortably. no CTX, no VB.no LOF,  Active fetal movement. Denies: HA, visual changes, SOB, or RUQ/epigastric pain  Maternal Medical History:   Past Medical History:  Diagnosis Date  . Anxiety   . Chronic kidney disease   . Depression   . H/O borderline personality disorder    stopped meds at beginning of pregnancy, Lamictal and Vyvanse  . UTI (urinary tract infection)     Past Surgical History:  Procedure Laterality Date  . APPENDECTOMY    . COLONOSCOPY    . extraction of wisdom teeth      Allergies  Allergen Reactions  . Ceclor [Cefaclor] Rash    Prior to Admission medications   Medication Sig Start Date End Date Taking? Authorizing Provider  acetaminophen (TYLENOL) 325 MG tablet Take 2 tablets (650 mg total) by mouth every 6 (six) hours as needed for mild pain or moderate pain. 11/24/17  Yes 9m, CNM  busPIRone (BUSPAR) 5 MG tablet Take 5 mg by mouth 2 (two) times daily. 09/04/19  Yes [provider]  cetirizine (ZYRTEC) 10 MG tablet Take 10 mg by mouth daily.   Yes [provider]  ondansetron (ZOFRAN-ODT) 8 MG disintegrating tablet Take 1 tablet (8 mg total) by mouth every 8 (eight) hours. 09/24/19  Yes 09/06/19, CNM  ondansetron  (ZOFRAN-ODT) 8 MG disintegrating tablet Take 1 tablet (8 mg total) by mouth every 8 (eight) hours. 09/24/19  Yes [name], CNM  Prenatal Vit-Fe Fumarate-FA (MULTIVITAMIN-PRENATAL) 27-0.8 MG TABS tablet Take 1 tablet by mouth daily.   Yes [provider]  promethazine (PHENERGAN) 25 MG suppository Place 25 mg rectally every 6 (six) hours as needed. 07/27/19  Yes [provider]  promethazine (PHENERGAN) 25 MG tablet Take 1 tablet (25 mg total) by mouth every 6 (six) hours. 09/24/19  Yes 09/26/19, CNM  sertraline (ZOLOFT) 100 MG tablet Take 100 mg by mouth at bedtime. 09/04/19  Yes [provider]  fluconazole (DIFLUCAN) 150 MG tablet Can take additional dose three days later if symptoms persist 12/10/19   Delesa Kawa, 09/06/19 A, CNM  scopolamine (TRANSDERM-SCOP) 1 MG/3DAYS Place 1 patch onto the skin every 3 (three) days. Patient not taking: Reported on 11/21/2019 09/07/19   [provider]      Social History: She  reports that she has never smoked. She has never used smokeless tobacco. She reports previous alcohol use. She reports that she does not use drugs.  Family History: family history includes Cancer in her mother and another family member.   Review of Systems: A full review of systems was performed and negative except as noted in the HPI.     O:  BP 98/65 (BP Location: Left Arm)   Pulse 74   Temp 98.4 F (36.9 C) (Oral)   Resp 18   Ht  5\' 8"  (1.727 m)   Wt 78.9 kg   LMP 04/01/2019   BMI 26.46 kg/m  Results for orders placed or performed during the hospital encounter of 12/10/19 (from the past 48 hour(s))  Wet prep, genital   Collection Time: 12/10/19 12:24 PM   Specimen: Urine, Clean Catch  Result Value Ref Range   Yeast Wet Prep HPF POC PRESENT (A) NONE SEEN   Trich, Wet Prep NONE SEEN NONE SEEN   Clue Cells Wet Prep HPF POC NONE SEEN NONE SEEN   WBC, Wet Prep HPF POC MANY (A) NONE SEEN   Sperm NONE SEEN   Urinalysis, Complete w  Microscopic   Collection Time: 12/10/19 12:24 PM  Result Value Ref Range   Color, Urine YELLOW (A) YELLOW   APPearance HAZY (A) CLEAR   Specific Gravity, Urine 1.016 1.005 - 1.030   pH 7.0 5.0 - 8.0   Glucose, UA NEGATIVE NEGATIVE mg/dL   Hgb urine dipstick NEGATIVE NEGATIVE   Bilirubin Urine NEGATIVE NEGATIVE   Ketones, ur NEGATIVE NEGATIVE mg/dL   Protein, ur NEGATIVE NEGATIVE mg/dL   Nitrite NEGATIVE NEGATIVE   Leukocytes,Ua MODERATE (A) NEGATIVE   RBC / HPF 0-5 0 - 5 RBC/hpf   WBC, UA 0-5 0 - 5 WBC/hpf   Bacteria, UA RARE (A) NONE SEEN   Squamous Epithelial / LPF 0-5 0 - 5     Constitutional: NAD, AAOx3  HE/ENT: extraocular movements grossly intact, moist mucous membranes CV: RRR PULM: nl respiratory effort, CTABL     Abd: gravid, non-tender, non-distended, soft      Ext: Non-tender, Nonedematous   Psych: mood appropriate, speech normal Pelvic: SSE done- mod amt white DC, mild erythema, visually closed cervix.   Fetal  monitoring: Cat I Appropriate for GA Baseline:  Variability: moderate Accelerations: absent/ present x >2 Decelerations absent  TOCO: no UCs    A/P: 29 y.o. [redacted]w[redacted]d here for antenatal surveillance for vaginal discharge, suspected LOF.   Principle Diagnosis:  Vaginal yeast infection, 31wks   Preterm labor: not present.   Fetal Wellbeing: Reassuring Cat 1 tracing with Reactive NST   Vaginal yeast infection, Rx Diflucan to pharmacy.   D/c home stable, precautions reviewed, follow-up as scheduled.    [redacted]w[redacted]d Elishah Ashmore, CNM 12/10/2019  1:20 PM

## 2019-12-10 NOTE — OB Triage Note (Signed)
Pt. discharged home in stable condition. Follow up care and medications discussed with pt- pt. verbalized understanding. Patient stable at this time.

## 2019-12-10 NOTE — OB Triage Note (Signed)
Pt. presented to L/D triage with reported contractions that began at 0845 while on a "rigorous walk". She describes the pain as intermittent, rated 7/10. She also reports noticing clear LOF at 0845 with saturated underwear. None noted at hospital. She reports positive fetal movement with no bleeding. VSS. Monitors applied and assessing.

## 2019-12-22 ENCOUNTER — Other Ambulatory Visit: Payer: Self-pay

## 2019-12-22 ENCOUNTER — Observation Stay
Admission: EM | Admit: 2019-12-22 | Discharge: 2019-12-22 | Disposition: A | Discharge status: Home / Self Care | Payer: 59 | Attending: Obstetrics and Gynecology | Admitting: Obstetrics and Gynecology

## 2019-12-22 ENCOUNTER — Encounter: Payer: Self-pay | Admitting: Obstetrics and Gynecology

## 2019-12-22 ENCOUNTER — Observation Stay: Payer: 59

## 2019-12-22 DIAGNOSIS — R05 Cough: Secondary | ICD-10-CM | POA: Diagnosis not present

## 2019-12-22 DIAGNOSIS — Z20822 Contact with and (suspected) exposure to covid-19: Secondary | ICD-10-CM | POA: Insufficient documentation

## 2019-12-22 DIAGNOSIS — Z3A33 33 weeks gestation of pregnancy: Secondary | ICD-10-CM | POA: Diagnosis not present

## 2019-12-22 DIAGNOSIS — O368131 Decreased fetal movements, third trimester, fetus 1: Principal | ICD-10-CM | POA: Insufficient documentation

## 2019-12-22 DIAGNOSIS — R197 Diarrhea, unspecified: Secondary | ICD-10-CM | POA: Insufficient documentation

## 2019-12-22 DIAGNOSIS — O368190 Decreased fetal movements, unspecified trimester, not applicable or unspecified: Secondary | ICD-10-CM | POA: Diagnosis present

## 2019-12-22 LAB — SARS CORONAVIRUS 2 BY RT PCR (HOSPITAL ORDER, PERFORMED IN ~~LOC~~ HOSPITAL LAB): SARS Coronavirus 2: NEGATIVE

## 2019-12-22 MED ORDER — ACETAMINOPHEN 325 MG PO TABS: 650.0000 mg | ORAL_TABLET | ORAL | Status: DC | PRN

## 2019-12-22 NOTE — Discharge Summary (Signed)
Patient ID: Tiffany Whitehead MRN: [id number] DOB/AGE: May 27, 1990 29 y.o.  Admit date: 12/22/2019 Discharge date: 12/23/2019  Admission Diagnoses: Decreased Fetal Movement, and productive cough/loose stools/temp at home  Discharge Diagnoses: NST reactive, BPP 10/10  Prenatal Procedures: NST and BPP  Consults: None  Significant Diagnostic Studies:  Results for orders placed or performed during the hospital encounter of 12/22/19 (from the past 168 hour(s))  SARS Coronavirus 2 by RT PCR (hospital order, performed in [facility] hospital lab) Nasopharyngeal Nasopharyngeal Swab   Collection Time: 12/22/19  1:23 PM   Specimen: Nasopharyngeal Swab  Result Value Ref Range   SARS Coronavirus 2 NEGATIVE NEGATIVE    US OB Limited  Result Date: 12/22/2019 CLINICAL DATA:  Decreased fetal movement. EXAM: LIMITED OBSTETRIC ULTRASOUND AND BIOPHYSICAL PROFILE FINDINGS: Number of Fetuses: 1 Heart Rate:  145 bpm Movement: Yes Presentation: Cephalic Previa: No Placental Location: Posterior Amniotic Fluid (Subjective): Within normal limits AFI 11.8 cm (5%ile= 8.3 cm, 95%= 24.5 cm for 33 wks) BIOPHYSICAL PROFILE Movement: 2 time: 13 minutes Breathing: 2 Tone:  2 Amniotic Fluid: 2 Total Score:  8 IMPRESSION: Single living intrauterine fetus in cephalic presentation. Amniotic fluid volume within normal limits, with AFI of 11.8 cm. Biophysical profile score is 8 out of 8. Electronically Signed   By: Danae Orleans M.D.   On: 12/22/2019 16:31   US FETAL BPP W/NONSTRESS  Result Date: 12/22/2019 CLINICAL DATA:  Decreased fetal movement. EXAM: LIMITED OBSTETRIC ULTRASOUND AND BIOPHYSICAL PROFILE FINDINGS: Number of Fetuses: 1 Heart Rate:  145 bpm Movement: Yes Presentation: Cephalic Previa: No Placental Location: Posterior Amniotic Fluid (Subjective): Within normal limits AFI 11.8 cm (5%ile= 8.3 cm, 95%= 24.5 cm for 33 wks) BIOPHYSICAL PROFILE Movement: 2 time: 13 minutes Breathing: 2 Tone:  2 Amniotic Fluid: 2 Total Score:  8  IMPRESSION: Single living intrauterine fetus in cephalic presentation. Amniotic fluid volume within normal limits, with AFI of 11.8 cm. Biophysical profile score is 8 out of 8. Electronically Signed   By: Danae Orleans M.D.   On: 12/22/2019 16:31    Treatments: none  Hospital Course:  This is a 29 y.o. G3P1011 with IUP at [redacted]w[redacted]d seen for DFM.  No UCs, leaking of fluid or bleeding.   She was observed, fetal heart rate monitoring remained reassuring, and her BPP was 8/8.  Education and precautions discussed. She was deemed stable for discharge to home with outpatient follow up.  Discharge Physical Exam:  BP 108/72 (BP Location: Left Arm)   Pulse 89   Temp 98.3 F (36.8 C) (Oral)   Resp 18   Ht 5\' 8"  (1.727 m)   Wt 77.1 kg   LMP 04/01/2019   BMI 25.85 kg/m   General: NAD CV: RRR Pulm: CTABL, nl effort ABD: s/nd/nt, gravid DVT Evaluation: LE non-ttp, no evidence of DVT on exam.  NST: FHR baseline: 140 bpm Variability: moderate Accelerations: yes Decelerations: none Time: 60 minutes Category/reactivity: reactive Amniotic Fluid Index: 11.8 cm Biophysical Profile: BPP Score 10/10  TOCO: quiet SVE: deferred      Discharge Condition: Stable  Disposition:  Discharge disposition: 01-Home or Self Care        Allergies as of 12/22/2019      Reactions   Ceclor [cefaclor] Rash      Medication List    STOP taking these medications   busPIRone 5 MG tablet Commonly known as: BUSPAR     TAKE these medications   acetaminophen 325 MG tablet Commonly known as: TYLENOL Take 2  tablets (650 mg total) by mouth every 6 (six) hours as needed for mild pain or moderate pain. What changed: how much to take   cetirizine 10 MG tablet Commonly known as: ZYRTEC Take 10 mg by mouth daily.   clonazePAM 0.5 MG tablet Commonly known as: KLONOPIN Take by mouth 2 (two) times daily as needed for anxiety.   fluconazole 150 MG tablet Commonly known as: DIFLUCAN Can take additional  dose three days later if symptoms persist   multivitamin-prenatal 27-0.8 MG Tabs tablet Take 1 tablet by mouth daily.   ondansetron 8 MG disintegrating tablet Commonly known as: ZOFRAN-ODT Take 1 tablet (8 mg total) by mouth every 8 (eight) hours.   ondansetron 8 MG disintegrating tablet Commonly known as: ZOFRAN-ODT Take 1 tablet (8 mg total) by mouth every 8 (eight) hours.   promethazine 25 MG suppository Commonly known as: PHENERGAN Place 25 mg rectally every 6 (six) hours as needed.   promethazine 25 MG tablet Commonly known as: PHENERGAN Take 1 tablet (25 mg total) by mouth every 6 (six) hours.   sertraline 100 MG tablet Commonly known as: ZOLOFT Take 200 mg by mouth at bedtime.       Follow-up Information    [facility] Follow up.   Why: Keep follow-up appointment on Thursday Contact information: [address] [place] [address] [phone]              Signed:  Quillian Quince 12/23/2019 2:45 AM

## 2019-12-22 NOTE — OB Triage Note (Signed)
Productive cough with fever x 5 days. Fever as high as 102.5 this am. Also c/o loose stools. CO decreased fetal movement. Las felt movement yesterday around noon. Elaina Hoops

## 2020-01-06 ENCOUNTER — Observation Stay
Admission: EM | Admit: 2020-01-06 | Discharge: 2020-01-06 | Disposition: A | Discharge status: Home / Self Care | Payer: Medicaid Other

## 2020-01-06 ENCOUNTER — Other Ambulatory Visit: Payer: Self-pay

## 2020-01-06 ENCOUNTER — Encounter: Payer: Self-pay | Admitting: Obstetrics and Gynecology

## 2020-01-06 DIAGNOSIS — F419 Anxiety disorder, unspecified: Secondary | ICD-10-CM | POA: Diagnosis not present

## 2020-01-06 DIAGNOSIS — O26833 Pregnancy related renal disease, third trimester: Secondary | ICD-10-CM | POA: Insufficient documentation

## 2020-01-06 DIAGNOSIS — R102 Pelvic and perineal pain: Secondary | ICD-10-CM | POA: Diagnosis not present

## 2020-01-06 DIAGNOSIS — O99343 Other mental disorders complicating pregnancy, third trimester: Secondary | ICD-10-CM | POA: Diagnosis not present

## 2020-01-06 DIAGNOSIS — N189 Chronic kidney disease, unspecified: Secondary | ICD-10-CM | POA: Insufficient documentation

## 2020-01-06 DIAGNOSIS — Z3A35 35 weeks gestation of pregnancy: Secondary | ICD-10-CM | POA: Diagnosis not present

## 2020-01-06 DIAGNOSIS — O26893 Other specified pregnancy related conditions, third trimester: Secondary | ICD-10-CM | POA: Diagnosis not present

## 2020-01-06 DIAGNOSIS — F329 Major depressive disorder, single episode, unspecified: Secondary | ICD-10-CM | POA: Insufficient documentation

## 2020-01-06 DIAGNOSIS — O26899 Other specified pregnancy related conditions, unspecified trimester: Secondary | ICD-10-CM | POA: Diagnosis present

## 2020-01-06 LAB — URINALYSIS, COMPLETE (UACMP) WITH MICROSCOPIC
Bilirubin Urine: NEGATIVE
Glucose, UA: NEGATIVE mg/dL
Hgb urine dipstick: NEGATIVE
Ketones, ur: NEGATIVE mg/dL
Nitrite: NEGATIVE
Protein, ur: 30 mg/dL — AB
Specific Gravity, Urine: 1.026 (ref 1.005–1.030)
pH: 6 (ref 5.0–8.0)

## 2020-01-06 MED ORDER — CYCLOBENZAPRINE HCL 5 MG PO TABS: 5.0000 mg | ORAL_TABLET | Freq: Three times a day (TID) | ORAL | 1 refills | Status: DC | PRN

## 2020-01-06 MED ORDER — CALCIUM CARBONATE ANTACID 500 MG PO CHEW: 2.0000 | CHEWABLE_TABLET | ORAL | Status: DC | PRN

## 2020-01-06 MED ORDER — ACETAMINOPHEN 500 MG PO TABS
1000.0000 mg | ORAL_TABLET | Freq: Four times a day (QID) | ORAL | Status: DC | PRN
  Administered 2020-01-06: 1000 mg via ORAL
  Filled 2020-01-06: qty 2

## 2020-01-06 MED ORDER — CYCLOBENZAPRINE HCL 10 MG PO TABS
5.0000 mg | ORAL_TABLET | ORAL | Status: AC
  Administered 2020-01-06: 5 mg via ORAL
  Filled 2020-01-06: qty 0.5

## 2020-01-06 NOTE — Discharge Summary (Signed)
Tiffany Whitehead is a 29 y.o. female. She is at [redacted]w[redacted]d gestation. Patient's last menstrual period was 04/01/2019. Estimated Date of Delivery: 02/09/20  Prenatal care site: [facility]  Chief complaint: severe pelvic pain   Location: suprapubic  Onset/timing: started yesterday  Duration: constant  Severity: severe Aggravating or alleviating conditions: worse with movement, better with rest, though has been constant all night  Associated signs/symptoms: denies LOF, vaginal bleeding, unsure of frequency of contractions Context: Nasira presents to L&D triage for evaluation of severe pelvic pain.  She has SPD and is unsure if the pain is preterm labor versus SPD pain.   S: Tearful, irreg contractions, no VB.no LOF,  Active fetal movement.   Maternal Medical History:  Past Medical Hx:  has a past medical history of Anxiety, Chronic kidney disease, Depression, H/O borderline personality disorder, and UTI (urinary tract infection).    Past Surgical Hx:  has a past surgical history that includes extraction of wisdom teeth; Colonoscopy; and Appendectomy.   Allergies  Allergen Reactions  . Ceclor [Cefaclor] Rash     Prior to Admission medications   Medication Sig Start Date End Date Taking? Authorizing Provider  cetirizine (ZYRTEC) 10 MG tablet Take 10 mg by mouth daily.   Yes [provider]  clonazePAM (KLONOPIN) 0.5 MG tablet Take by mouth 2 (two) times daily as needed for anxiety.   Yes [provider]  ondansetron (ZOFRAN-ODT) 8 MG disintegrating tablet Take 1 tablet (8 mg total) by mouth every 8 (eight) hours. 09/24/19  Yes [name], CNM  Prenatal Vit-Fe Fumarate-FA (MULTIVITAMIN-PRENATAL) 27-0.8 MG TABS tablet Take 1 tablet by mouth daily.   Yes [provider]  promethazine (PHENERGAN) 25 MG suppository Place 25 mg rectally every 6 (six) hours as needed. 07/27/19  Yes [provider]  promethazine (PHENERGAN) 25 MG tablet Take 1 tablet  (25 mg total) by mouth every 6 (six) hours. 09/24/19  Yes [name], CNM  sertraline (ZOLOFT) 100 MG tablet Take 200 mg by mouth at bedtime.  09/04/19  Yes [provider]  acetaminophen (TYLENOL) 325 MG tablet Take 2 tablets (650 mg total) by mouth every 6 (six) hours as needed for mild pain or moderate pain. Patient taking differently: Take 1,000 mg by mouth every 6 (six) hours as needed for mild pain or moderate pain.  11/24/17   [name], CNM  cyclobenzaprine (FLEXERIL) 5 MG tablet Take 1 tablet (5 mg total) by mouth every 8 (eight) hours as needed for muscle spasms. 01/06/20   Gustavo Lah, CNM  fluconazole (DIFLUCAN) 150 MG tablet Can take additional dose three days later if symptoms persist 12/10/19   McVey, Lurena Joiner A, CNM  ondansetron (ZOFRAN-ODT) 8 MG disintegrating tablet Take 1 tablet (8 mg total) by mouth every 8 (eight) hours. 09/24/19   [name], CNM    Social History: She  reports that she has never smoked. She has never used smokeless tobacco. She reports previous alcohol use. She reports that she does not use drugs.  Family History: family history includes Cancer in her mother and another family member. *  Review of Systems: A full review of systems was performed and negative except as noted in the HPI.    O:  BP 117/65   Pulse 84   LMP 04/01/2019  Results for orders placed or performed during the hospital encounter of 01/06/20 (from the past 48 hour(s))  Urinalysis, Complete w Microscopic Urine, Clean Catch   Collection Time: 01/06/20  5:52  PM  Result Value Ref Range   Color, Urine YELLOW (A) YELLOW   APPearance HAZY (A) CLEAR   Specific Gravity, Urine 1.026 1.005 - 1.030   pH 6.0 5.0 - 8.0   Glucose, UA NEGATIVE NEGATIVE mg/dL   Hgb urine dipstick NEGATIVE NEGATIVE   Bilirubin Urine NEGATIVE NEGATIVE   Ketones, ur NEGATIVE NEGATIVE mg/dL   Protein, ur 30 (A) NEGATIVE mg/dL   Nitrite NEGATIVE NEGATIVE   Leukocytes,Ua TRACE (A)  NEGATIVE   RBC / HPF 6-10 0 - 5 RBC/hpf   WBC, UA 0-5 0 - 5 WBC/hpf   Bacteria, UA RARE (A) NONE SEEN   Squamous Epithelial / LPF 0-5 0 - 5   Mucus PRESENT    Ca Oxalate Crys, UA PRESENT      Constitutional: NAD, AAOx3  HE/ENT: extraocular movements grossly intact, moist mucous membranes CV: RRR PULM: nl respiratory effort, CTABL     Abd: gravid, non-distended, soft, tender to palpation over suprapubic area and bilateral inguinal area.  Mild edema noted over suprapubic area    Ext: Non-tender, Nonedmeatous   Psych: mood appropriate, speech normal Pelvic: 1/50/-3/soft/posterior   NST:  Baseline: 135bpm  Variability: moderate Accelerations present x >2 Decelerations absent Toco: irregular mild contractions  Time   Assessment: 29 y.o. [redacted]w[redacted]d here for antenatal surveillance during pregnancy.  Principle diagnosis: Symphysis pubic dysfunction   Plan:  Labor: not present.   Fetal Wellbeing: Reassuring Cat 1 tracing.  Reactive NST   Discussed comfort measures for SPD  Will give flexeril for pain management and sleep tonight, Rx sent to pharmacy   Maternity support belt to help with pelvic pain -> may consider pelvic girdle for comfort   Continue with PFPT and chiropractic care   D/c home stable, precautions reviewed, follow-up as scheduled on Wednesday, 01/10/2020.   ----- Margaretmary Eddy, CNM Certified Nurse Midwife [facility]

## 2020-01-06 NOTE — OB Triage Note (Signed)
Patient here for pain that has been constant for last 24 hr rates pain 7 it is lower abdominal bilateral and in her lower back. She also has been nauseated and vomiting the last 12 hour and unable to sleep.

## 2020-01-06 NOTE — Discharge Instructions (Signed)
The Pubic Symphysis  The pubic symphysis is found on the anterior side of the pelvis and is the anterior boundary of the perineum.[2][4] The pubic bones form a cartilaginous joint in the median plane, the symphysis pubis.[2] The joint keeps the two bones of the pelvis together and steady during activity.   In cooperation with the sacroiliac joints, the symphysis forms a stable pelvic ring. This ring allows only some small mobility.[5]  The pubic symphysis is a cartilaginous joint which consists of a fibrocartilaginous interpubic disc.[2][4]The pubic bones are connected by four ligaments. The superior pubic ligaments start on the superior part of the pubis and go as far as the pubic tubercles. The arcuate pubic ligaments form the lower border of the pubic symphysis and blends with the fibrocartilaginous disc.[2] The joint stability is mostly given by the arcuate ligament and is also the strongest one. The four ligaments together neutralise shear and tensile stresses.[4]   The disc connects the adjacent surfaces of the two pubic bones. Both of these surfaces contain a thin layer of hyaline cartilage.[2] The junction is not flat, there are papilliform elevations with reciprocal depressions and ridges.[4]  This disc is very small in children and the hyaline cartilage is very wide, but it evolves conversely. The disc is higher, smaller and narrower in men.[2][4] Normally with women is the symphysis pubic gap 4-5 mm wide and it widens 2-3 mm during the last trimester of pregnancy. This is necessary to facilitate the delivery of the baby.[4] With symphysis pubis dysfunction the joints become more relaxed and allow instability in the pelvic girdle. When the gap is equal to or more than 10 mm there's a diastasis of the symphysis pubic.[4]     The Pelvic Floor  There are three layers of pelvic floor muscles. The first is the superficial perineal layer (innervated by the pudendal nerve) comprising the  Bulbocavernosus, Ischiocavernosus, Superficial transverse perineal and external anal sphincter (EAS).  The second layer comprises the deep urogenital diaphragm layer (innervated by pudendal nerve) including the Compressor urethera, Uretrovaginal sphincter, Deep transverse perineal.  The third layer is the pelvic diaphragm (innervated by sacral nerve roots S3-S5) including Levator ani, pubococcygeus (pubovaginalis, puborectalis), iliococcygeus, coccygeus/ischiococcygeus], Piriformis, Obturator internus.  The pelvic floor muscles function as a support for the organs that lie on it. The sphincters (anal and urethral sphincters) provide conscious control over the bowels and bladder, respectively, such that we are able to control the release of faeces/flatus, or urine, and to prevent and delay emptying until convenient.  Upon contraction, pelvic floor muscles will lift upwards the internal organs, and tighten the sphincters openings of the vagina, anus and urethra. Relaxation of the pelvic floor muscles allows for passage of faeces and urine.  Pregnancy tends to change all these muscles and so also their function.   Pregnancy  The exact cause of SPD is unknown certain but there are some theories.  Aslan et al.(2007) say that the aetiology of SPD is unknown.[2] Pregnancy leads to an altered pelvic load, lax ligaments and weaker musculature. This leads to spinopelvic instability, which manifests itself as symphysis pubic dysfunction.[4]  In the early stages of the pregnancy the corpus luteum secretes a lot of relaxin and progesterone.[4] From the 12th week of the pregnancy, this function is continued by the placenta and decidua.[4] Relaxin breaks down collagen in the pelvic joint and causes laity and softening. Progesterone has a similar effect. But relaxin has no correlation with the degree of symptoms of symphysis pubic dysfunction.[4]  A [place] study showed that genetic susceptibility to joint  dysfunction is possibly caused by an aberration of relaxin.[6] It would seem that the presence of laxity as the result of a hormonal link is undisputed.[4] However, there is no full disclosure for this.  Other factors contributing to SPD include physically strenuous work during pregnancy and fatigue with poor posture and lack of exercise. Weight gain, multiparity, increased maternal age and a history of difficult deliveries, including shoulder dystocia, may also play a role.  The effective accommodation of the joints to each specific load demand through an adequately tailored joint compression, as a function of gravity, coordinated muscle and ligament forces, to produce effective joint reaction forces under changing conditions, Because of the pregnancy (hormones) ligaments and muscles become more lax and no longer have the same ability than they had before, this is why pregnancy leads to an altered pelvic load. In combination, these lead to spinopelvic instability, most commonly manifest as SPD.[4]  In short, the causes for this instability include hormonal (relaxin), metabolic (calcium), biomechanical (load of pregnancy or physical exercise), weak musculature, body composition (weight), anatomical and genetic variations.[2]

## 2020-01-08 LAB — URINE CULTURE: Culture: 30000 — AB

## 2020-01-09 ENCOUNTER — Observation Stay
Admission: EM | Admit: 2020-01-09 | Discharge: 2020-01-10 | Disposition: A | Discharge status: Home / Self Care | Payer: 59 | Attending: Obstetrics and Gynecology | Admitting: Obstetrics and Gynecology

## 2020-01-09 ENCOUNTER — Encounter: Payer: Self-pay | Admitting: Obstetrics and Gynecology

## 2020-01-09 ENCOUNTER — Other Ambulatory Visit: Payer: Self-pay

## 2020-01-09 DIAGNOSIS — D649 Anemia, unspecified: Secondary | ICD-10-CM | POA: Insufficient documentation

## 2020-01-09 DIAGNOSIS — N189 Chronic kidney disease, unspecified: Secondary | ICD-10-CM | POA: Insufficient documentation

## 2020-01-09 DIAGNOSIS — W109XXA Fall (on) (from) unspecified stairs and steps, initial encounter: Secondary | ICD-10-CM | POA: Insufficient documentation

## 2020-01-09 DIAGNOSIS — O99343 Other mental disorders complicating pregnancy, third trimester: Secondary | ICD-10-CM | POA: Insufficient documentation

## 2020-01-09 DIAGNOSIS — F411 Generalized anxiety disorder: Secondary | ICD-10-CM | POA: Insufficient documentation

## 2020-01-09 DIAGNOSIS — F909 Attention-deficit hyperactivity disorder, unspecified type: Secondary | ICD-10-CM | POA: Insufficient documentation

## 2020-01-09 DIAGNOSIS — Z3A35 35 weeks gestation of pregnancy: Secondary | ICD-10-CM | POA: Insufficient documentation

## 2020-01-09 DIAGNOSIS — Y929 Unspecified place or not applicable: Secondary | ICD-10-CM | POA: Insufficient documentation

## 2020-01-09 DIAGNOSIS — O0993 Supervision of high risk pregnancy, unspecified, third trimester: Secondary | ICD-10-CM | POA: Insufficient documentation

## 2020-01-09 DIAGNOSIS — O26833 Pregnancy related renal disease, third trimester: Secondary | ICD-10-CM | POA: Insufficient documentation

## 2020-01-09 DIAGNOSIS — Y939 Activity, unspecified: Secondary | ICD-10-CM | POA: Insufficient documentation

## 2020-01-09 DIAGNOSIS — O34593 Maternal care for other abnormalities of gravid uterus, third trimester: Secondary | ICD-10-CM | POA: Insufficient documentation

## 2020-01-09 DIAGNOSIS — O26893 Other specified pregnancy related conditions, third trimester: Principal | ICD-10-CM | POA: Diagnosis present

## 2020-01-09 DIAGNOSIS — Y999 Unspecified external cause status: Secondary | ICD-10-CM | POA: Insufficient documentation

## 2020-01-09 DIAGNOSIS — O9913 Other diseases of the blood and blood-forming organs and certain disorders involving the immune mechanism complicating the puerperium: Secondary | ICD-10-CM | POA: Insufficient documentation

## 2020-01-09 DIAGNOSIS — R102 Pelvic and perineal pain: Secondary | ICD-10-CM | POA: Insufficient documentation

## 2020-01-09 NOTE — OB Triage Note (Addendum)
Recvd pt from ED. Pt states she was going down the stairs and slipped on the last 4 stairs and fell on side of belly/bottom and slid down around 2230. Pt called up to labor and delivery and was told to come in. No vaginal bleeding or LOF. Pt states her belly feels tight and crampy and that she has not felt the baby move since the fall. EFM applied and fhr in the 130's. Rates pain a 6 out of 10.

## 2020-01-10 DIAGNOSIS — O26833 Pregnancy related renal disease, third trimester: Secondary | ICD-10-CM | POA: Diagnosis not present

## 2020-01-10 DIAGNOSIS — O0993 Supervision of high risk pregnancy, unspecified, third trimester: Secondary | ICD-10-CM | POA: Diagnosis not present

## 2020-01-10 DIAGNOSIS — O26893 Other specified pregnancy related conditions, third trimester: Secondary | ICD-10-CM | POA: Diagnosis present

## 2020-01-10 DIAGNOSIS — F909 Attention-deficit hyperactivity disorder, unspecified type: Secondary | ICD-10-CM | POA: Diagnosis not present

## 2020-01-10 DIAGNOSIS — Y939 Activity, unspecified: Secondary | ICD-10-CM | POA: Diagnosis not present

## 2020-01-10 DIAGNOSIS — W109XXA Fall (on) (from) unspecified stairs and steps, initial encounter: Secondary | ICD-10-CM | POA: Diagnosis not present

## 2020-01-10 DIAGNOSIS — Y929 Unspecified place or not applicable: Secondary | ICD-10-CM | POA: Diagnosis not present

## 2020-01-10 DIAGNOSIS — R102 Pelvic and perineal pain: Secondary | ICD-10-CM | POA: Diagnosis not present

## 2020-01-10 DIAGNOSIS — N189 Chronic kidney disease, unspecified: Secondary | ICD-10-CM | POA: Diagnosis not present

## 2020-01-10 DIAGNOSIS — Z3A35 35 weeks gestation of pregnancy: Secondary | ICD-10-CM | POA: Diagnosis not present

## 2020-01-10 DIAGNOSIS — O9913 Other diseases of the blood and blood-forming organs and certain disorders involving the immune mechanism complicating the puerperium: Secondary | ICD-10-CM | POA: Diagnosis not present

## 2020-01-10 DIAGNOSIS — Y999 Unspecified external cause status: Secondary | ICD-10-CM | POA: Diagnosis not present

## 2020-01-10 DIAGNOSIS — F411 Generalized anxiety disorder: Secondary | ICD-10-CM | POA: Diagnosis not present

## 2020-01-10 DIAGNOSIS — D649 Anemia, unspecified: Secondary | ICD-10-CM | POA: Diagnosis not present

## 2020-01-10 DIAGNOSIS — O99343 Other mental disorders complicating pregnancy, third trimester: Secondary | ICD-10-CM | POA: Diagnosis not present

## 2020-01-10 DIAGNOSIS — O34593 Maternal care for other abnormalities of gravid uterus, third trimester: Secondary | ICD-10-CM | POA: Diagnosis not present

## 2020-01-10 NOTE — Discharge Summary (Signed)
Tiffany Whitehead is a 29 y.o. female. She is at [redacted]w[redacted]d gestation. Patient's last menstrual period was 04/01/2019. Estimated Date of Delivery: 02/09/20  Prenatal care site: [facility]   Current pregnancy complicated by:  1. Significant anxiety/mood issues (ADD, GAD): followed by [facility] for Women's Mood Disorder- Zoloft, Buspar, Klonopin 2. Hx Macrosomina, proven to 10#1 3. 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 Anemia 5. Hyperemesis 6. Pelvic pain, working with PFPT and Acupuncture/chiropractic care   Chief complaint: slipped on last 3 steps going down, fell on her side. Immediately after incident, noted tight abdomen and no fetal movement.    S: Resting comfortably. no CTX, no VB.no LOF,  Active fetal movement. Denies: HA, visual changes, SOB, or RUQ/epigastric pain  Maternal Medical History:   Past Medical History:  Diagnosis Date  . Anxiety   . Chronic kidney disease   . Depression   . H/O borderline personality disorder    stopped meds at beginning of pregnancy, Lamictal and Vyvanse  . UTI (urinary tract infection)     Past Surgical History:  Procedure Laterality Date  . APPENDECTOMY    . COLONOSCOPY    . extraction of wisdom teeth      Allergies  Allergen Reactions  . Ceclor [Cefaclor] Rash    Prior to Admission medications   Medication Sig Start Date End Date Taking? Authorizing Provider  clonazePAM (KLONOPIN) 0.5 MG tablet Take by mouth 2 (two) times daily as needed for anxiety.   Yes [provider]  cyclobenzaprine (FLEXERIL) 5 MG tablet Take 1 tablet (5 mg total) by mouth every 8 (eight) hours as needed for muscle spasms. 01/06/20  Yes Gustavo Lah, CNM  ondansetron (ZOFRAN-ODT) 8 MG disintegrating tablet Take 1 tablet (8 mg total) by mouth every 8 (eight) hours. 09/24/19  Yes [name], CNM  Prenatal Vit-Fe Fumarate-FA (MULTIVITAMIN-PRENATAL) 27-0.8 MG TABS tablet Take 1 tablet by mouth daily.   Yes [provider]   promethazine (PHENERGAN) 25 MG tablet Take 1 tablet (25 mg total) by mouth every 6 (six) hours. 09/24/19  Yes [name], CNM  sertraline (ZOLOFT) 100 MG tablet Take 200 mg by mouth at bedtime.  09/04/19  Yes [provider]  acetaminophen (TYLENOL) 325 MG tablet Take 2 tablets (650 mg total) by mouth every 6 (six) hours as needed for mild pain or moderate pain. Patient taking differently: Take 1,000 mg by mouth every 6 (six) hours as needed for mild pain or moderate pain.  11/24/17   [name], CNM  cetirizine (ZYRTEC) 10 MG tablet Take 10 mg by mouth daily.    [provider]  ondansetron (ZOFRAN-ODT) 8 MG disintegrating tablet Take 1 tablet (8 mg total) by mouth every 8 (eight) hours. 09/24/19   [name], CNM  promethazine (PHENERGAN) 25 MG suppository Place 25 mg rectally every 6 (six) hours as needed. 07/27/19   [provider]      Social History: She  reports that she has never smoked. She has never used smokeless tobacco. She reports previous alcohol use. She reports that she does not use drugs.  Family History: family history includes Cancer in her mother and another family member.   Review of Systems: A full review of systems was performed and negative except as noted in the HPI.     O:  BP 102/78   Pulse 93   Temp 98 F (36.7 C) (Oral)   Resp 18   LMP 04/01/2019  No results  found for this or any previous visit (from the past 48 hour(s)).   Constitutional: NAD, AAOx3  HE/ENT: extraocular movements grossly intact, moist mucous membranes CV: RRR PULM: nl respiratory effort, CTABL     Abd: gravid, non-tender, non-distended, soft      Ext: Non-tender, Nonedematous   Psych: mood appropriate, speech normal Pelvic: deferred   Fetal  monitoring: Cat I Appropriate for GA- reactive NST Baseline: 120bpm Variability: moderate Accelerations: present x >2 Decelerations absent  TOCO: occasional mild UI noted. Rare UC noted.      A/P: 29 y.o. [redacted]w[redacted]d here for antenatal surveillance for abdominal pain, s/p slip and fall.   Principle Diagnosis:  High risk pregnancy in third trimester   Labor: not present.   Fetal Wellbeing: Reassuring Cat 1 tracing with Reactive NST  Reassuring fetal monitoring 4hrs s/p fall. Fundus soft, nontender.   Pt reassured and DC home.    D/c home stable, precautions reviewed, follow-up as scheduled.    Randa Ngo, CNM 01/10/2020  2:48 AM

## 2020-01-21 ENCOUNTER — Encounter: Payer: Self-pay | Admitting: Obstetrics and Gynecology

## 2020-01-21 ENCOUNTER — Observation Stay
Admission: EM | Admit: 2020-01-21 | Discharge: 2020-01-21 | Disposition: A | Discharge status: Home / Self Care | Payer: 59 | Attending: Obstetrics and Gynecology | Admitting: Obstetrics and Gynecology

## 2020-01-21 DIAGNOSIS — O4292 Full-term premature rupture of membranes, unspecified as to length of time between rupture and onset of labor: Principal | ICD-10-CM | POA: Insufficient documentation

## 2020-01-21 DIAGNOSIS — F411 Generalized anxiety disorder: Secondary | ICD-10-CM | POA: Insufficient documentation

## 2020-01-21 DIAGNOSIS — O34533 Maternal care for retroversion of gravid uterus, third trimester: Secondary | ICD-10-CM | POA: Diagnosis not present

## 2020-01-21 DIAGNOSIS — N189 Chronic kidney disease, unspecified: Secondary | ICD-10-CM | POA: Diagnosis not present

## 2020-01-21 DIAGNOSIS — O26833 Pregnancy related renal disease, third trimester: Secondary | ICD-10-CM | POA: Insufficient documentation

## 2020-01-21 DIAGNOSIS — Z0371 Encounter for suspected problem with amniotic cavity and membrane ruled out: Secondary | ICD-10-CM | POA: Diagnosis present

## 2020-01-21 DIAGNOSIS — O99343 Other mental disorders complicating pregnancy, third trimester: Secondary | ICD-10-CM | POA: Insufficient documentation

## 2020-01-21 DIAGNOSIS — Z3A37 37 weeks gestation of pregnancy: Secondary | ICD-10-CM | POA: Diagnosis not present

## 2020-01-21 LAB — RUPTURE OF MEMBRANE (ROM)PLUS: Rom Plus: NEGATIVE

## 2020-01-21 NOTE — OB Triage Note (Addendum)
Patient to OBS 4 with complaints of LOF off and on since 7pm Saturday evening. She reports having contractions off and on 3 pm Saturday afternoon.  She also reports bloody show since 5 pm Saturday evening.  Cervix check. ROM plus collected with negative result.  Patient discharged with labor precautions.

## 2020-01-21 NOTE — Discharge Instructions (Signed)
Please keep your next scheduled appointment.  If you have concerns you can call the on call provider.  If you have emergent concerns please go to the nearest ED for evaluation.

## 2020-01-21 NOTE — Discharge Summary (Signed)
Tiffany Whitehead is a 29 y.o. female. She is at [redacted]w[redacted]d gestation. Patient's last menstrual period was 04/01/2019. Estimated Date of Delivery: 02/09/20  Prenatal care site: [facility]  Current pregnancy complicated by:  1. Generalized anxiety disorder- Zoloft, Buspar and Klonopin; counseling with [place] trial at [facility].  2. Bicornuate uterus, confirmed cephalic presentation, pelvis proven to 10lbs 3. Suprapubic dysfunction, PFPT 4. Iron deficiency anemia, on iron supplement 5. Hyperemesis- resolved   Chief complaint: gush of fluid occurring this afternoon, was wearing an "adult diaper" and soaked through it. Felt that the fluid had a sweet smell. Has been having intermittent contractions since yesterday afternoon, membranes were swept in the office on Thursday or Friday. Pt reports active FM, mucus plug noted in bits since yesterday, some light pink/red mixed in mucus.  - Pt reports use of red raspberry tea, walking and on birthing ball frequently. She is "ready for baby to come"    S: Resting comfortably.  Active fetal movement. Denies: HA, visual changes, SOB, or RUQ/epigastric pain  Maternal Medical History:   Past Medical History:  Diagnosis Date  . Anxiety   . Chronic kidney disease    stones and infection  . Depression   . H/O borderline personality disorder    stopped meds at beginning of pregnancy, Lamictal and Vyvanse  . UTI (urinary tract infection)     Past Surgical History:  Procedure Laterality Date  . APPENDECTOMY    . COLONOSCOPY    . extraction of wisdom teeth      Allergies  Allergen Reactions  . Ceclor [Cefaclor] Rash    Prior to Admission medications   Medication Sig Start Date End Date Taking? Authorizing Provider  acetaminophen (TYLENOL) 325 MG tablet Take 2 tablets (650 mg total) by mouth every 6 (six) hours as needed for mild pain or moderate pain. Patient taking differently: Take 1,000 mg by mouth every 6 (six) hours as needed for  mild pain or moderate pain.  11/24/17  Yes [name], CNM  clonazePAM (KLONOPIN) 0.5 MG tablet Take by mouth 2 (two) times daily as needed for anxiety.   Yes [provider]  cyclobenzaprine (FLEXERIL) 5 MG tablet Take 1 tablet (5 mg total) by mouth every 8 (eight) hours as needed for muscle spasms. 01/06/20  Yes Gustavo Lah, CNM  ondansetron (ZOFRAN-ODT) 8 MG disintegrating tablet Take 1 tablet (8 mg total) by mouth every 8 (eight) hours. 09/24/19  Yes [name], CNM  ondansetron (ZOFRAN-ODT) 8 MG disintegrating tablet Take 1 tablet (8 mg total) by mouth every 8 (eight) hours. 09/24/19  Yes [name], CNM  Prenatal Vit-Fe Fumarate-FA (MULTIVITAMIN-PRENATAL) 27-0.8 MG TABS tablet Take 1 tablet by mouth daily.   Yes [provider]  promethazine (PHENERGAN) 25 MG suppository Place 25 mg rectally every 6 (six) hours as needed. 07/27/19  Yes [provider]  promethazine (PHENERGAN) 25 MG tablet Take 1 tablet (25 mg total) by mouth every 6 (six) hours. 09/24/19  Yes [name], CNM  sertraline (ZOLOFT) 100 MG tablet Take 200 mg by mouth at bedtime.  09/04/19  Yes [provider]  cetirizine (ZYRTEC) 10 MG tablet Take 10 mg by mouth daily. Patient not taking: Reported on 01/21/2020    [provider]      Social History: She  reports that she has never smoked. She has never used smokeless tobacco. She reports previous alcohol use. She reports that she does not use drugs.  Family History: family  history includes Cancer in her mother and another family member.   Review of Systems: A full review of systems was performed and negative except as noted in the HPI.     O:  BP 111/63 (BP Location: Left Arm)   Pulse 78   Temp 98.9 F (37.2 C) (Oral)   Resp 16   Ht 5\' 8"  (1.727 m)   Wt 81.6 kg Comment: 180lbs  LMP 04/01/2019   BMI 27.37 kg/m  Results for orders placed or performed during the hospital encounter of 01/21/20 (from  the past 48 hour(s))  ROM Plus ([facility] only)   Collection Time: 01/21/20  2:16 PM  Result Value Ref Range   Rom Plus NEGATIVE      Constitutional: NAD, AAOx3  HE/ENT: extraocular movements grossly intact, moist mucous membranes CV: RRR PULM: nl respiratory effort, CTABL     Abd: gravid, non-tender, non-distended, soft    Ext: Non-tender, Nonedematous   Psych: mood appropriate, speech normal Pelvic: SSE done- mod amt tan mucus with streaks of dark red and pink bloody show. NO pooling, no LOF noted on spec exam.    Fetal  monitoring: Cat I Appropriate for GA Baseline: 130bpm Variability: moderate Accelerations: present x >2 Decelerations absent Time 03/22/20  TOCO: uterine irritability, q2-min, mild UCs 20-40sec  ROM Plus: negative  A/P: 29 y.o. [redacted]w[redacted]d here for antenatal surveillance for suspected ROM not found.   Principle Diagnosis:  Suspected ROM not found, 37wks  Labor: not present.   Fetal Wellbeing: Reassuring Cat 1 tracing with reactive NST   Pt reassured, encouraged to hydrate and rest.   D/c home stable, precautions reviewed, follow-up as scheduled.    [redacted]w[redacted]d, CNM 01/21/2020  3:02 PM

## 2020-01-26 ENCOUNTER — Inpatient Hospital Stay
Admission: EM | Admit: 2020-01-26 | Discharge: 2020-01-28 | DRG: 807 | Disposition: A | Discharge status: Home / Self Care | Payer: Medicaid Other

## 2020-01-26 ENCOUNTER — Other Ambulatory Visit: Payer: Self-pay

## 2020-01-26 ENCOUNTER — Encounter: Payer: Self-pay | Admitting: Obstetrics and Gynecology

## 2020-01-26 DIAGNOSIS — F411 Generalized anxiety disorder: Secondary | ICD-10-CM | POA: Diagnosis present

## 2020-01-26 DIAGNOSIS — O99344 Other mental disorders complicating childbirth: Secondary | ICD-10-CM | POA: Diagnosis present

## 2020-01-26 DIAGNOSIS — F902 Attention-deficit hyperactivity disorder, combined type: Secondary | ICD-10-CM | POA: Diagnosis present

## 2020-01-26 DIAGNOSIS — Z20822 Contact with and (suspected) exposure to covid-19: Secondary | ICD-10-CM | POA: Diagnosis present

## 2020-01-26 DIAGNOSIS — D509 Iron deficiency anemia, unspecified: Secondary | ICD-10-CM | POA: Diagnosis present

## 2020-01-26 DIAGNOSIS — O479 False labor, unspecified: Secondary | ICD-10-CM | POA: Diagnosis present

## 2020-01-26 DIAGNOSIS — O368130 Decreased fetal movements, third trimester, not applicable or unspecified: Principal | ICD-10-CM | POA: Diagnosis present

## 2020-01-26 DIAGNOSIS — Z3A38 38 weeks gestation of pregnancy: Secondary | ICD-10-CM | POA: Diagnosis not present

## 2020-01-26 DIAGNOSIS — O288 Other abnormal findings on antenatal screening of mother: Secondary | ICD-10-CM | POA: Diagnosis present

## 2020-01-26 DIAGNOSIS — O9902 Anemia complicating childbirth: Secondary | ICD-10-CM | POA: Diagnosis present

## 2020-01-26 LAB — CBC
HCT: 27.8 % — ABNORMAL LOW (ref 36.0–46.0)
Hemoglobin: 9.1 g/dL — ABNORMAL LOW (ref 12.0–15.0)
MCH: 28.8 pg (ref 26.0–34.0)
MCHC: 32.7 g/dL (ref 30.0–36.0)
MCV: 88 fL (ref 80.0–100.0)
Platelets: 205 10*3/uL (ref 150–400)
RBC: 3.16 MIL/uL — ABNORMAL LOW (ref 3.87–5.11)
RDW: 14.2 % (ref 11.5–15.5)
WBC: 18.6 10*3/uL — ABNORMAL HIGH (ref 4.0–10.5)
nRBC: 0 % (ref 0.0–0.2)

## 2020-01-26 LAB — TYPE AND SCREEN
ABO/RH(D): A POS
Antibody Screen: NEGATIVE

## 2020-01-26 LAB — SARS CORONAVIRUS 2 BY RT PCR (HOSPITAL ORDER, PERFORMED IN ~~LOC~~ HOSPITAL LAB): SARS Coronavirus 2: NEGATIVE

## 2020-01-26 MED ORDER — SOD CITRATE-CITRIC ACID 500-334 MG/5ML PO SOLN: 30.0000 mL | ORAL | Status: DC | PRN

## 2020-01-26 MED ORDER — CALCIUM CARBONATE ANTACID 500 MG PO CHEW: 2.0000 | CHEWABLE_TABLET | ORAL | Status: DC | PRN

## 2020-01-26 MED ORDER — OXYTOCIN BOLUS FROM INFUSION: 333.0000 mL | Freq: Once | INTRAVENOUS | Status: DC

## 2020-01-26 MED ORDER — FENTANYL CITRATE (PF) 100 MCG/2ML IJ SOLN
50.0000 ug | INTRAMUSCULAR | Status: DC | PRN
  Administered 2020-01-27 (×4): 100 ug via INTRAVENOUS
  Filled 2020-01-26 (×4): qty 2

## 2020-01-26 MED ORDER — LIDOCAINE HCL (PF) 1 % IJ SOLN: 30.0000 mL | INTRAMUSCULAR | Status: DC | PRN

## 2020-01-26 MED ORDER — LACTATED RINGERS IV SOLN
500.0000 mL | INTRAVENOUS | Status: DC | PRN
  Administered 2020-01-26: 1000 mL via INTRAVENOUS

## 2020-01-26 MED ORDER — LACTATED RINGERS IV SOLN: INTRAVENOUS | Status: DC

## 2020-01-26 MED ORDER — ACETAMINOPHEN 500 MG PO TABS
1000.0000 mg | ORAL_TABLET | Freq: Four times a day (QID) | ORAL | Status: DC | PRN
  Administered 2020-01-26: 1000 mg via ORAL
  Filled 2020-01-26: qty 2

## 2020-01-26 MED ORDER — ACETAMINOPHEN 325 MG PO TABS: 650.0000 mg | ORAL_TABLET | ORAL | Status: DC | PRN

## 2020-01-26 MED ORDER — ONDANSETRON HCL 4 MG/2ML IJ SOLN
4.0000 mg | Freq: Four times a day (QID) | INTRAMUSCULAR | Status: DC | PRN
  Administered 2020-01-27: 4 mg via INTRAVENOUS
  Filled 2020-01-26: qty 2

## 2020-01-26 MED ORDER — OXYTOCIN-SODIUM CHLORIDE 30-0.9 UT/500ML-% IV SOLN
2.5000 [IU]/h | INTRAVENOUS | Status: DC
  Filled 2020-01-26: qty 500

## 2020-01-26 NOTE — OB Triage Note (Signed)
Patient arrived in triage with complaints of contractions since approx 1000 today, frequency increased to about every 6 mins. Patient reports positive fetal movement and "losing mucous plug". Denies leaking of fluid and vaginal bleeding. Monitors applied and assessing.

## 2020-01-26 NOTE — H&P (Addendum)
OB History & Physical   History of Present Illness:  Chief Complaint:   HPI:  Tiffany Whitehead is a 29 y.o. G3P1011 female at [redacted]w[redacted]d dated by 04/01/2019.  She presents to L&D for uterine contractions and decreased fetal movement.   Reports overall decreased fetal movement but unsure because of contractions  Contractions: every 5 to 6 minutes, started at 0500 LOF/SROM: denies  Vaginal bleeding: denies   Pregnancy Issues: 1. ADHD 2. Generalized anxiety disorder  3. History of postpartum depression 4. Bicornate uterus? 5. History of macrosomia  6. Physiologic anemia of pregnancy   Patient Active Problem List   Diagnosis Date Noted  . Uterine contractions during pregnancy 01/26/2020  . Non-reactive NST (non-stress test) 01/26/2020  . Pelvic pain in pregnancy 01/06/2020  . Encounter for suspected PROM, with rupture of membranes not found 12/10/2019  . UTI (urinary tract infection) during pregnancy 11/21/2019  . Encounter for supervision of other normal pregnancy, third trimester 07/10/2019  . Anxiety 11/22/2017  . GAD (generalized anxiety disorder) 07/04/2015  . ADHD (attention deficit hyperactivity disorder), combined type 07/04/2015  . Family history of colon cancer 07/04/2015  . Lactose intolerance 07/04/2015  . Transient gluten sensitivity 07/04/2015     Maternal Medical History:   Past Medical History:  Diagnosis Date  . Anxiety   . Chronic kidney disease    stones and infection  . Depression   . H/O borderline personality disorder    stopped meds at beginning of pregnancy, Lamictal and Vyvanse  . Hyperemesis affecting pregnancy, antepartum 09/23/2019  . UTI (urinary tract infection)     Past Surgical History:  Procedure Laterality Date  . APPENDECTOMY    . COLONOSCOPY    . extraction of wisdom teeth      Allergies  Allergen Reactions  . Ceclor [Cefaclor] Rash    Prior to Admission medications   Medication Sig Start Date End Date Taking? Authorizing  Provider  acetaminophen (TYLENOL) 325 MG tablet Take 2 tablets (650 mg total) by mouth every 6 (six) hours as needed for mild pain or moderate pain. Patient taking differently: Take 1,000 mg by mouth every 6 (six) hours as needed for mild pain or moderate pain.  11/24/17  Yes [name], CNM  clonazePAM (KLONOPIN) 0.5 MG tablet Take by mouth 2 (two) times daily as needed for anxiety.   Yes [provider]  cyclobenzaprine (FLEXERIL) 5 MG tablet Take 1 tablet (5 mg total) by mouth every 8 (eight) hours as needed for muscle spasms. 01/06/20  Yes Gustavo Lah, CNM  loratadine (CLARITIN) 10 MG tablet Take 10 mg by mouth daily.   Yes [provider]  ondansetron (ZOFRAN-ODT) 8 MG disintegrating tablet Take 1 tablet (8 mg total) by mouth every 8 (eight) hours. 09/24/19  Yes [name], CNM  ondansetron (ZOFRAN-ODT) 8 MG disintegrating tablet Take 1 tablet (8 mg total) by mouth every 8 (eight) hours. 09/24/19  Yes [name], CNM  Prenatal Vit-Fe Fumarate-FA (MULTIVITAMIN-PRENATAL) 27-0.8 MG TABS tablet Take 1 tablet by mouth daily.   Yes [provider]  sertraline (ZOLOFT) 100 MG tablet Take 200 mg by mouth at bedtime.  09/04/19  Yes [provider]  cetirizine (ZYRTEC) 10 MG tablet Take 10 mg by mouth daily. Patient not taking: Reported on 01/21/2020    [provider]  promethazine (PHENERGAN) 25 MG suppository Place 25 mg rectally every 6 (six) hours as needed. Patient not taking: Reported on 01/26/2020 07/27/19   [provider]  promethazine (  PHENERGAN) 25 MG tablet Take 1 tablet (25 mg total) by mouth every 6 (six) hours. Patient not taking: Reported on 01/26/2020 09/24/19   [name], CNM     Prenatal care site:  [facility]  Social History: She  reports that she has never smoked. She has never used smokeless tobacco. She reports previous alcohol use. She reports that she does not use drugs.  Family  History: family history includes Cancer in her mother and another family member.   Review of Systems: A full review of systems was performed and negative except as noted in the HPI.     Physical Exam:  Vital Signs: BP 118/77 (BP Location: Left Arm)   Pulse 92   Temp 98.7 F (37.1 C) (Oral)   Resp 18   Ht 5\' 8"  (1.727 m)   Wt 81.6 kg   LMP 04/01/2019   BMI 27.37 kg/m  Physical Exam  General: no acute distress.  HEENT: normocephalic, atraumatic Heart: regular rate & rhythm.  No murmurs/rubs/gallops Lungs: clear to auscultation bilaterally, normal respiratory effort Abdomen: soft, gravid, non-tender;  EFW: 8lbs  Pelvic:   External: Normal external female genitalia  Cervix: Dilation: 4 / Effacement (%): 60 / Station: -2    Extremities: non-tender, symmetric, no edema bilaterally.  DTRs: 2+/2+  Neurologic: Alert & oriented x 3.    No results found for this or any previous visit (from the past 24 hour(s)).  Pertinent Results:  Prenatal Labs: Blood type/Rh A Pos  Antibody screen neg  Rubella Immune  Varicella Immune  RPR NR  HBsAg Neg  HIV NR  GC neg  Chlamydia neg  Genetic screening negative  1 hour GTT 102  3 hour GTT n/a  GBS Negative    FHT: Baseline: 155 bpm, Variability: moderate, Accelerations: present and Decelerations: Variable: intermittent  TOCO: irregular, every 6-8 minutes SVE:  Dilation: 4 / Effacement (%): 60 / Station: -2    Cephalic by Leopolds and SVE   No results found.  Assessment:  Tiffany Whitehead is a 29 y.o. G59P1011 female at [redacted]w[redacted]d with early labor, advanced dilation, decreased fetal movement.   Plan:  1. Admit to Labor & Delivery; consents reviewed and obtained - Covid admission screen   2. Fetal Well being  - Fetal Tracing: Cat II - overall reassuring with moderate variability  - Group B Streptococcus ppx not indicated: GBS neg - Presentation: cephalic confirmed by leopolds and SVE    3. Routine OB: - Prenatal labs reviewed, as  above - Rh pos - CBC, T&S, RPR on admit - Clear fluids, IVF  4. Monitoring of labor  -  Contractions monitored with external toco -  Pelvis proven to 10lbs  -  Plan for expectant management  -  Will recheck cervix in 3-4 hours, consider augmentation with oxytocin and/or AROM -  Plan for  continuous fetal monitoring -  Maternal pain control as desired -  Ziyonna is in communication with doula, will call to come in when contractions more painful - Anticipate vaginal delivery  5. Post Partum Planning: - Infant feeding: breast - Contraception: vasectomy - Tdap vaccine: 11/17/2019 - Flu vaccine: due in current season - Covid-19 vaccine: received x 2  09-30-1998, CNM 01/26/20 10:42 PM  03/27/20, CNM Certified Nurse Midwife [facility]

## 2020-01-27 ENCOUNTER — Inpatient Hospital Stay: Payer: Medicaid Other | Admitting: Anesthesiology

## 2020-01-27 ENCOUNTER — Encounter: Payer: Self-pay | Admitting: Obstetrics and Gynecology

## 2020-01-27 LAB — RPR: RPR Ser Ql: NONREACTIVE

## 2020-01-27 MED ORDER — LIDOCAINE HCL (PF) 1 % IJ SOLN
INTRAMUSCULAR | Status: DC | PRN
  Administered 2020-01-27: 3 mL

## 2020-01-27 MED ORDER — LIDOCAINE-EPINEPHRINE (PF) 1.5 %-1:200000 IJ SOLN
INTRAMUSCULAR | Status: DC | PRN
  Administered 2020-01-27: 3 mL via EPIDURAL

## 2020-01-27 MED ORDER — MISOPROSTOL 200 MCG PO TABS
ORAL_TABLET | ORAL | Status: AC
  Filled 2020-01-27: qty 4

## 2020-01-27 MED ORDER — DIPHENHYDRAMINE HCL 25 MG PO CAPS: 25.0000 mg | ORAL_CAPSULE | Freq: Four times a day (QID) | ORAL | Status: DC | PRN

## 2020-01-27 MED ORDER — EPHEDRINE 5 MG/ML INJ: 10.0000 mg | INTRAVENOUS | Status: DC | PRN

## 2020-01-27 MED ORDER — FENTANYL 2.5 MCG/ML W/ROPIVACAINE 0.15% IN NS 100 ML EPIDURAL (ARMC)
EPIDURAL | Status: DC | PRN
Start: 2020-01-27 — End: 2020-01-27
  Administered 2020-01-27: 12 mL/h via EPIDURAL

## 2020-01-27 MED ORDER — OXYTOCIN 10 UNIT/ML IJ SOLN
INTRAMUSCULAR | Status: AC
  Filled 2020-01-27: qty 2

## 2020-01-27 MED ORDER — SODIUM CHLORIDE 0.9 % IV SOLN
INTRAVENOUS | Status: DC | PRN
  Administered 2020-01-27: 10 mL via EPIDURAL

## 2020-01-27 MED ORDER — PHENYLEPHRINE 40 MCG/ML (10ML) SYRINGE FOR IV PUSH (FOR BLOOD PRESSURE SUPPORT): 80.0000 ug | PREFILLED_SYRINGE | INTRAVENOUS | Status: DC | PRN

## 2020-01-27 MED ORDER — AMMONIA AROMATIC IN INHA
RESPIRATORY_TRACT | Status: AC
  Filled 2020-01-27: qty 10

## 2020-01-27 MED ORDER — PRENATAL MULTIVITAMIN CH
1.0000 | ORAL_TABLET | Freq: Every day | ORAL | Status: DC
  Administered 2020-01-28: 1 via ORAL
  Filled 2020-01-27: qty 1

## 2020-01-27 MED ORDER — ACETAMINOPHEN 325 MG PO TABS
650.0000 mg | ORAL_TABLET | ORAL | Status: DC | PRN
  Administered 2020-01-27 (×2): 650 mg via ORAL
  Filled 2020-01-27 (×2): qty 2

## 2020-01-27 MED ORDER — LIDOCAINE HCL (PF) 1 % IJ SOLN
INTRAMUSCULAR | Status: AC
  Filled 2020-01-27: qty 30

## 2020-01-27 MED ORDER — WITCH HAZEL-GLYCERIN EX PADS: 1.0000 "application " | MEDICATED_PAD | CUTANEOUS | Status: DC

## 2020-01-27 MED ORDER — DIBUCAINE (PERIANAL) 1 % EX OINT: 1.0000 "application " | TOPICAL_OINTMENT | CUTANEOUS | Status: DC | PRN

## 2020-01-27 MED ORDER — SERTRALINE HCL 100 MG PO TABS
200.0000 mg | ORAL_TABLET | Freq: Every day | ORAL | Status: DC
  Administered 2020-01-27: 200 mg via ORAL
  Filled 2020-01-27 (×3): qty 2

## 2020-01-27 MED ORDER — ONDANSETRON HCL 4 MG PO TABS
4.0000 mg | ORAL_TABLET | ORAL | Status: DC | PRN
  Filled 2020-01-27: qty 1

## 2020-01-27 MED ORDER — LACTATED RINGERS IV SOLN: 500.0000 mL | Freq: Once | INTRAVENOUS | Status: DC

## 2020-01-27 MED ORDER — FERROUS SULFATE 325 (65 FE) MG PO TABS
325.0000 mg | ORAL_TABLET | Freq: Two times a day (BID) | ORAL | Status: DC
  Administered 2020-01-27 – 2020-01-28 (×2): 325 mg via ORAL
  Filled 2020-01-27 (×2): qty 1

## 2020-01-27 MED ORDER — ONDANSETRON HCL 4 MG/2ML IJ SOLN: 4.0000 mg | INTRAMUSCULAR | Status: DC | PRN

## 2020-01-27 MED ORDER — FENTANYL 2.5 MCG/ML W/ROPIVACAINE 0.15% IN NS 100 ML EPIDURAL (ARMC): 12.0000 mL/h | EPIDURAL | Status: DC

## 2020-01-27 MED ORDER — IBUPROFEN 600 MG PO TABS
600.0000 mg | ORAL_TABLET | Freq: Four times a day (QID) | ORAL | Status: DC
  Administered 2020-01-27 – 2020-01-28 (×4): 600 mg via ORAL
  Filled 2020-01-27 (×4): qty 1

## 2020-01-27 MED ORDER — SENNOSIDES-DOCUSATE SODIUM 8.6-50 MG PO TABS
2.0000 | ORAL_TABLET | ORAL | Status: DC
  Administered 2020-01-27: 2 via ORAL
  Filled 2020-01-27 (×2): qty 2

## 2020-01-27 MED ORDER — OXYTOCIN-SODIUM CHLORIDE 30-0.9 UT/500ML-% IV SOLN: 1.0000 m[IU]/min | INTRAVENOUS | Status: DC

## 2020-01-27 MED ORDER — TERBUTALINE SULFATE 1 MG/ML IJ SOLN: 0.2500 mg | Freq: Once | INTRAMUSCULAR | Status: DC | PRN

## 2020-01-27 MED ORDER — DIPHENHYDRAMINE HCL 50 MG/ML IJ SOLN: 12.5000 mg | INTRAMUSCULAR | Status: DC | PRN

## 2020-01-27 MED ORDER — COCONUT OIL OIL
1.0000 "application " | TOPICAL_OIL | Status: DC | PRN
  Administered 2020-01-28: 1 via TOPICAL
  Filled 2020-01-27: qty 120

## 2020-01-27 MED ORDER — FENTANYL 2.5 MCG/ML W/ROPIVACAINE 0.15% IN NS 100 ML EPIDURAL (ARMC)
EPIDURAL | Status: AC
Start: 2020-01-27 — End: 2020-01-27
  Filled 2020-01-27: qty 100

## 2020-01-27 MED ORDER — CLONAZEPAM 0.5 MG PO TABS
0.5000 mg | ORAL_TABLET | Freq: Two times a day (BID) | ORAL | Status: DC | PRN
  Filled 2020-01-27: qty 1

## 2020-01-27 MED ORDER — SIMETHICONE 80 MG PO CHEW: 160.0000 mg | CHEWABLE_TABLET | ORAL | Status: DC | PRN

## 2020-01-27 MED ORDER — BENZOCAINE-MENTHOL 20-0.5 % EX AERO
1.0000 "application " | INHALATION_SPRAY | CUTANEOUS | Status: DC | PRN
  Administered 2020-01-28: 1 via TOPICAL
  Filled 2020-01-27: qty 56

## 2020-01-27 NOTE — Discharge Instructions (Signed)
After Your Delivery Discharge Instructions   Postpartum: Care Instructions  After childbirth (postpartum period), your body goes through many changes. Some of these changes happen over several weeks. In the hours after delivery, your body will begin to recover from childbirth while it prepares to breastfeed your newborn. You may feel emotional during this time. Your hormones can shift your mood without warning for no clear reason.  In the first couple of weeks after childbirth, many women have emotions that change from happy to sad. You may find it hard to sleep. You may cry a lot. This is called the "baby blues." These overwhelming emotions often go away within a couple of days or weeks. But it's important to discuss your feelings with your doctor.  You should call your care provider if you have unrelieved feelings of:  Inability to cope  Sadness  Anxiety  Lack of interest in baby  Insomnia  Crying  It is easy to get too tired and overwhelmed during the first weeks after childbirth. Don't try to do too much. Get rest whenever you can, accept help from others, and eat well and drink plenty of fluids.  About 4 to 6 weeks after your baby's birth, you will have a follow-up visit with your care provider. This visit is your time to talk to your provider about anything you are concerned or curious about.  Follow-up care is a key part of your treatment and safety. Be sure to make and go to all appointments, and call your doctor if you are having problems. It's also a good idea to know your test results and keep a list of the medicines you take.  How can you care for yourself at home?  Sleep or rest when your baby sleeps.  Get help with household chores from family or friends, if you can. Do not try to do it all yourself.  If you have hemorrhoids or swelling or pain around the opening of your vagina, try using cold and heat. You can put ice or a cold pack on the area for 10 to 20 minutes at  a time. Put a thin cloth between the ice and your skin. Also try sitting in a few inches of warm water (sitz bath) 3 times a day and after bowel movements.  Take pain medicines exactly as directed.  If the provider gave you a prescription medicine for pain, take it as prescribed.  If you do not have a prescription and need something over the counter, you can take:  Ibuprofen (Motrin, Advil) up to 600mg every 6 hours as needed for pain  Acetaminophen (Tylenol) up to 650mg every 4 hours as needed for pain  Some people find it helpful to alternate between these two medications.   No driving for 1-2 weeks or while taking pain medications.   Eat more fiber to avoid constipation. Include foods such as whole-grain breads and cereals, raw vegetables, raw and dried fruits, and beans.  Drink plenty of fluids, enough so that your urine is light yellow or clear like water. If you have kidney, heart, or liver disease and have to limit fluids, talk with your doctor before you increase the amount of fluids you drink.  Do not put anything in the vagina for 6 weeks. This means no sex, no tampons, no douching, and no enemas.  If you have stitches, keep the area clean by pouring or spraying warm water over the area outside your vagina and anus after you use the toilet.    No strenuous activity or heavy lifting for 6 weeks   No tub baths; showers only  Continue prenatal vitamin and iron.  If breastfeeding:  Increase calories and fluids while breastfeeding.  You may have a slight fever when your milk comes in, but it should go away on its own. If it does not, and rises above 101.0 please call the doctor.  For breastfeeding concerns, the lactation consultant can be reached at [phone].  For concerns about your baby, please call your pediatrician.   Keep a list of questions to bring to your postpartum visit. Your questions might be about:  Changes in your breasts, such as lumps or  soreness.  When to expect your menstrual period to start again.  What form of birth control is best for you.  Weight you have put on during the pregnancy.  Exercise options.  What foods and drinks are best for you, especially if you are breastfeeding.  Problems you might be having with breastfeeding.  When you can have sex. Some women may want to talk about lubricants for the vagina.  Any feelings of sadness or restlessness that you are having.   When should you call for help?  Call 911 anytime you think you may need emergency care. For example, call if:  You have thoughts of harming yourself, your baby, or another person.  You passed out (lost consciousness).  Call the office at [phone] or seek immediate medical care if:  If you have heavy bleeding such that you are soaking 1 pad in an hour for 2 hours  You are dizzy or lightheaded, or you feel like you may faint.  You have a fever; a temperature of 101.0 F or greater  Chills  Difficulty urinating  Headache unrelieved by "pain meds"   Visual changes  Pain in the right side of your belly near your ribs  Breasts reddened, hard, hot to the touch or any other breast concerns  Nipple discharge which is foul-smelling or contains pus   New pain unrelieved with recommended over-the-counter dosages  Difficulty breathing with or without chest pain   New leg pain, swelling, or redness, especially if it is only on one leg  Any other concerns  Watch closely for changes in your health, and be sure to contact your provider if:  You have new or worse vaginal discharge.  You feel sad or depressed.  You are having problems with your breasts or breastfeeding.    

## 2020-01-27 NOTE — Discharge Summary (Signed)
Obstetric Discharge Summary   Patient Name: Tiffany Whitehead DOB: Oct 09, 1990 MRN: [id number]  Date of Admission: 01/26/2020 Date of Delivery: 01/27/2020 Delivered by: [name], CNM Date of Discharge: 01/28/2020  Primary OB: [facility]  VVO:[id number] last menstrual period was 04/01/2019. EDC Estimated Date of Delivery: 02/09/20 Gestational Age at Delivery: [redacted]w[redacted]d   Antepartum complications:   1. ADHD 2. Generalized anxiety disorder  3. History of postpartum depression 4. Bicornate uterus? 5. History of macrosomia  6. Physiologic anemia of pregnancy    Admitting Diagnosis: labor  Secondary Diagnoses: Patient Active Problem List   Diagnosis Date Noted  . Uterine contractions during pregnancy 01/26/2020  . Non-reactive NST (non-stress test) 01/26/2020  . Pelvic pain in pregnancy 01/06/2020  . Encounter for suspected PROM, with rupture of membranes not found 12/10/2019  . UTI (urinary tract infection) during pregnancy 11/21/2019  . Encounter for supervision of other normal pregnancy, third trimester 07/10/2019  . Anxiety 11/22/2017  . GAD (generalized anxiety disorder) 07/04/2015  . ADHD (attention deficit hyperactivity disorder), combined type 07/04/2015  . Family history of colon cancer 07/04/2015  . Lactose intolerance 07/04/2015  . Transient gluten sensitivity 07/04/2015    Augmentation: AROM Complications: None Intrapartum complications/course: She presented to L&D with contractions. She was augmented with AROM. Epidural placed for pain relief. She progressed to complete and had a spontaneous vaginal birth of a live female, pushing over an intact perineum. The fetal head was delivered in direct OA position with restitution to LOA. No nuchal cord. Anterior then posterior shoulders delivered with minimal assistance. Baby placed on mom's abdomen and attended to by transition RN. Cord clamped and cut when pulseless by father of the baby, Micah. Placenta delivered  spontaneously intact. Uterine tone firm / bleeding scant. IV pitocin given for hemorrhage prophylaxis. No laceration identified. Cloretta Ned. Blood Loss (mL): 320.   Delivery Type: spontaneous vaginal delivery Anesthesia: epidural Placenta: spontaneous Laceration: none Episiotomy: none  Newborn Data: Live born female  Birth Weight: 8lb 3630g APGAR: 8, 9  Newborn Delivery   Birth date/time: 01/27/2020 09:58:00 Delivery type: Vaginal, Spontaneous       Postpartum Course  Patient had an uncomplicated postpartum course.  By time of discharge on PPD#1, her pain was controlled on oral pain medications; she had appropriate lochia and was ambulating, voiding without difficulty and tolerating regular diet.  She was deemed stable for discharge to home.       Edinburgh:  No flowsheet data found.   Labs: CBC Latest Ref Rng & Units 01/28/2020 01/26/2020 11/21/2019  WBC 4.0 - 10.5 K/uL 20.7(H) 18.6(H) 10.3  Hemoglobin 12.0 - 15.0 g/dL 6.2(I) 9.4(W) 5.4(O)  Hematocrit 36 - 46 % 29.0(L) 27.8(L) 28.9(L)  Platelets 150 - 400 K/uL 213 205 182   A POS  Physical exam:  BP 115/67 (BP Location: Left Arm)   Pulse 67   Temp 98.3 F (36.8 C) (Oral)   Resp 20   Ht 5\' 8"  (1.727 m)   Wt 81.6 kg   LMP 04/01/2019   SpO2 98%   Breastfeeding Unknown   BMI 27.37 kg/m  General: alert and no distress Pulm: normal respiratory effort Lochia: appropriate Abdomen: soft, NT Uterine Fundus: firm, below umbilicus Extremities: No evidence of DVT seen on physical exam. No lower extremity edema.   Disposition: stable, discharge to home Baby Feeding: breastmilk Baby Disposition: home with mom  Contraception: partner vasectomy  Prenatal Labs:  Blood type/Rh A+  Antibody screen neg  Rubella Immune  Varicella Immune  RPR NR  HBsAg Neg  HIV NR  GC neg  Chlamydia neg  Genetic screening negative  1 hour GTT 102  3 hour GTT n/a  GBS negative   Rh Immune globulin given: n/a Rubella vaccine given:  n/a Varicella vaccine given: n/a Tdap vaccine given in AP or PP setting: 11/17/2019  Plan:  [place] was discharged to home in good condition. Follow-up appointment at [facility] with delivery provider in 6 weeks  Discharge Instructions: Per After Visit Summary. Activity: Advance as tolerated. Pelvic rest for 6 weeks.   Diet: Regular Discharge Medications: Allergies as of 01/28/2020      Reactions   Ceclor [cefaclor] Rash      Medication List    STOP taking these medications   acetaminophen 325 MG tablet Commonly known as: TYLENOL   cetirizine 10 MG tablet Commonly known as: ZYRTEC   cyclobenzaprine 5 MG tablet Commonly known as: FLEXERIL   ondansetron 8 MG disintegrating tablet Commonly known as: ZOFRAN-ODT   promethazine 25 MG suppository Commonly known as: PHENERGAN   promethazine 25 MG tablet Commonly known as: PHENERGAN     TAKE these medications   clonazePAM 0.5 MG tablet Commonly known as: KLONOPIN Take by mouth 2 (two) times daily as needed for anxiety.   ferrous sulfate 325 (65 FE) MG tablet Take 1 tablet (325 mg total) by mouth daily.   hydrOXYzine 25 MG tablet Commonly known as: ATARAX/VISTARIL Take 25 mg by mouth daily.   ibuprofen 600 MG tablet Commonly known as: ADVIL Take 1 tablet (600 mg total) by mouth every 6 (six) hours as needed for mild pain, moderate pain or cramping.   loratadine 10 MG tablet Commonly known as: CLARITIN Take 10 mg by mouth daily.   multivitamin-prenatal 27-0.8 MG Tabs tablet Take 1 tablet by mouth daily.   sertraline 100 MG tablet Commonly known as: ZOLOFT Take 200 mg by mouth at bedtime.      Outpatient follow up:   Follow-up Information    [name], CNM. Schedule an appointment as soon as possible for a visit in 6 week(s).   Specialty: Certified Nurse Midwife Why: For routine postpartum visit Contact information: [address] [place] [address] [phone]                 Signed: [name], CNM 01/28/2020 12:47 PM

## 2020-01-27 NOTE — Anesthesia Procedure Notes (Signed)
Epidural Patient location during procedure: OB Start time: 01/27/2020 6:00 AM End time: 01/27/2020 6:29 AM  Staffing Anesthesiologist: Corinda Gubler, MD Performed: anesthesiologist   Preanesthetic Checklist Completed: patient identified, IV checked, site marked, risks and benefits discussed, surgical consent, monitors and equipment checked, pre-op evaluation and timeout performed  Epidural Patient position: sitting Prep: ChloraPrep Patient monitoring: heart rate, continuous pulse ox and blood pressure Approach: midline Location: L3-L4 Injection technique: LOR saline  Needle:  Needle type: Tuohy  Needle gauge: 17 G Needle length: 9 cm and 9 Needle insertion depth: 5.5 cm Catheter type: closed end flexible Catheter size: 19 Gauge Catheter at skin depth: 11 cm Test dose: negative and 1.5% lidocaine with Epi 1:200 K  Assessment Sensory level: T10 Events: blood not aspirated, injection not painful, no injection resistance, no paresthesia and negative IV test  Additional Notes one attempt Pt. Evaluated and documentation done after procedure finished. Patient identified. Risks/Benefits/Options discussed with patient including but not limited to bleeding, infection, nerve damage, paralysis, failed block, incomplete pain control, headache, blood pressure changes, nausea, vomiting, reactions to medication both or allergic, itching and postpartum back pain. Confirmed with bedside nurse the patient's most recent platelet count. Confirmed with patient that they are not currently taking any anticoagulation, have any bleeding history or any family history of bleeding disorders. Patient expressed understanding and wished to proceed. All questions were answered. Sterile technique was used throughout the entire procedure. Please see nursing notes for vital signs. Test dose was given through epidural catheter and negative prior to continuing to dose epidural or start infusion. Warning signs of high  block given to the patient including shortness of breath, tingling/numbness in hands, complete motor block, or any concerning symptoms with instructions to call for help. Patient was given instructions on fall risk and not to get out of bed. All questions and concerns addressed with instructions to call with any issues or inadequate analgesia.   Patient tolerated the insertion well without immediate complications.Reason for block:procedure for pain

## 2020-01-27 NOTE — Lactation Note (Signed)
This note was copied from a baby's chart. Lactation Consultation Note  Patient Name: Tiffany Whitehead [id number] Date: 01/27/2020 Reason for consult: Initial assessment;Mother's request;Early term 37-38.6wks;Other (Comment) (Mom had doula)  Observed mom breast feeding with strong, rhythmic sucking with occasional swallow.  Demonstrated hand expression.  Mom anxious with lots of questions which were addressed.  Mom only breast fed her first for 2 weeks.  Mom admits giving up too soon with first baby, but reports wanting to breast feed longer with this baby.  Baby wanting to breast feed frequently which concerns mom that he is getting enough milk from her.  FOB finally got him to settle down and sleep.  Reviewed normal newborn stomach size, supply and demand, normal course of lactation and routine newborn feeding patterns.  Explained feeding cues and encouraged mom to put him to the breast whenever her demonstrated hunger cues.  Lactation name and number written on white board and encouraged to call with any questions, concerns or assistance.   Maternal Data Formula Feeding for Exclusion: No Has patient been taught Hand Expression?: Yes Does the patient have breastfeeding experience prior to this delivery?: Yes  Feeding Feeding Type: Breast Fed  LATCH Score Latch: Grasps breast easily, tongue down, lips flanged, rhythmical sucking. (per MOB)  Audible Swallowing: Spontaneous and intermittent  Type of Nipple: Everted at rest and after stimulation  Comfort (Breast/Nipple): Soft / non-tender  Hold (Positioning): No assistance needed to correctly position infant at breast.  LATCH Score: 10  Interventions Interventions: Breast feeding basics reviewed;Assisted with latch;Skin to skin;Breast massage;Hand express;Breast compression;Adjust position;Support pillows;Position options  Lactation Tools Discussed/Used WIC Program: Yes   Consult Status Consult Status: PRN    Louis Meckel 01/27/2020, 5:27 PM

## 2020-01-27 NOTE — Progress Notes (Signed)
Labor Progress Note  Tiffany Whitehead is a 29 y.o. G3P1011 at [redacted]w[redacted]d by LMP admitted for early labor, cat II tracing.  Called to room by RN to discuss augmentation with oxytocin.  Tiffany Whitehead reports that contractions feel more intense and would like to try AROM instead of oxytocin.   Subjective: contractions feeling closer and more intense   Objective: BP 118/77 (BP Location: Left Arm)   Pulse 92   Temp 98.7 F (37.1 C) (Oral)   Resp 18   Ht 5\' 8"  (1.727 m)   Wt 81.6 kg   LMP 04/01/2019   BMI 27.37 kg/m  Notable VS details: reviewed   Fetal Assessment: FHT:  FHR: 145 bpm, variability: moderate,  accelerations:  Present,  decelerations:  Absent Category/reactivity:  Category I UC:   regular, every 4-6 minutes SVE:   5/80/-1/soft/anterior Membrane status: Bulging BOW Amniotic color: NA  Labs: Lab Results  Component Value Date   WBC 18.6 (H) 01/26/2020   HGB 9.1 (L) 01/26/2020   HCT 27.8 (L) 01/26/2020   MCV 88.0 01/26/2020   PLT 205 01/26/2020    Assessment / Plan: Spontaneous labor, progressing normally  Labor: Progressing with expectant management.  Discussed optoins for continued expectant management, AROM, and/or oxytocin.  Will get epidural per patient request and AROM after epidural. Preeclampsia:  no s/s Fetal Wellbeing:  Category I Pain Control:  Epidural requested I/D:  n/a   03/27/2020, CNM 01/27/2020, 6:05 AM

## 2020-01-27 NOTE — Progress Notes (Signed)
Labor Progress Note  Tiffany Whitehead is a 29 y.o. G3P1011 at [redacted]w[redacted]d by LMP admitted for early labor and cat II tracing   Subjective: reports contractions feel more uncomfortable.  States IVPM is helping.   Objective: BP 118/77 (BP Location: Left Arm)   Pulse 92   Temp 98.7 F (37.1 C) (Oral)   Resp 18   Ht 5\' 8"  (1.727 m)   Wt 81.6 kg   LMP 04/01/2019   BMI 27.37 kg/m  Notable VS details: reviewed   Fetal Assessment: FHT:  FHR: 145 bpm, variability: moderate,  accelerations:  Present,  decelerations:  Present intermittent variables Category/reactivity:  Category II UC:   irregular, every 4-8 minutes SVE:   4/80/-2 by RN Membrane status: Intact Amniotic color: N/A  Labs: Lab Results  Component Value Date   WBC 18.6 (H) 01/26/2020   HGB 9.1 (L) 01/26/2020   HCT 27.8 (L) 01/26/2020   MCV 88.0 01/26/2020   PLT 205 01/26/2020    Assessment / Plan: Early labor, minimal cervical change overnight.   Labor: Will start oxytocin for augmentation.  Preeclampsia:  no s/s Fetal Wellbeing:  Category II - overall reassuring with moderate variability and accels  Pain Control:  IV pain meds I/D:  n/a  03/27/2020, CNM 01/27/2020, 5:02 AM

## 2020-01-27 NOTE — Anesthesia Preprocedure Evaluation (Signed)
Anesthesia Evaluation  Patient identified by MRN, date of birth, ID band Patient awake    Reviewed: Allergy & Precautions, H&P , NPO status , Patient's Chart, lab work & pertinent test results  History of Anesthesia Complications Negative for: history of anesthetic complications  Airway Mallampati: III  TM Distance: >3 FB Neck ROM: full    Dental no notable dental hx. (+) Teeth Intact   Pulmonary neg pulmonary ROS, neg sleep apnea, neg COPD, Patient abstained from smoking.Not current smoker,    Pulmonary exam normal breath sounds clear to auscultation       Cardiovascular Exercise Tolerance: Good METS(-) hypertension(-) CAD and (-) Past MI negative cardio ROS  (-) dysrhythmias  Rhythm:Regular Rate:Normal - Systolic murmurs    Neuro/Psych PSYCHIATRIC DISORDERS Anxiety Depression negative neurological ROS     GI/Hepatic negative GI ROS, neg GERD  ,(+)     (-) substance abuse  ,   Endo/Other  neg diabetes  Renal/GU negative Renal ROS  negative genitourinary   Musculoskeletal   Abdominal   Peds  Hematology negative hematology ROS (+)   Anesthesia Other Findings Past Medical History: No date: Anxiety No date: Depression No date: H/O borderline personality disorder     Comment:  stopped meds at beginning of pregnancy, Lamictal and               Vyvanse No date: Kidney stone No date: UTI (urinary tract infection)  Past Surgical History: No date: COLONOSCOPY No date: extraction of wisdom teeth  BMI    Body Mass Index:  25.85 kg/m      Reproductive/Obstetrics (+) Pregnancy                             Anesthesia Physical  Anesthesia Plan  ASA: II  Anesthesia Plan: Epidural   Post-op Pain Management:    Induction:   PONV Risk Score and Plan: 3 and Treatment may vary due to age or medical condition and Ondansetron  Airway Management Planned: Natural Airway  Additional  Equipment:   Intra-op Plan:   Post-operative Plan:   Informed Consent: I have reviewed the patients History and Physical, chart, labs and discussed the procedure including the risks, benefits and alternatives for the proposed anesthesia with the patient or authorized representative who has indicated his/her understanding and acceptance.       Plan Discussed with: Anesthesiologist  Anesthesia Plan Comments: (Discussed R/B/A of neuraxial anesthesia technique with patient: - rare risks of spinal/epidural hematoma, nerve damage, infection - Risk of PDPH - Risk of itching - Risk of nausea and vomiting - Risk of poor block necessitating replacement of epidural. Patient voiced understanding.)        Anesthesia Quick Evaluation

## 2020-01-28 LAB — CBC
HCT: 29 % — ABNORMAL LOW (ref 36.0–46.0)
Hemoglobin: 9.8 g/dL — ABNORMAL LOW (ref 12.0–15.0)
MCH: 29.3 pg (ref 26.0–34.0)
MCHC: 33.8 g/dL (ref 30.0–36.0)
MCV: 86.8 fL (ref 80.0–100.0)
Platelets: 213 10*3/uL (ref 150–400)
RBC: 3.34 MIL/uL — ABNORMAL LOW (ref 3.87–5.11)
RDW: 14.5 % (ref 11.5–15.5)
WBC: 20.7 10*3/uL — ABNORMAL HIGH (ref 4.0–10.5)
nRBC: 0 % (ref 0.0–0.2)

## 2020-01-28 MED ORDER — FERROUS SULFATE 325 (65 FE) MG PO TABS: 325.0000 mg | ORAL_TABLET | Freq: Every day | ORAL | 0 refills | Status: AC

## 2020-01-28 MED ORDER — IBUPROFEN 600 MG PO TABS: 600.0000 mg | ORAL_TABLET | Freq: Four times a day (QID) | ORAL | 0 refills | Status: AC | PRN

## 2020-01-28 NOTE — TOC Initial Note (Signed)
Transition of Care [facility]) - Initial/Assessment Note    Patient Details  Name: Tiffany Whitehead MRN: [id number] Date of Birth: November 15, 1990  [facility]) CM/SW Contact:    Marina Goodell Phone Number: [phone] 01/28/2020, 1:19 PM  Clinical Narrative:                  CW spoke with patient. Patient stated she didn't feel well during her pregnancy but has felt much better since delivery.  Patient stated she has support systems in place and is not in need of community resources.  CSW inquired if patient was going to see a doctor for a follow-up appointment for herself and the baby.  Patient stated yes she had follow-up appointments set up.  CSW asked patient if she had any questions before discharge to contact CSW, patient verbalized understanding.  CSW contact RN, and updated on patient status.  TOC consult complete.       Patient Goals and CMS Choice        Expected Discharge Plan and Services           Expected Discharge Date: 01/28/20                                    Prior Living Arrangements/Services                       Activities of Daily Living Home Assistive Devices/Equipment: None ADL Screening (condition at time of admission) Patient's cognitive ability adequate to safely complete daily activities?: Yes Is the patient deaf or have difficulty hearing?: No Does the patient have difficulty seeing, even when wearing glasses/contacts?: No Does the patient have difficulty concentrating, remembering, or making decisions?: No Patient able to express need for assistance with ADLs?: Yes Does the patient have difficulty dressing or bathing?: No Independently performs ADLs?: Yes (appropriate for developmental age) Does the patient have difficulty walking or climbing stairs?: No Weakness of Legs: None Weakness of Arms/Hands: None  Permission Sought/Granted                  Emotional Assessment              Admission  diagnosis:  Uterine contractions during pregnancy [O62.2] Non-reactive NST (non-stress test) [O28.8] Patient Active Problem List   Diagnosis Date Noted  . Uterine contractions during pregnancy 01/26/2020  . Non-reactive NST (non-stress test) 01/26/2020  . Pelvic pain in pregnancy 01/06/2020  . Encounter for suspected PROM, with rupture of membranes not found 12/10/2019  . UTI (urinary tract infection) during pregnancy 11/21/2019  . Encounter for supervision of other normal pregnancy, third trimester 07/10/2019  . Anxiety 11/22/2017  . GAD (generalized anxiety disorder) 07/04/2015  . ADHD (attention deficit hyperactivity disorder), combined type 07/04/2015  . Family history of colon cancer 07/04/2015  . Lactose intolerance 07/04/2015  . Transient gluten sensitivity 07/04/2015   PCP:  Rolm Gala, MD Pharmacy:   [facility] - [place], [place] - [address] [place] [address] Phone: [phone] Fax: [phone]     Social Determinants of Health (SDOH) Interventions    Readmission Risk Interventions No flowsheet data found.

## 2020-01-28 NOTE — Progress Notes (Signed)
Post Partum Day 1  Subjective: Doing well, no concerns. Ambulating without difficulty, pain managed with PO meds, tolerating regular diet, and voiding without difficulty.   No fever/chills, chest pain, shortness of breath, nausea/vomiting, or leg pain. No nipple or breast pain.   Objective: BP 115/67 (BP Location: Left Arm)   Pulse 67   Temp 98.3 F (36.8 C) (Oral)   Resp 20   Ht 5\' 8"  (1.727 m)   Wt 81.6 kg   LMP 04/01/2019   SpO2 98%   Breastfeeding Unknown   BMI 27.37 kg/m    Physical Exam:  General: alert, cooperative, appears stated age and no distress Breasts: soft/nontender CV: RRR Pulm: nl effort, CTABL Abdomen: soft, non-tender, active bowel sounds Uterine Fundus: firm Lochia: appropriate DVT Evaluation: No evidence of DVT seen on physical exam. No cords or calf tenderness. No significant calf/ankle edema.  Recent Labs    01/26/20 2239 01/28/20 0740  HGB 9.1* 9.8*  HCT 27.8* 29.0*  WBC 18.6* 20.7*  PLT 205 213    Assessment/Plan: 29 y.o. 38 postpartum day # 1  -Continue routine postpartum care -Lactation consult PRN for breastfeeding  -Iron deficiency anemia - hemodynamically stable and asymptomatic; continue PO ferrous sulfate BID with stool softeners  -Immunization status: all immunizations up to date -Leukocytosis: afebrile, uterus non-tender to palpation  Disposition: Desires discharge home today if baby cleared for discharge   LOS: 2 days   [id number], CNM 01/28/2020, 8:26 AM   ----- 03/29/2020 Certified Nurse Midwife [facility]

## 2020-01-28 NOTE — Progress Notes (Signed)
Discharge instructions, prescriptions, education, and appointments given and explained. Pt verbalized understanding with no further questions. Pt wheeled to personal vehicle via staff 

## 2020-01-29 NOTE — Anesthesia Postprocedure Evaluation (Signed)
Anesthesia Post Note  Patient: KAYDE ATKERSON  Procedure(s) Performed: AN AD HOC LABOR EPIDURAL  Patient location during evaluation: Mother Baby Anesthesia Type: Epidural Level of consciousness: awake and alert Pain management: pain level controlled Vital Signs Assessment: post-procedure vital signs reviewed and stable Respiratory status: spontaneous breathing, nonlabored ventilation and respiratory function stable Cardiovascular status: stable Postop Assessment: no headache, no backache and epidural receding Anesthetic complications: no   No complications documented.   Last Vitals: There were no vitals filed for this visit.  Last Pain: There were no vitals filed for this visit.               Corinda Gubler

## 2020-04-29 ENCOUNTER — Emergency Department
Admission: EM | Admit: 2020-04-29 | Discharge: 2020-04-29 | Disposition: A | Discharge status: Home / Self Care | Payer: 59 | Attending: Emergency Medicine | Admitting: Emergency Medicine

## 2020-04-29 ENCOUNTER — Emergency Department: Payer: 59

## 2020-04-29 ENCOUNTER — Other Ambulatory Visit: Payer: 59

## 2020-04-29 ENCOUNTER — Other Ambulatory Visit: Payer: Self-pay

## 2020-04-29 DIAGNOSIS — R079 Chest pain, unspecified: Secondary | ICD-10-CM | POA: Insufficient documentation

## 2020-04-29 DIAGNOSIS — R531 Weakness: Secondary | ICD-10-CM | POA: Insufficient documentation

## 2020-04-29 DIAGNOSIS — Z20822 Contact with and (suspected) exposure to covid-19: Secondary | ICD-10-CM | POA: Insufficient documentation

## 2020-04-29 DIAGNOSIS — R202 Paresthesia of skin: Secondary | ICD-10-CM | POA: Insufficient documentation

## 2020-04-29 DIAGNOSIS — F419 Anxiety disorder, unspecified: Secondary | ICD-10-CM | POA: Diagnosis not present

## 2020-04-29 LAB — CBC
HCT: 36.1 % (ref 36.0–46.0)
Hemoglobin: 11.8 g/dL — ABNORMAL LOW (ref 12.0–15.0)
MCH: 29.9 pg (ref 26.0–34.0)
MCHC: 32.7 g/dL (ref 30.0–36.0)
MCV: 91.6 fL (ref 80.0–100.0)
Platelets: 246 10*3/uL (ref 150–400)
RBC: 3.94 MIL/uL (ref 3.87–5.11)
RDW: 14.2 % (ref 11.5–15.5)
WBC: 7.5 10*3/uL (ref 4.0–10.5)
nRBC: 0 % (ref 0.0–0.2)

## 2020-04-29 LAB — COMPREHENSIVE METABOLIC PANEL
ALT: 17 U/L (ref 0–44)
AST: 22 U/L (ref 15–41)
Albumin: 4 g/dL (ref 3.5–5.0)
Alkaline Phosphatase: 70 U/L (ref 38–126)
Anion gap: 8 (ref 5–15)
BUN: 13 mg/dL (ref 6–20)
CO2: 26 mmol/L (ref 22–32)
Calcium: 8.9 mg/dL (ref 8.9–10.3)
Chloride: 107 mmol/L (ref 98–111)
Creatinine, Ser: 0.72 mg/dL (ref 0.44–1.00)
GFR, Estimated: >60 mL/min (ref >60)
Glucose, Bld: 110 mg/dL — ABNORMAL HIGH (ref 70–99)
Potassium: 4.1 mmol/L (ref 3.5–5.1)
Sodium: 141 mmol/L (ref 135–145)
Total Bilirubin: 0.7 mg/dL (ref 0.3–1.2)
Total Protein: 7.3 g/dL (ref 6.5–8.1)

## 2020-04-29 LAB — RESP PANEL BY RT-PCR (FLU A&B, COVID) ARPGX2
Influenza A by PCR: NEGATIVE
Influenza B by PCR: NEGATIVE
SARS Coronavirus 2 by RT PCR: NEGATIVE

## 2020-04-29 LAB — POC URINE PREG, ED: Preg Test, Ur: NEGATIVE

## 2020-04-29 LAB — TROPONIN I (HIGH SENSITIVITY): Troponin I (High Sensitivity): <2 ng/L (ref <18)

## 2020-04-29 IMAGING — CT CT HEAD W/O CM
3 series · 16 of 47 positions shown, 19 images · non-contrast
Comparison: [DATE]

CLINICAL DATA: Weakness and altered mental status

EXAM:
CT HEAD WITHOUT CONTRAST
TECHNIQUE: Contiguous axial images were obtained from the base of the skull
through the vertex without intravenous contrast.

[Series 3: head wo · axial · 0.41mm/px · z∈[-165,-35]mm · 10 of 32 slices shown, 13 images]
[im 3/32  brain]
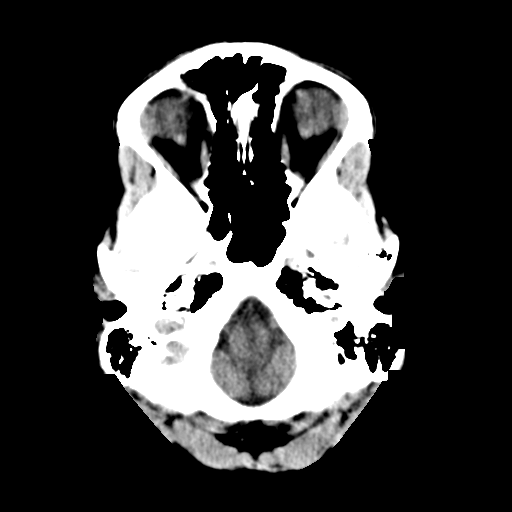
[im 3/32  bone]
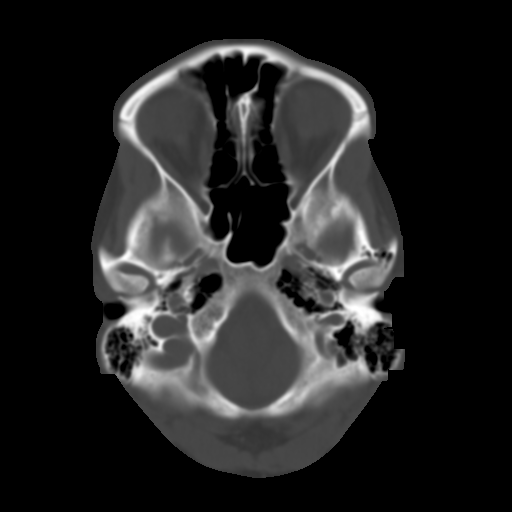
[im 6/32  brain]
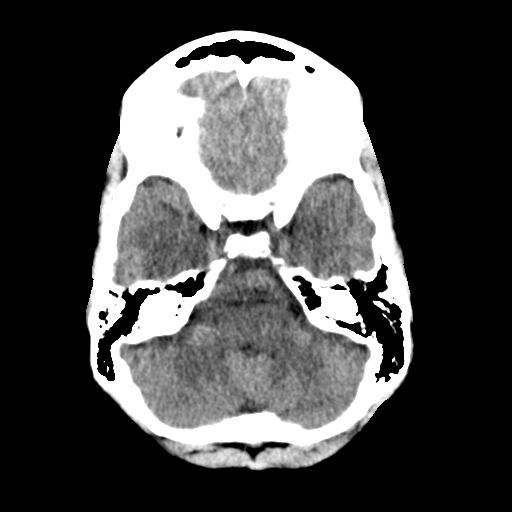
[im 9/32  brain]
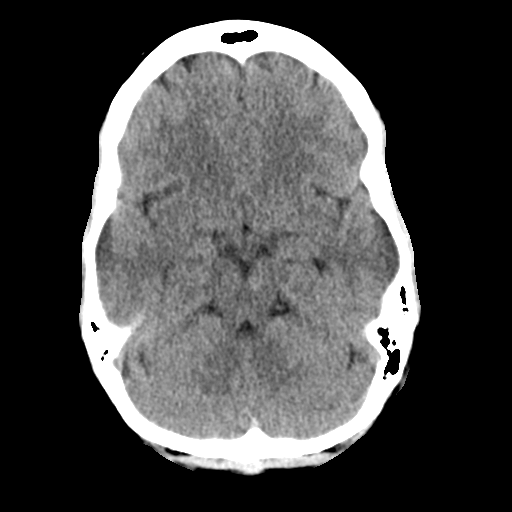
[im 11/32  brain]
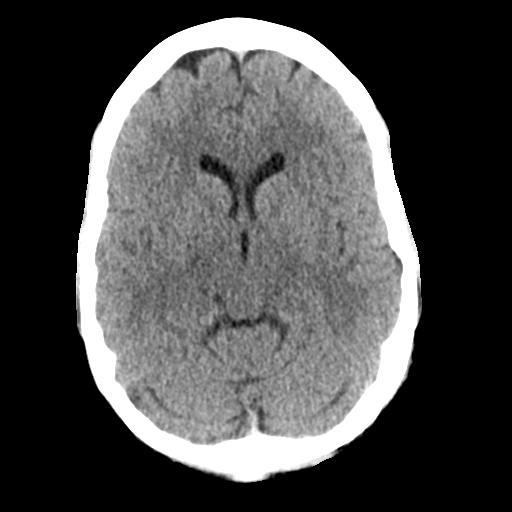
[im 14/32  brain]
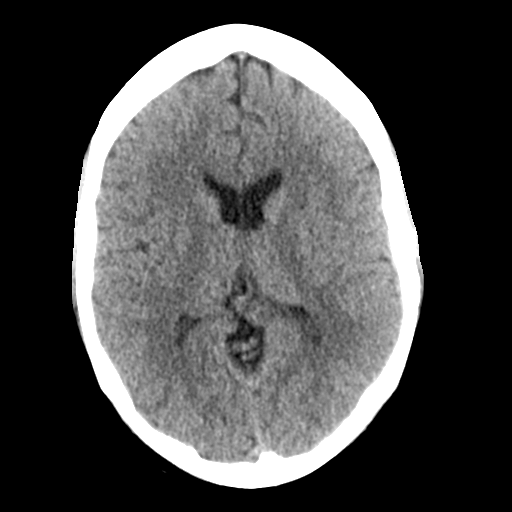
[im 14/32  bone]
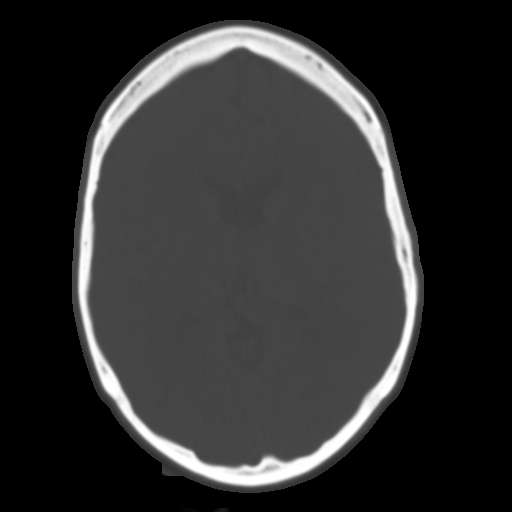
[im 18/32  brain]
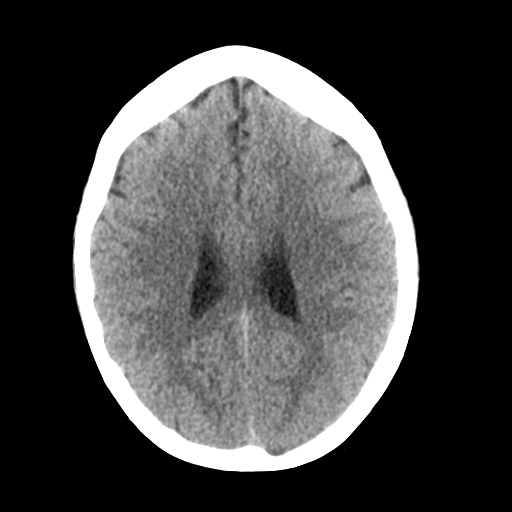
[im 21/32  brain]
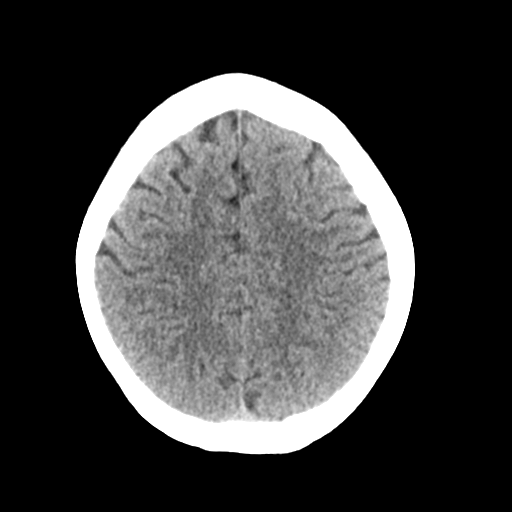
[im 24/32  brain]
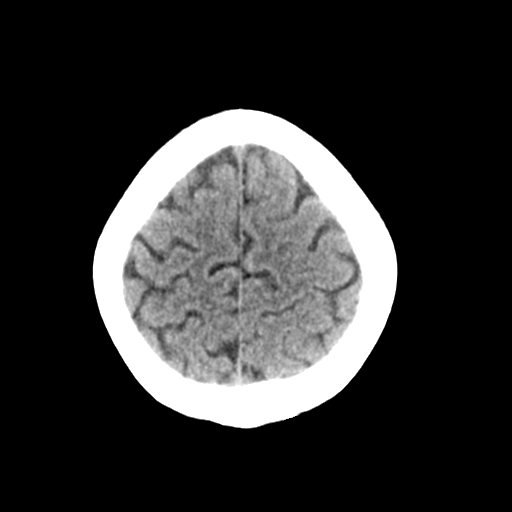
[im 26/32  brain]
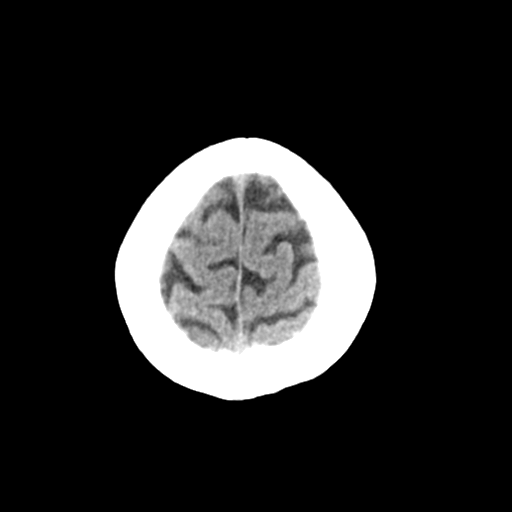
[im 26/32  bone]
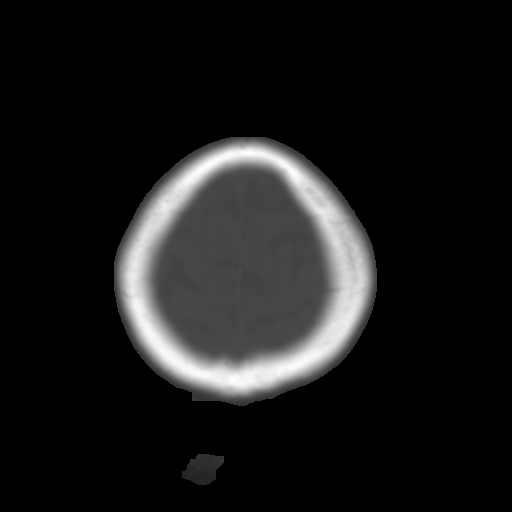
[im 29/32  brain]
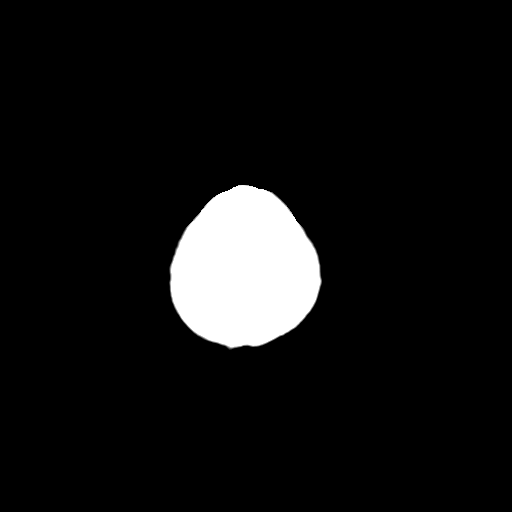

[Series 4: coronal soft tissue · coronal · 0.34mm/px · 3 of 67 slices shown]
[im 23/67  brain]
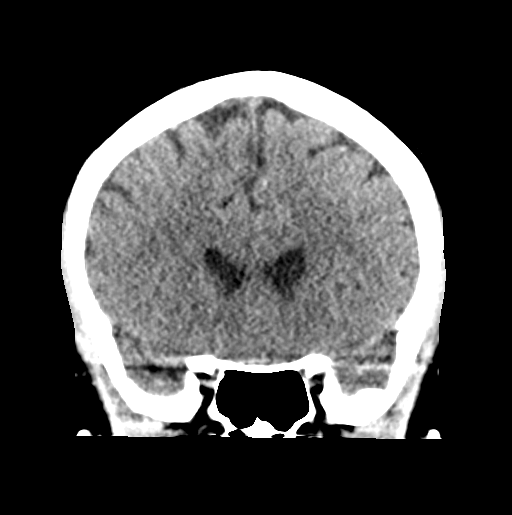
[im 30/67  brain]
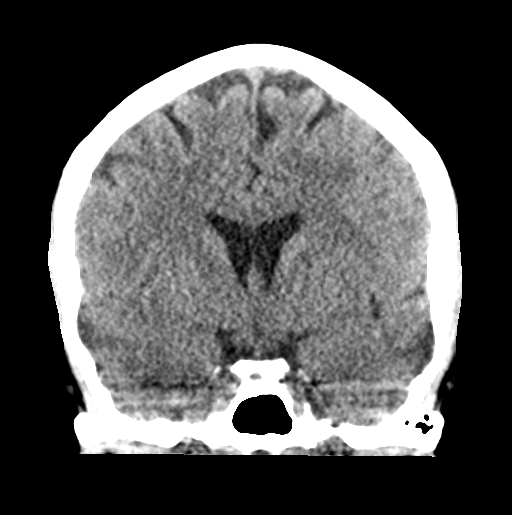
[im 37/67  brain]
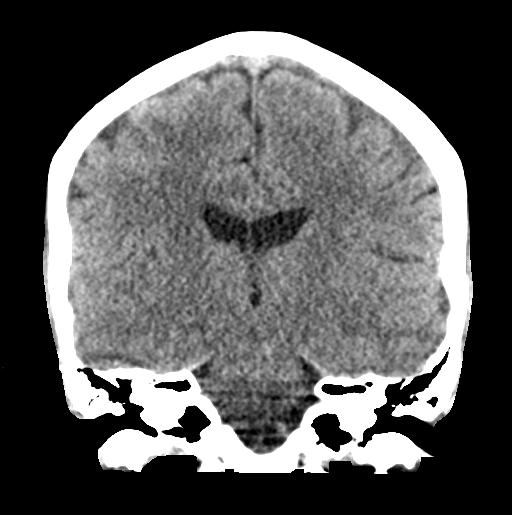

[Series 5: sagittal soft tissue · sagittal · 0.34mm/px · 3 of 55 slices shown]
[im 19/55  brain]
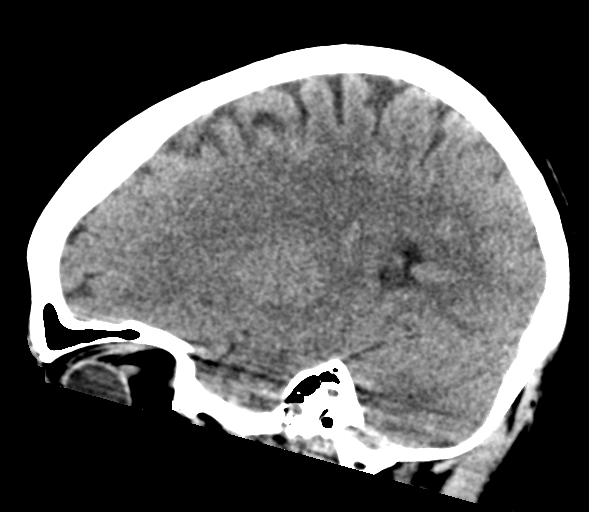
[im 28/55  brain]
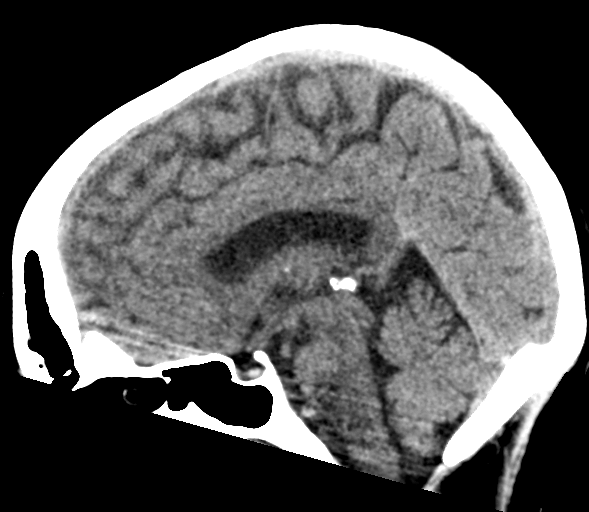
[im 37/55  brain]
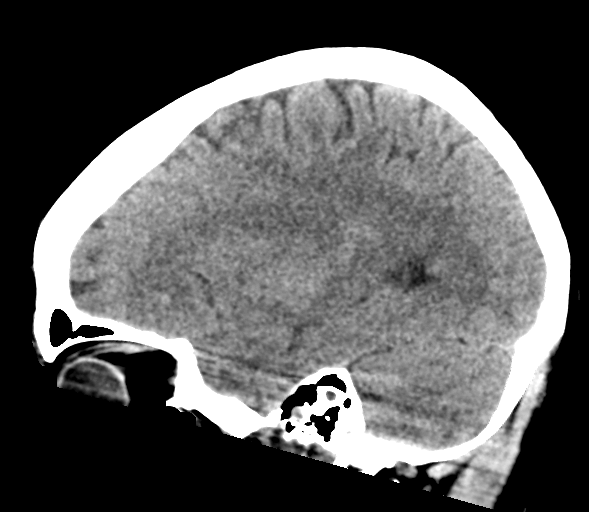

[16 of 47 positions shown; findings below may reference images not displayed]

FINDINGS: Brain: Ventricles and sulci are normal in size and configuration.
There is no intracranial mass, hemorrhage, extra-axial fluid
collection, or midline shift. Brain parenchyma appears unremarkable.
No evident acute infarct.

Vascular: No evident hyperdense vessel. No appreciable vascular
calcification.

Skull: The bony calvarium appears intact.

Sinuses/Orbits: Visualized paranasal sinuses are clear. Visualized
orbits appear symmetric bilaterally.

Other: Visualized mastoid air cells are clear.
IMPRESSION: Study within normal limits.

## 2020-04-29 IMAGING — DX DG CHEST 1V PORT
1 series · 1 of 1 positions shown · non-contrast
Comparison: Chest x-ray [DATE].

CLINICAL DATA: 29-year-old female with history of weakness.

EXAM:
PORTABLE CHEST 1 VIEW

[chest ap]
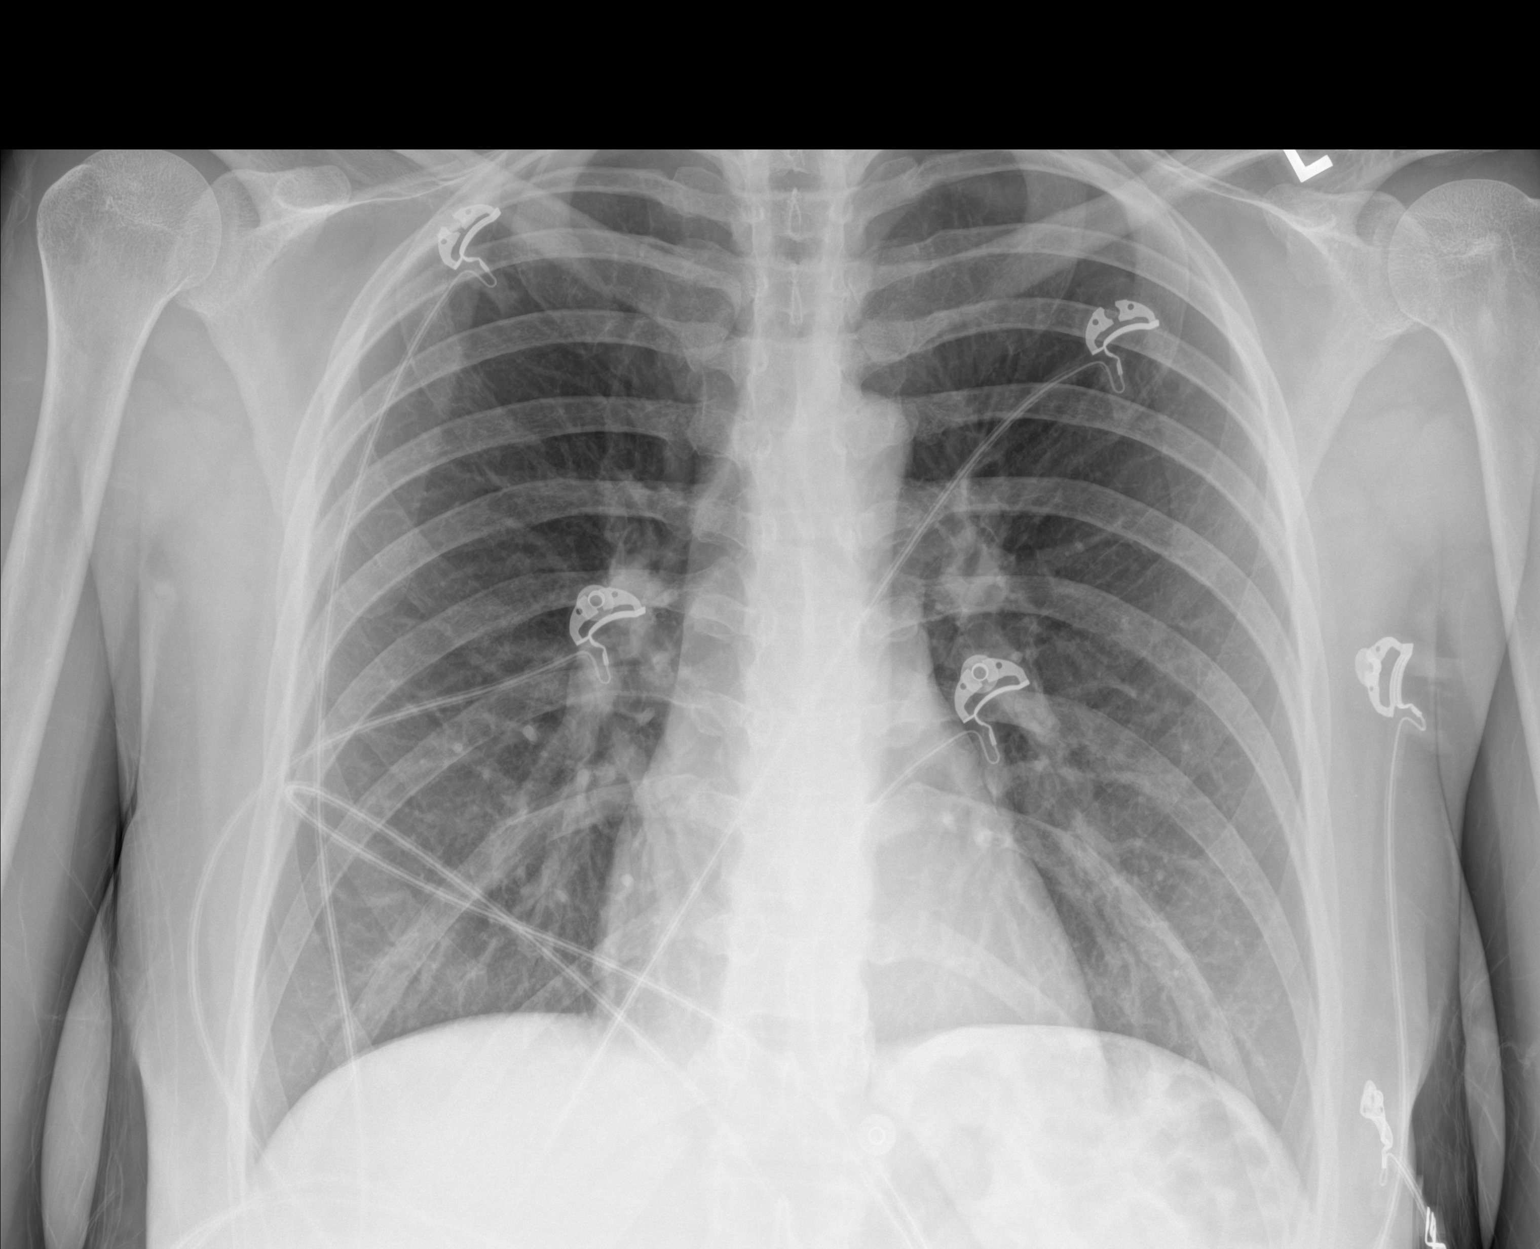

[1 of 1 positions shown; findings below may reference images not displayed]

FINDINGS: Lung volumes are normal. No consolidative airspace disease. No
pleural effusions. No pneumothorax. No pulmonary nodule or mass
noted. Pulmonary vasculature and the cardiomediastinal silhouette
are within normal limits.
IMPRESSION: No radiographic evidence of acute cardiopulmonary disease.

## 2020-04-29 IMAGING — MR MR MRV HEAD W/O CM
1 of 2 series · 28 of 48 positions shown · non-contrast
Comparison: Plain head CT at [S5] hours today.

CLINICAL DATA: 29-year-old female is 12 weeks postpartum. Weakness
and altered mental status. Neurologic deficit.

EXAM:
MRI HEAD WITHOUT CONTRAST
MRA HEAD WITHOUT CONTRAST
MRV HEAD WITHOUT CONTRAST
TECHNIQUE: Multiplanar, multiecho pulse sequences of the brain and surrounding
structures were obtained without intravenous contrast. Angiographic
images of the Circle of Willis were obtained using MRA technique
without intravenous contrast. Angiographic images of the head using
MRV technique without contrast.

[Series 5: TOF · coronal · 2.5mm · 0.95mm/px · 28 of 126 slices shown]
[im 1/126]
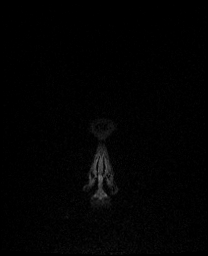
[im 5/126]
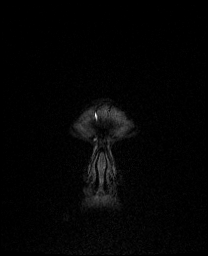
[im 10/126]
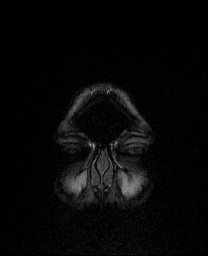
[im 14/126]
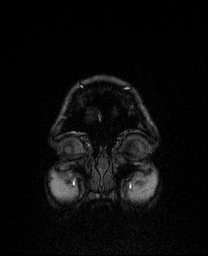
[im 19/126]
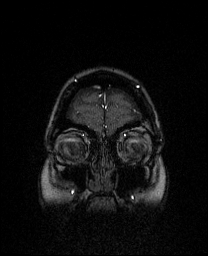
[im 24/126]
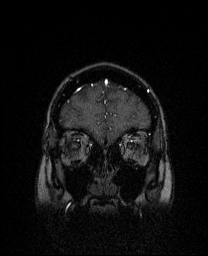
[im 28/126]
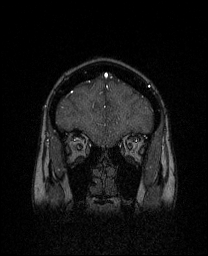
[im 33/126]
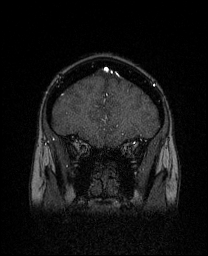
[im 38/126]
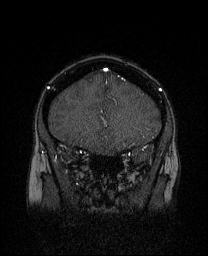
[im 42/126]
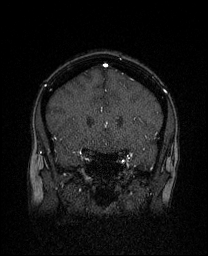
[im 47/126]
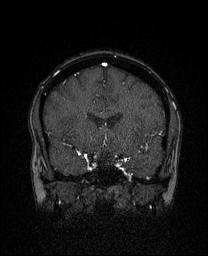
[im 51/126]
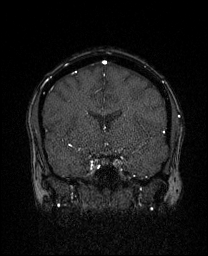
[im 56/126]
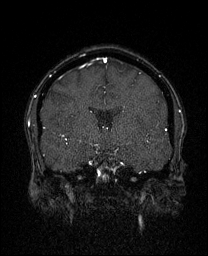
[im 61/126]
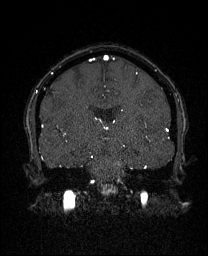
[im 65/126]
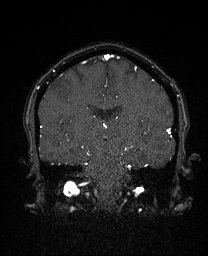
[im 70/126]
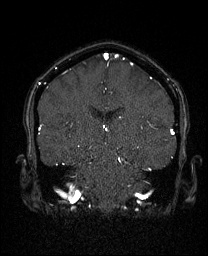
[im 75/126]
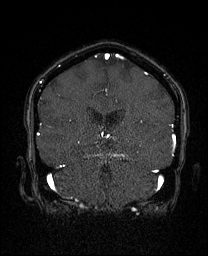
[im 79/126]
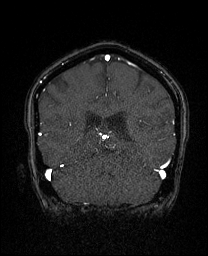
[im 84/126]
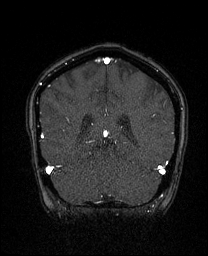
[im 88/126]
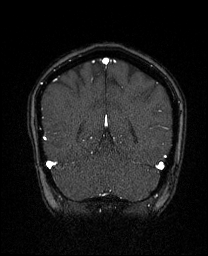
[im 93/126]
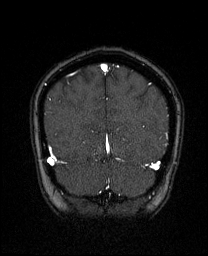
[im 98/126]
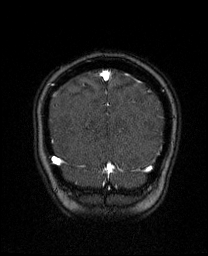
[im 102/126]
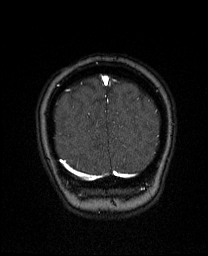
[im 107/126]
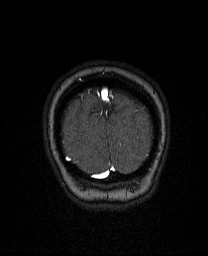
[im 112/126]
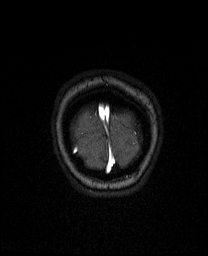
[im 116/126]
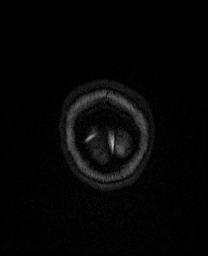
[im 121/126]
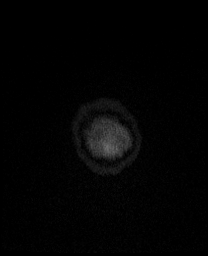
[im 126/126]
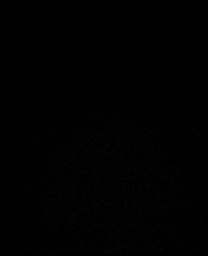

[28 of 48 positions shown; findings below may reference images not displayed]

FINDINGS: MRI HEAD FINDINGS

Brain: Despite repeated imaging techniques some sequences are mildly
motion degraded (axial FLAIR).

Cerebral volume is within normal limits. No restricted diffusion to
suggest acute infarction. No midline shift, mass effect, evidence of
mass lesion, ventriculomegaly, extra-axial collection or acute
intracranial hemorrhage. Cervicomedullary junction and pituitary are
within normal limits.

Gray and white matter signal is within normal limits throughout the
brain. No encephalomalacia or cerebral edema identified. No evidence
of cerebral hemorrhage on susceptibility weighted imaging.

Vascular: Major intracranial vascular flow voids are preserved. The
left vertebral artery appears dominant. See MRA and MRV findings
below.

Skull and upper cervical spine: Visualized bone marrow signal is
within normal limits. Negative visible upper cervical spine.

Sinuses/Orbits: Negative.

Other: Mastoids are clear. Grossly normal visible internal auditory
structures. Scalp and face soft tissues appear negative.

MRA HEAD FINDINGS

Intermittently degraded by motion artifact despite repeated imaging
attempts.

Antegrade flow in the posterior circulation with dominant appearing
distal left vertebral artery. Distal vertebral arteries and basilar
remain patent. Patent right PICA origin is evident. Basilar detail
is limited. But the basilar tip, SCA and PCA origins are patent and
appear within normal limits. Posterior communicating arteries are
present. Bilateral PCA branches are grossly normal.

Antegrade flow in both ICA siphons. ICA detail is limited but both
siphons remain patent to the termini. MCA and ACA origins are patent
and within normal limits. Grossly normal anterior communicating
artery, visible ACA branches. Bilateral MCA M1 segments and MCA
bifurcations are patent. Grossly normal visible bilateral MCA
branches.

MRV HEAD FINDINGS

Coronal 2D and sagittal 3D time-of-flight MRV imaging. Fairly good
image quality on the coronal images.

Preserved flow signal in the superior sagittal sinus, torcula,
straight sinus, vein of [place], internal cerebral veins. Preserved
flow signal in codominant appearing bilateral transverse sinuses,
sigmoid sinuses, IJ bulbs. There is also evidence of preserved flow
signal in the cavernous sinuses.

Flow signal also appears preserved in the major draining cortical
veins.
IMPRESSION: 1. Normal noncontrast MRI appearance of the brain allowing for mild
motion artifact.

2. Normal intracranial MRV.  No dural venous sinus thrombosis.

3. Intracranial MRA is negative for large vessel occlusion. Some
large vessel detail is degraded by motion, but anterior and
posterior circulation branches appear within normal limits.

## 2020-04-29 IMAGING — MR MR HEAD W/O CM
12 of 15 series · 34 of 48 positions shown · non-contrast
Comparison: Plain head CT at [S5] hours today.

CLINICAL DATA: 29-year-old female is 12 weeks postpartum. Weakness
and altered mental status. Neurologic deficit.

EXAM:
MRI HEAD WITHOUT CONTRAST
MRA HEAD WITHOUT CONTRAST
MRV HEAD WITHOUT CONTRAST
TECHNIQUE: Multiplanar, multiecho pulse sequences of the brain and surrounding
structures were obtained without intravenous contrast. Angiographic
images of the Circle of Willis were obtained using MRA technique
without intravenous contrast. Angiographic images of the head using
MRV technique without contrast.

[Series 5: ax dwi_tracew · axial · 3.0mm · 0.60mm/px · z∈[-96,+57]mm · 5 of 96 slices shown (1 of 2)]
[im 1/96]
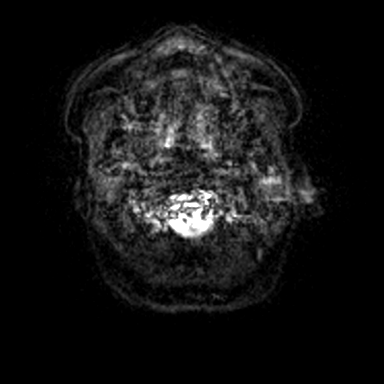
[im 24/96]
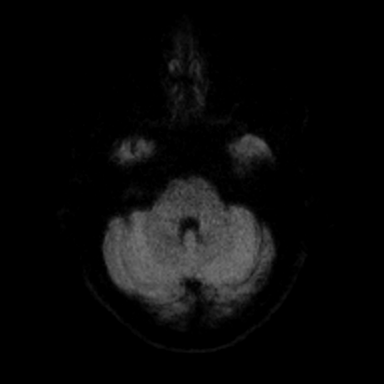
[im 48/96]
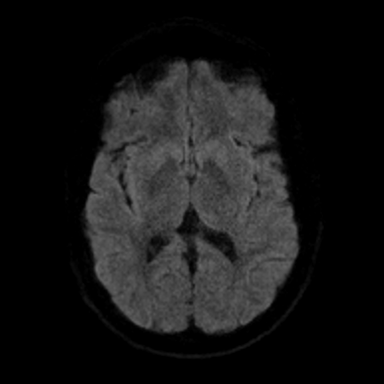
[im 72/96]
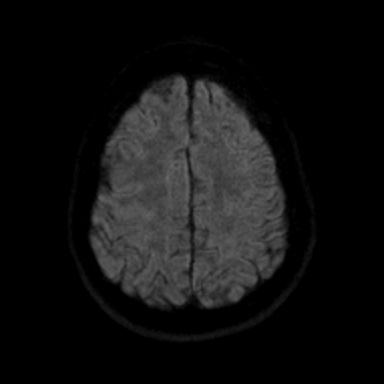
[im 96/96]
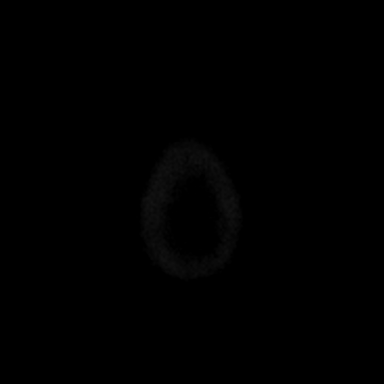

[Series 6: ax dwi_adc · axial · 3.0mm · 0.60mm/px · z∈[-96,+57]mm · 2 of 48 slices shown (1 of 2)]
[im 1/48]
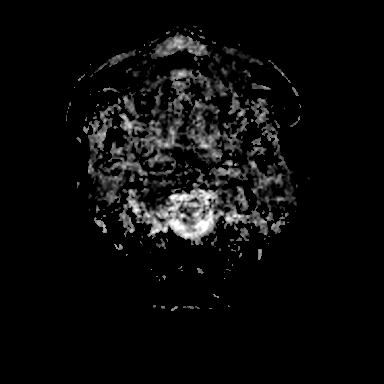
[im 48/48]
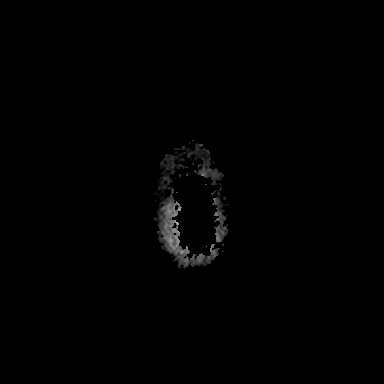

[Series 7: cor dwi_tracew · coronal · 5.0mm · 0.68mm/px · 5 of 80 slices shown (1 of 2)]
[im 1/80]
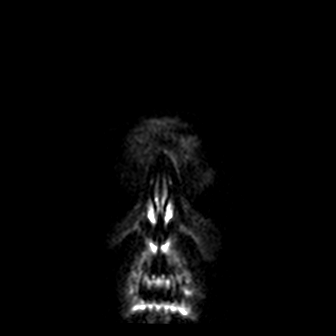
[im 20/80]
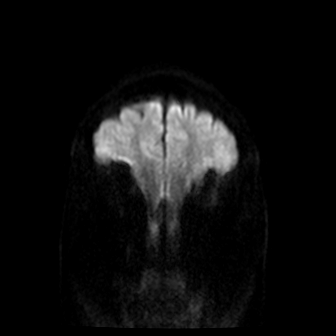
[im 40/80]
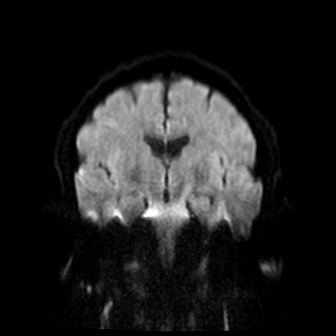
[im 60/80]
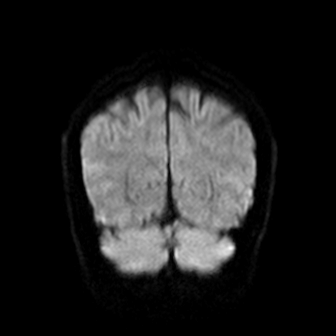
[im 80/80]
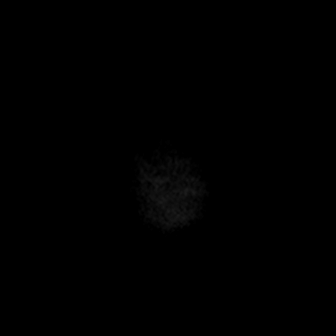

[Series 8: cor dwi_adc · coronal · 5.0mm · 0.68mm/px · 2 of 40 slices shown]
[im 1/40]
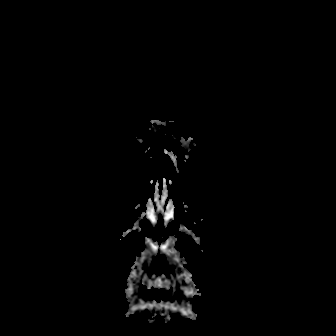
[im 40/40]
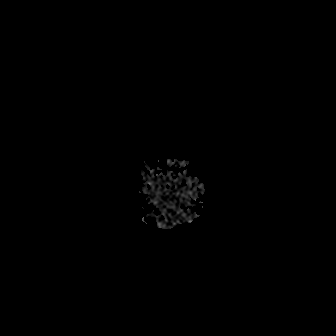

[Series 9: ax dwi_tracew · axial · 3.0mm · 0.60mm/px · z∈[-96,+57]mm · 6 of 96 slices shown (2 of 2)]
[im 1/96]
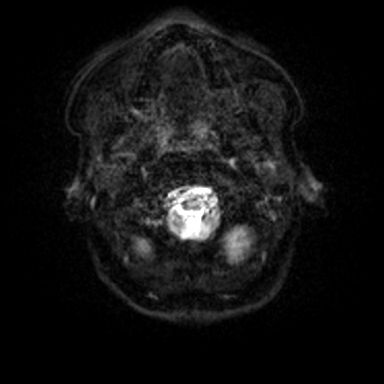
[im 20/96]
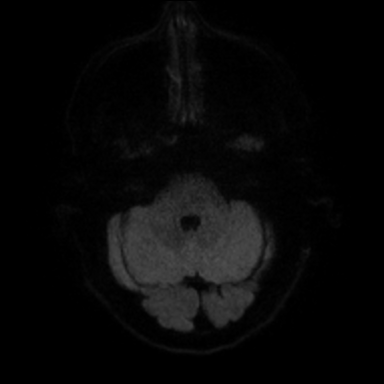
[im 39/96]
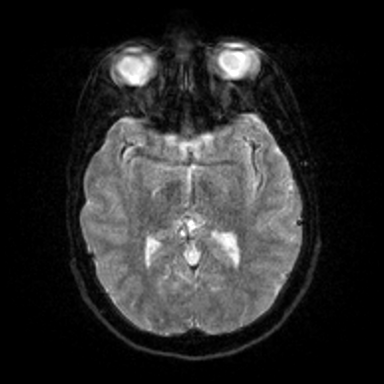
[im 58/96]
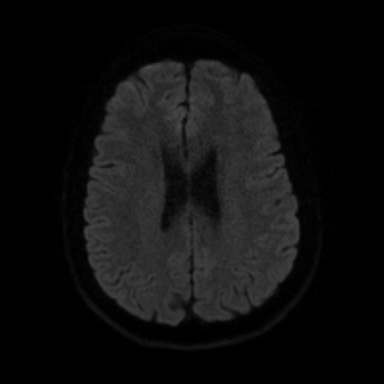
[im 77/96]
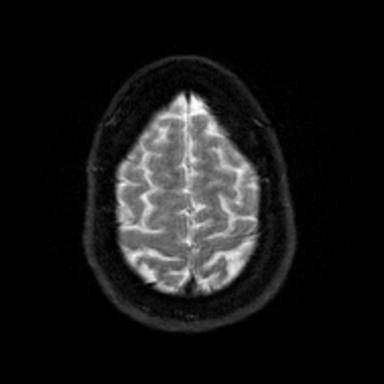
[im 96/96]
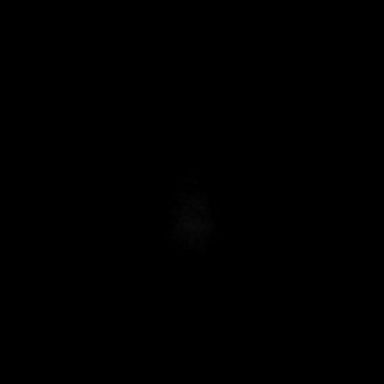

[Series 10: ax dwi_adc · axial · 3.0mm · 0.60mm/px · z∈[-96,+57]mm · 3 of 48 slices shown (2 of 2)]
[im 1/48]
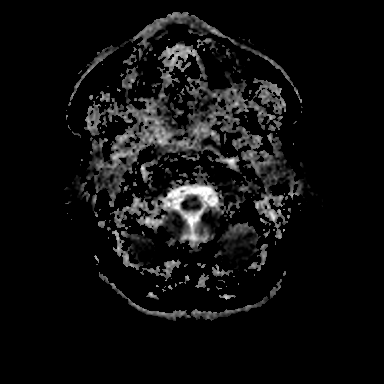
[im 24/48]
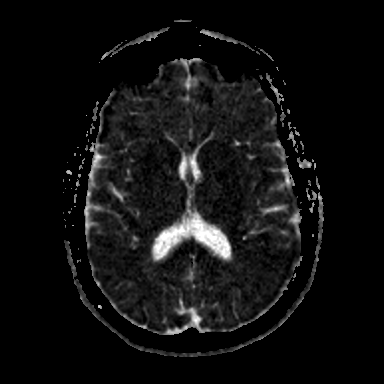
[im 48/48]
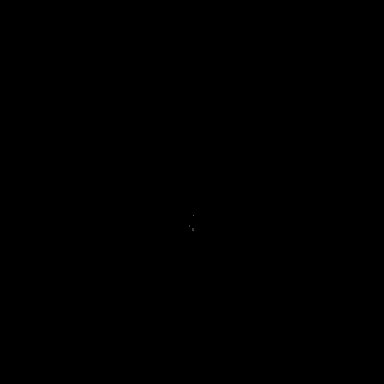

[Series 11: cor dwi_tracew · coronal · 5.0mm · 0.68mm/px · 1 of 80 slices shown (2 of 2)]
[im 1/80]
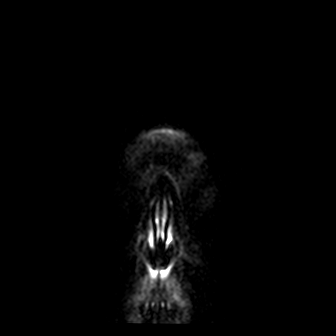

[Series 13: T1 · sagittal · 5.0mm · 0.62mm/px · 2 of 25 slices shown (1 of 2)]
[im 1/25]
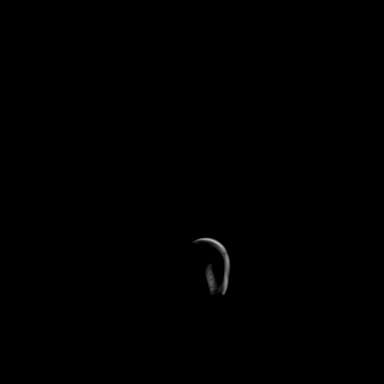
[im 25/25]
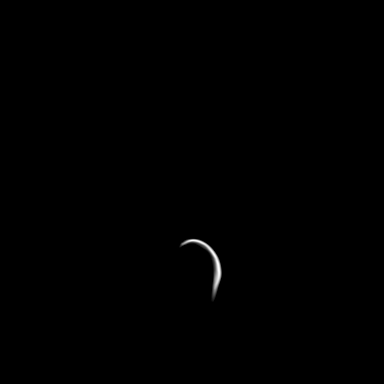

[Series 14: T2 · axial · 5.0mm · 0.45mm/px · z∈[-76,+79]mm · 2 of 27 slices shown (1 of 2)]
[im 1/27]
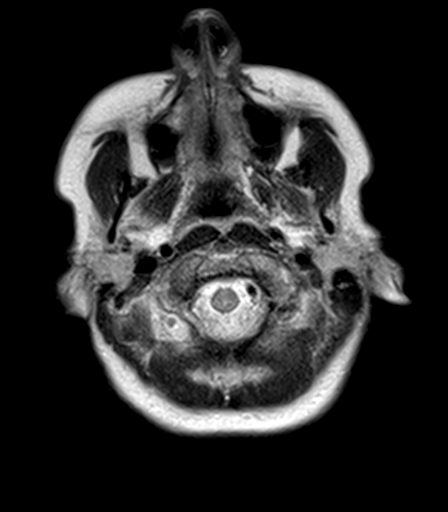
[im 27/27]
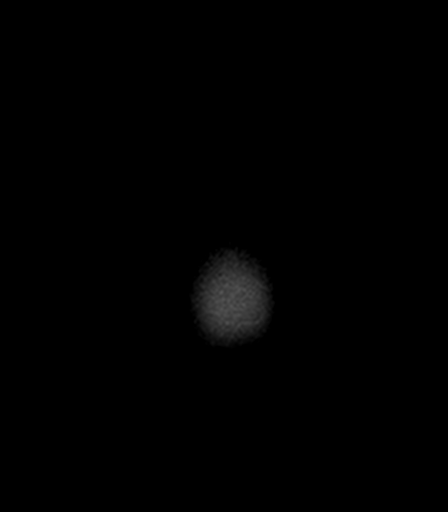

[Series 19: T1 · axial · 5.0mm · 0.90mm/px · z∈[-78,+78]mm · 2 of 27 slices shown (2 of 2)]
[im 1/27]
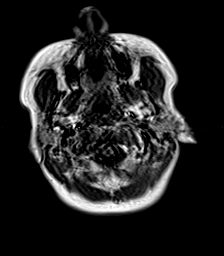
[im 27/27]
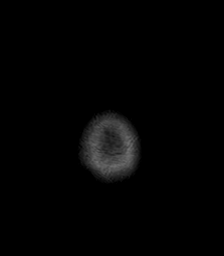

[Series 20: FLAIR · axial · 5.0mm · 1.20mm/px · z∈[-78,+78]mm · 2 of 27 slices shown]
[im 1/27]
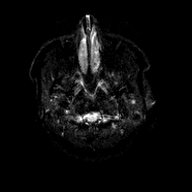
[im 27/27]
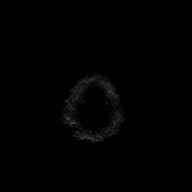

[Series 21: T2 · coronal · 5.0mm · 0.45mm/px · 2 of 31 slices shown (2 of 2)]
[im 1/31]
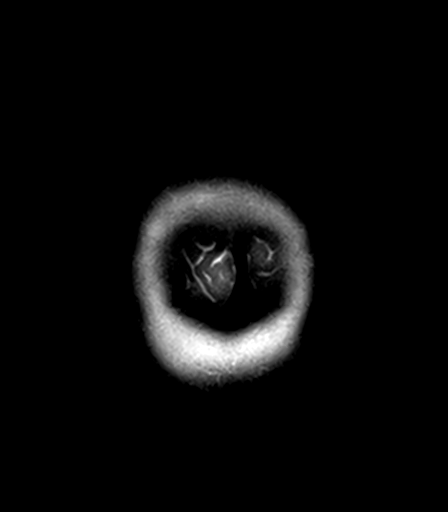
[im 31/31]
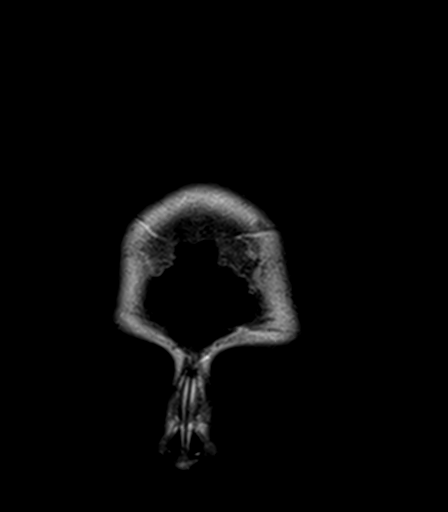

[34 of 48 positions shown; findings below may reference images not displayed]

FINDINGS: MRI HEAD FINDINGS

Brain: Despite repeated imaging techniques some sequences are mildly
motion degraded (axial FLAIR).

Cerebral volume is within normal limits. No restricted diffusion to
suggest acute infarction. No midline shift, mass effect, evidence of
mass lesion, ventriculomegaly, extra-axial collection or acute
intracranial hemorrhage. Cervicomedullary junction and pituitary are
within normal limits.

Gray and white matter signal is within normal limits throughout the
brain. No encephalomalacia or cerebral edema identified. No evidence
of cerebral hemorrhage on susceptibility weighted imaging.

Vascular: Major intracranial vascular flow voids are preserved. The
left vertebral artery appears dominant. See MRA and MRV findings
below.

Skull and upper cervical spine: Visualized bone marrow signal is
within normal limits. Negative visible upper cervical spine.

Sinuses/Orbits: Negative.

Other: Mastoids are clear. Grossly normal visible internal auditory
structures. Scalp and face soft tissues appear negative.

MRA HEAD FINDINGS

Intermittently degraded by motion artifact despite repeated imaging
attempts.

Antegrade flow in the posterior circulation with dominant appearing
distal left vertebral artery. Distal vertebral arteries and basilar
remain patent. Patent right PICA origin is evident. Basilar detail
is limited. But the basilar tip, SCA and PCA origins are patent and
appear within normal limits. Posterior communicating arteries are
present. Bilateral PCA branches are grossly normal.

Antegrade flow in both ICA siphons. ICA detail is limited but both
siphons remain patent to the termini. MCA and ACA origins are patent
and within normal limits. Grossly normal anterior communicating
artery, visible ACA branches. Bilateral MCA M1 segments and MCA
bifurcations are patent. Grossly normal visible bilateral MCA
branches.

MRV HEAD FINDINGS

Coronal 2D and sagittal 3D time-of-flight MRV imaging. Fairly good
image quality on the coronal images.

Preserved flow signal in the superior sagittal sinus, torcula,
straight sinus, vein of [place], internal cerebral veins. Preserved
flow signal in codominant appearing bilateral transverse sinuses,
sigmoid sinuses, IJ bulbs. There is also evidence of preserved flow
signal in the cavernous sinuses.

Flow signal also appears preserved in the major draining cortical
veins.
IMPRESSION: 1. Normal noncontrast MRI appearance of the brain allowing for mild
motion artifact.

2. Normal intracranial MRV.  No dural venous sinus thrombosis.

3. Intracranial MRA is negative for large vessel occlusion. Some
large vessel detail is degraded by motion, but anterior and
posterior circulation branches appear within normal limits.

## 2020-04-29 IMAGING — MR MR MRA HEAD W/O CM
1 series · 18 of 48 positions shown · non-contrast
Comparison: Plain head CT at [S5] hours today.

CLINICAL DATA: 29-year-old female is 12 weeks postpartum. Weakness
and altered mental status. Neurologic deficit.

EXAM:
MRI HEAD WITHOUT CONTRAST
MRA HEAD WITHOUT CONTRAST
MRV HEAD WITHOUT CONTRAST
TECHNIQUE: Multiplanar, multiecho pulse sequences of the brain and surrounding
structures were obtained without intravenous contrast. Angiographic
images of the Circle of Willis were obtained using MRA technique
without intravenous contrast. Angiographic images of the head using
MRV technique without contrast.

[Series 1: TOF · axial · 0.5mm · 0.41mm/px · z∈[-87,+11]mm · 18 of 205 slices shown]
[im 1/205]
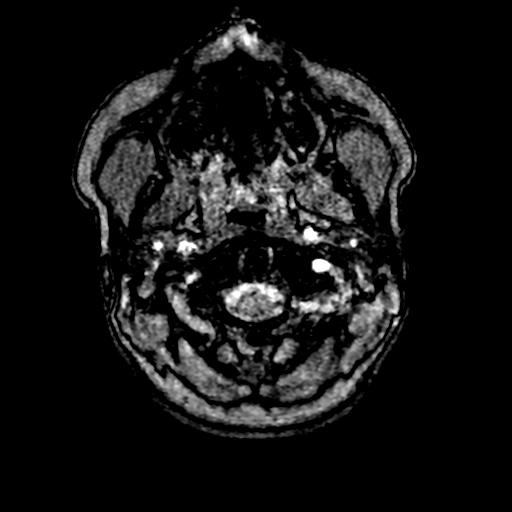
[im 5/205]
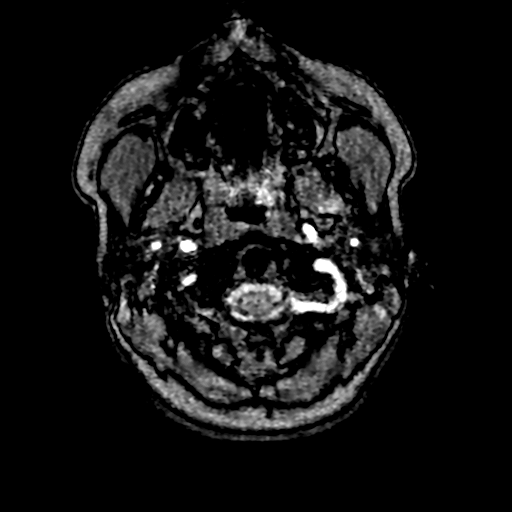
[im 9/205]
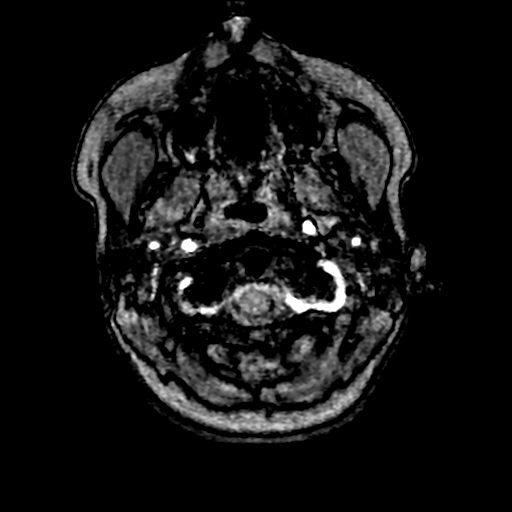
[im 14/205]
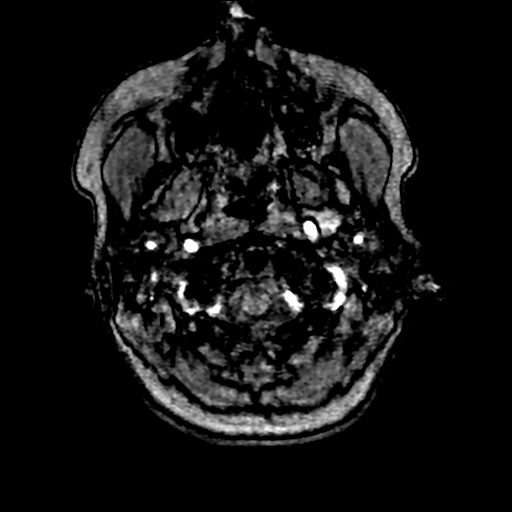
[im 18/205]
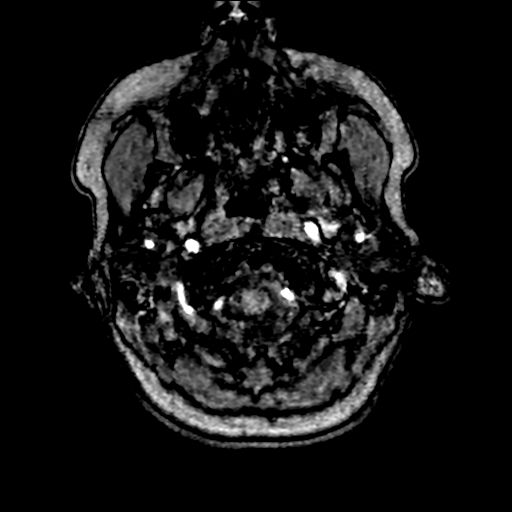
[im 22/205]
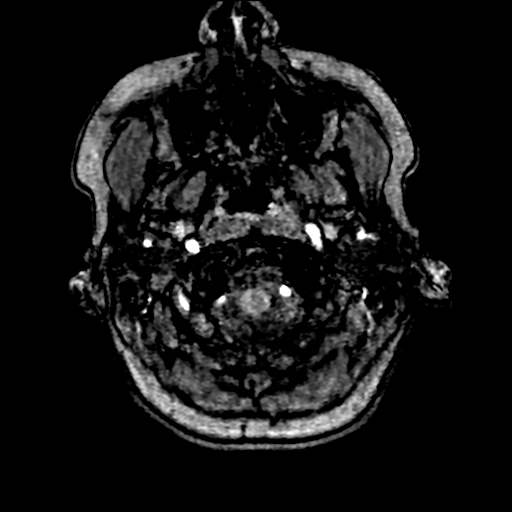
[im 27/205]
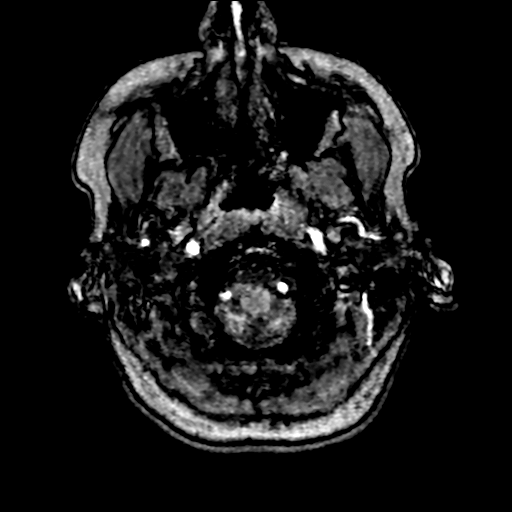
[im 31/205]
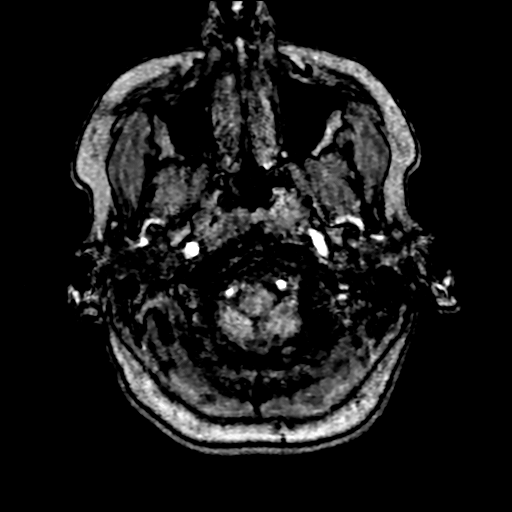
[im 35/205]
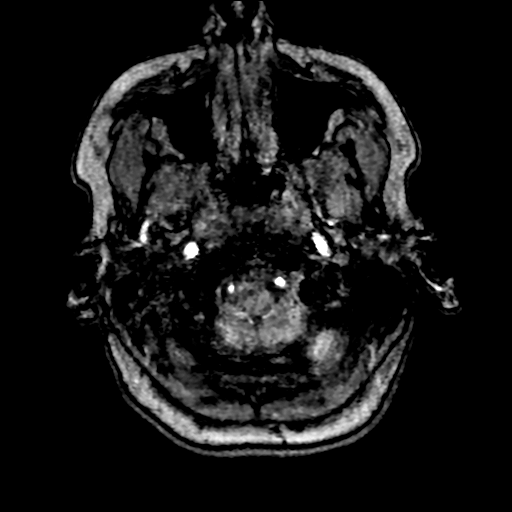
[im 40/205]
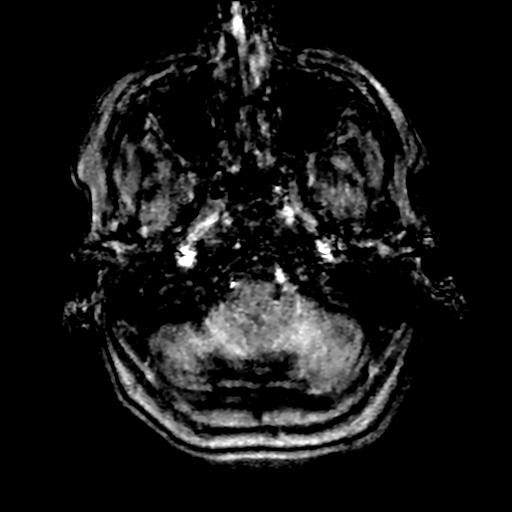
[im 66/205]
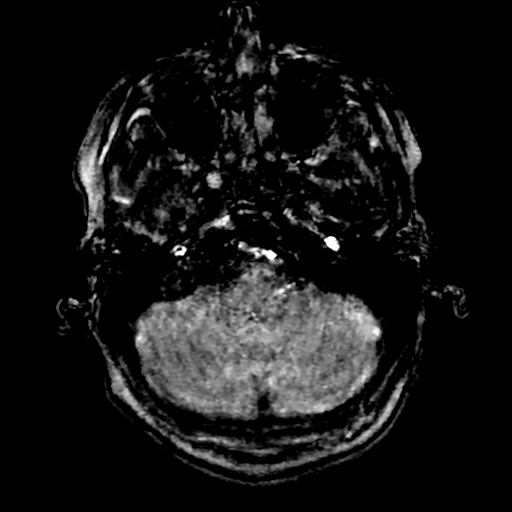
[im 92/205]
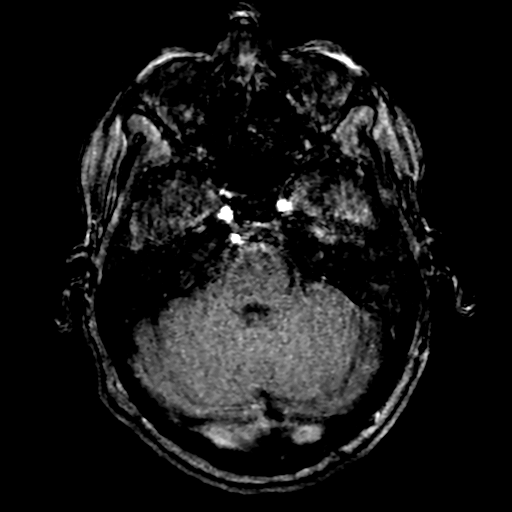
[im 105/205]
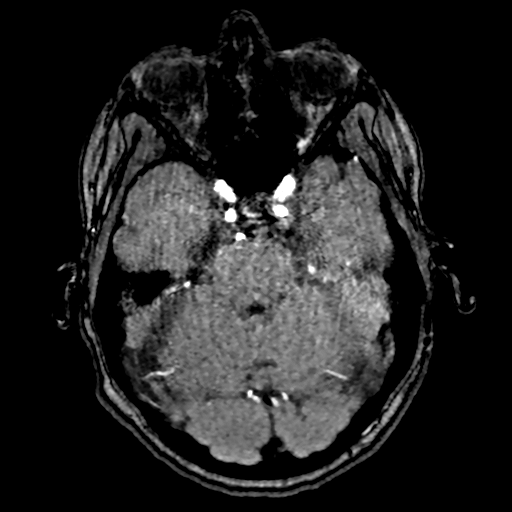
[im 118/205]
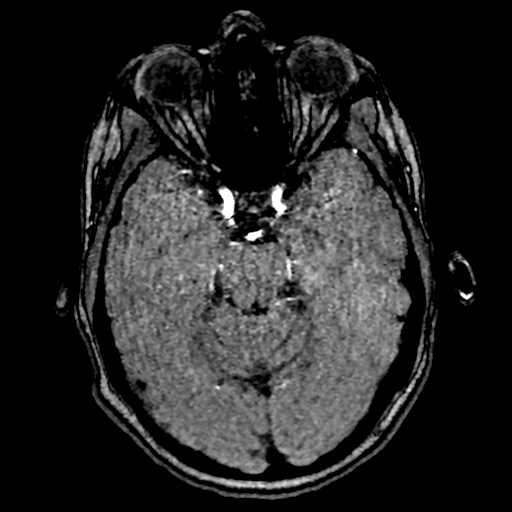
[im 144/205]
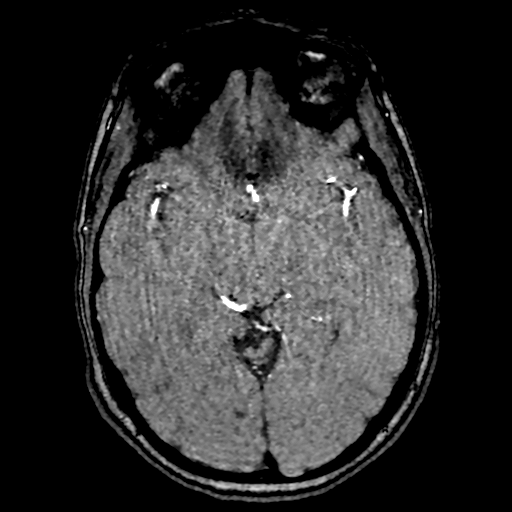
[im 170/205]
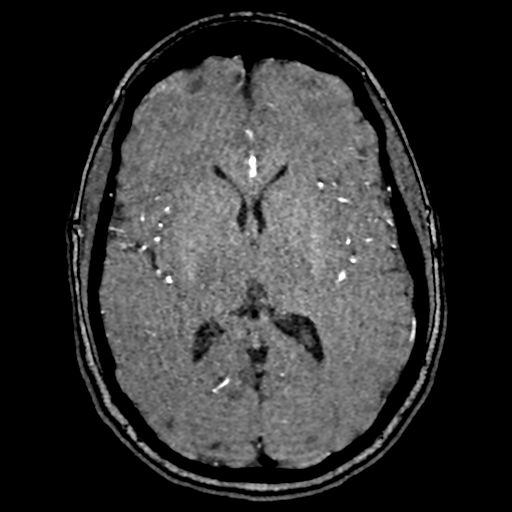
[im 174/205]
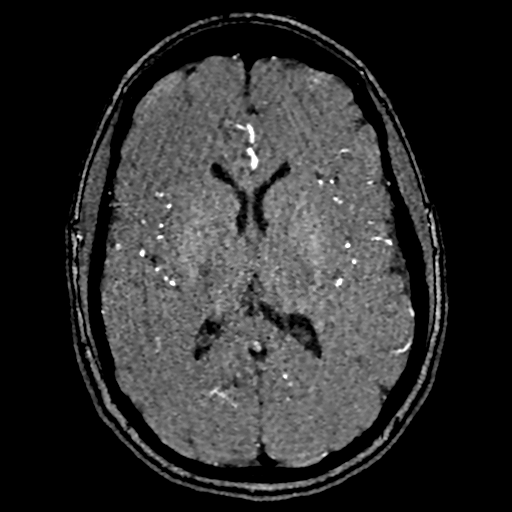
[im 196/205]
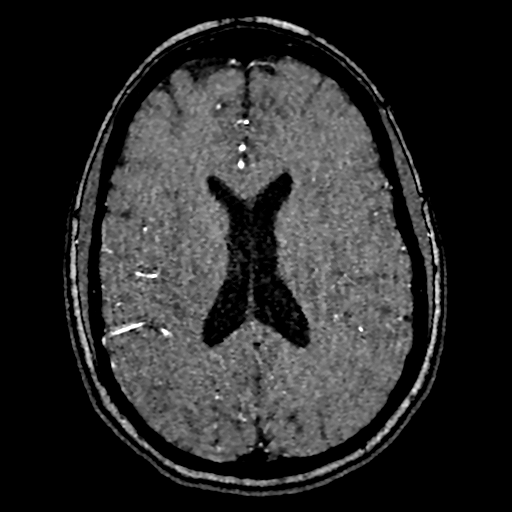

[18 of 48 positions shown; findings below may reference images not displayed]

FINDINGS: MRI HEAD FINDINGS

Brain: Despite repeated imaging techniques some sequences are mildly
motion degraded (axial FLAIR).

Cerebral volume is within normal limits. No restricted diffusion to
suggest acute infarction. No midline shift, mass effect, evidence of
mass lesion, ventriculomegaly, extra-axial collection or acute
intracranial hemorrhage. Cervicomedullary junction and pituitary are
within normal limits.

Gray and white matter signal is within normal limits throughout the
brain. No encephalomalacia or cerebral edema identified. No evidence
of cerebral hemorrhage on susceptibility weighted imaging.

Vascular: Major intracranial vascular flow voids are preserved. The
left vertebral artery appears dominant. See MRA and MRV findings
below.

Skull and upper cervical spine: Visualized bone marrow signal is
within normal limits. Negative visible upper cervical spine.

Sinuses/Orbits: Negative.

Other: Mastoids are clear. Grossly normal visible internal auditory
structures. Scalp and face soft tissues appear negative.

MRA HEAD FINDINGS

Intermittently degraded by motion artifact despite repeated imaging
attempts.

Antegrade flow in the posterior circulation with dominant appearing
distal left vertebral artery. Distal vertebral arteries and basilar
remain patent. Patent right PICA origin is evident. Basilar detail
is limited. But the basilar tip, SCA and PCA origins are patent and
appear within normal limits. Posterior communicating arteries are
present. Bilateral PCA branches are grossly normal.

Antegrade flow in both ICA siphons. ICA detail is limited but both
siphons remain patent to the termini. MCA and ACA origins are patent
and within normal limits. Grossly normal anterior communicating
artery, visible ACA branches. Bilateral MCA M1 segments and MCA
bifurcations are patent. Grossly normal visible bilateral MCA
branches.

MRV HEAD FINDINGS

Coronal 2D and sagittal 3D time-of-flight MRV imaging. Fairly good
image quality on the coronal images.

Preserved flow signal in the superior sagittal sinus, torcula,
straight sinus, vein of [place], internal cerebral veins. Preserved
flow signal in codominant appearing bilateral transverse sinuses,
sigmoid sinuses, IJ bulbs. There is also evidence of preserved flow
signal in the cavernous sinuses.

Flow signal also appears preserved in the major draining cortical
veins.
IMPRESSION: 1. Normal noncontrast MRI appearance of the brain allowing for mild
motion artifact.

2. Normal intracranial MRV.  No dural venous sinus thrombosis.

3. Intracranial MRA is negative for large vessel occlusion. Some
large vessel detail is degraded by motion, but anterior and
posterior circulation branches appear within normal limits.

## 2020-04-29 MED ORDER — GADOBUTROL 1 MMOL/ML IV SOLN: 6.0000 mL | Freq: Once | INTRAVENOUS | Status: DC | PRN

## 2020-04-29 MED ORDER — LORAZEPAM 2 MG/ML IJ SOLN
0.5000 mg | Freq: Once | INTRAMUSCULAR | Status: AC
  Administered 2020-04-29: 11:00:00 0.5 mg via INTRAVENOUS
  Filled 2020-04-29: qty 1

## 2020-04-29 MED ORDER — LORAZEPAM 2 MG/ML IJ SOLN
1.0000 mg | Freq: Once | INTRAMUSCULAR | Status: AC
  Administered 2020-04-29: 13:00:00 1 mg via INTRAVENOUS
  Filled 2020-04-29: qty 1

## 2020-04-29 NOTE — ED Triage Notes (Signed)
Pt here with weakness from home. Pt is 12 weeks pp and began to have weak symptoms that started last night and got worse this morning. Pt has hx of anxiety.

## 2020-04-29 NOTE — ED Provider Notes (Signed)
[facility] Emergency Department Provider Note  Time seen: 10:30 AM  I have reviewed the triage vital signs and the nursing notes.   HISTORY  Chief Complaint Left-sided weakness, chest pain  HPI Tiffany Whitehead is a 29 y.o. female with a past medical history of anxiety, depression, presents to the emergency department for left-sided weakness and chest pain.  According to the patient they were out to dinner last night when she began feeling unwell, states she was feeling confused and dizzy was feeling like her left face and arm were weak or numb so they went home.  Patient denies any alcohol use.  Patient states she awoke this morning still feeling confused with left-sided weakness/numbness, developed chest pain this morning so they called EMS who brought the patient to the emergency department.  Here the patient is awake alert oriented is able to give a good story but states she feels confused.  Patient states a history of anxiety, did not take Ativan last night because she was unsure if this was a panic attack.  Patient is 12 weeks postpartum.   Past Medical History:  Diagnosis Date  . Anxiety   . Chronic kidney disease    stones and infection  . Depression   . H/O borderline personality disorder    stopped meds at beginning of pregnancy, Lamictal and Vyvanse  . Hyperemesis affecting pregnancy, antepartum 09/23/2019  . UTI (urinary tract infection)     Patient Active Problem List   Diagnosis Date Noted  . Uterine contractions during pregnancy 01/26/2020  . Non-reactive NST (non-stress test) 01/26/2020  . Pelvic pain in pregnancy 01/06/2020  . Encounter for suspected PROM, with rupture of membranes not found 12/10/2019  . UTI (urinary tract infection) during pregnancy 11/21/2019  . Encounter for supervision of other normal pregnancy, third trimester 07/10/2019  . Anxiety 11/22/2017  . GAD (generalized anxiety disorder) 07/04/2015  . ADHD (attention deficit  hyperactivity disorder), combined type 07/04/2015  . Family history of colon cancer 07/04/2015  . Lactose intolerance 07/04/2015  . Transient gluten sensitivity 07/04/2015    Past Surgical History:  Procedure Laterality Date  . APPENDECTOMY    . COLONOSCOPY    . extraction of wisdom teeth      Prior to Admission medications   Medication Sig Start Date End Date Taking? Authorizing Provider  clonazePAM (KLONOPIN) 0.5 MG tablet Take by mouth 2 (two) times daily as needed for anxiety.    [provider]  ferrous sulfate 325 (65 FE) MG tablet Take 1 tablet (325 mg total) by mouth daily. 01/28/20   [name], CNM  hydrOXYzine (ATARAX/VISTARIL) 25 MG tablet Take 25 mg by mouth daily. 12/20/19   [provider]  ibuprofen (ADVIL) 600 MG tablet Take 1 tablet (600 mg total) by mouth every 6 (six) hours as needed for mild pain, moderate pain or cramping. 01/28/20   [name], CNM  loratadine (CLARITIN) 10 MG tablet Take 10 mg by mouth daily.    [provider]  Prenatal Vit-Fe Fumarate-FA (MULTIVITAMIN-PRENATAL) 27-0.8 MG TABS tablet Take 1 tablet by mouth daily.    [provider]  sertraline (ZOLOFT) 100 MG tablet Take 200 mg by mouth at bedtime.  09/04/19   [provider]    Allergies  Allergen Reactions  . Ceclor [Cefaclor] Rash    Family History  Problem Relation Age of Onset  . Cancer Mother   . Cancer Other     Social History Social History  Tobacco Use  . Smoking status: Never Smoker  . Smokeless tobacco: Never Used  Vaping Use  . Vaping Use: Never used  Substance Use Topics  . Alcohol use: Not Currently    Comment: social  . Drug use: No    Review of Systems Constitutional: Negative for fever. Cardiovascular: Central left-sided chest pain, mild Respiratory: Negative for shortness of breath. Gastrointestinal: Negative for abdominal pain, vomiting  Musculoskeletal: Negative for musculoskeletal  complaints Neurological: Negative for headache.  Left-sided weakness/numbness. All other ROS negative  ____________________________________________   PHYSICAL EXAM:  VITAL SIGNS: ED Triage Vitals  Enc Vitals Group     BP --      Pulse Rate 04/29/20 1027 74     Resp 04/29/20 1027 (!) 185     Temp 04/29/20 1027 99.3 F (37.4 C)     Temp Source 04/29/20 1027 Oral     SpO2 04/29/20 1027 100 %     Weight 04/29/20 1029 150 lb (68 kg)     Height 04/29/20 1029 5\' 8"  (1.727 m)     Head Circumference --      Peak Flow --      Pain Score 04/29/20 1029 5     Pain Loc --      Pain Edu? --      Excl. in GC? --    Constitutional: Alert and oriented. Well appearing and in no distress. Eyes: Normal exam ENT      Head: Normocephalic and atraumatic.      Mouth/Throat: Mucous membranes are moist. Cardiovascular: Normal rate, regular rhythm. No murmur Respiratory: Normal respiratory effort without tachypnea nor retractions. Breath sounds are clear  Gastrointestinal: Soft and nontender. No distention.   Musculoskeletal: Nontender able to move all extremities. Neurologic:  Normal speech and language.  No obvious facial droop.  Patient does state diminished sensation to the left face and arm.  Patient has diminished strength to the left side as well.  4/5 strength left upper extremity 4/5 in left lower extremity.  Minimal grip strength in left upper extremity.  Mild pronator drift in left upper extremity. Skin:  Skin is warm, dry and intact.  Psychiatric: Rapid speech, appears anxious.  ____________________________________________    EKG  EKG viewed and interpreted by myself shows a normal sinus rhythm at 78 bpm with a narrow QRS, normal axis, normal intervals, nonspecific ST changes.  ____________________________________________    RADIOLOGY  CT head is negative  ____________________________________________   INITIAL IMPRESSION / ASSESSMENT AND PLAN / ED COURSE  Pertinent labs &  imaging results that were available during my care of the patient were reviewed by me and considered in my medical decision making (see chart for details).   Patient presents to the emergency department for chest pain as well as left-sided deficits.  On exam patient has left arm and leg deficits.  Patient is speaking without difficulty but his rapid speech appears anxious.  Has a history of anxiety.  We will dose Ativan.  We will check labs including cardiac enzymes.  We will obtain a CT scan of the head.  Patient agreeable to work-up.  Patient is feeling much better after anxiety medication.  States no longer feels any chest pain.  No longer feels like she is going to die.  Patient continues to have significant left upper extremity deficit however on exam.  I suspect this is likely anxiety related however given her recent postpartum status we will obtain an MRI/A/V to further evaluate and rule out  stroke or central venous thrombosis.  Patient agreeable to plan of care.  Patient's MRI/MRA/MRV is negative.  Patient is feeling much better after the second dose of Ativan.  Patient thinks this could just be stress or anxiety induced.  Very likely could be anxiety induced.  I discussed with the patient given her reassuring work-up we will discharge her home she is to take her home anxiety medications.  If the patient worsens or fails to improve in the next 24 hours she is to return to the emergency department otherwise she is to follow-up with her doctor.  Patient appears very well.  We will discharge home.  KIRBIE STODGHILL was evaluated in Emergency Department on 04/29/2020 for the symptoms described in the history of present illness. She was evaluated in the context of the global COVID-19 pandemic, which necessitated consideration that the patient might be at risk for infection with the SARS-CoV-2 virus that causes COVID-19. Institutional protocols and algorithms that pertain to the evaluation of patients at  risk for COVID-19 are in a state of rapid change based on information released by regulatory bodies including the CDC and federal and state organizations. These policies and algorithms were followed during the patient's care in the ED.  ____________________________________________   FINAL CLINICAL IMPRESSION(S) / ED DIAGNOSES  Left-sided weakness Anxiety Chest pain Paresthesia   Minna Antis, MD 04/29/20 1513

## 2020-04-29 NOTE — Discharge Instructions (Addendum)
As we discussed please take your home anxiety medications.  If your symptoms return or worsen over the next 24 hours please return to the emergency department.  Otherwise please follow-up with your doctor for recheck/reevaluation.

## 2022-01-13 ENCOUNTER — Ambulatory Visit
Admission: EM | Admit: 2022-01-13 | Discharge: 2022-01-13 | Disposition: A | Discharge status: Home / Self Care | Payer: BC Managed Care – PPO | Attending: Family Medicine | Admitting: Family Medicine

## 2022-01-13 ENCOUNTER — Other Ambulatory Visit: Payer: Self-pay

## 2022-01-13 ENCOUNTER — Encounter: Payer: Self-pay | Admitting: Emergency Medicine

## 2022-01-13 DIAGNOSIS — J020 Streptococcal pharyngitis: Secondary | ICD-10-CM

## 2022-01-13 LAB — POCT RAPID STREP A (OFFICE): Rapid Strep A Screen: POSITIVE — AB

## 2022-01-13 MED ORDER — FLUCONAZOLE 150 MG PO TABS
150.0000 mg | ORAL_TABLET | ORAL | 0 refills | Status: AC | PRN
Start: 2022-01-13 — End: ?

## 2022-01-13 MED ORDER — AMOXICILLIN-POT CLAVULANATE 875-125 MG PO TABS: 1.0000 | ORAL_TABLET | Freq: Two times a day (BID) | ORAL | 0 refills | Status: DC

## 2022-01-13 NOTE — ED Provider Notes (Signed)
Renaldo Fiddler    CSN: [id number] Arrival date & time: 01/13/22  1141      History   Chief Complaint Chief Complaint  Patient presents with   Sore Throat    HPI Tiffany Whitehead is a 31 y.o. female.   HPI Patient presents today for evaluation of sore throat x 2 days. Patient further explains since giving birth in May 2023,  she has had positive strep 4 times. 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 Mostly recently per EMR patient tested positive for strep 3 weeks ago during the urgent care visit at [facility] clinic.  She was treated  with Amoxil and completed entire course of medication. She denies fever.  She denies any other associated URI symptoms. She endorses pain with swallowing, throat fullness, and fatigue. She is also currently breastfeeding. Past Medical History:  Diagnosis Date   Anxiety    Chronic kidney disease    stones and infection   Depression    H/O borderline personality disorder    stopped meds at beginning of pregnancy, Lamictal and Vyvanse   Hyperemesis affecting pregnancy, antepartum 09/23/2019   UTI (urinary tract infection)     Patient Active Problem List   Diagnosis Date Noted   Uterine contractions during pregnancy 01/26/2020   Non-reactive NST (non-stress test) 01/26/2020   Pelvic pain in pregnancy 01/06/2020   Encounter for suspected PROM, with rupture of membranes not found 12/10/2019   UTI (urinary tract infection) during pregnancy 11/21/2019   Encounter for supervision of other normal pregnancy, third trimester 07/10/2019   Anxiety 11/22/2017   GAD (generalized anxiety disorder) 07/04/2015   ADHD (attention deficit hyperactivity disorder), combined type 07/04/2015   Family history of colon cancer 07/04/2015   Lactose intolerance 07/04/2015   Transient gluten sensitivity 07/04/2015    Past Surgical History:  Procedure Laterality Date   APPENDECTOMY     COLONOSCOPY      extraction of wisdom teeth      OB History     Gravida  3   Para  2   Term  2   Preterm      AB  1   Living  2      SAB  1   IAB      Ectopic      Multiple  0   Live Births  2            Home Medications    Prior to Admission medications   Medication Sig Start Date End Date Taking? Authorizing Provider  amoxicillin-clavulanate (AUGMENTIN) 875-125 MG tablet Take 1 tablet by mouth 2 (two) times daily. 01/13/22  Yes Bing Neighbors, FNP  buPROPion (WELLBUTRIN XL) 300 MG 24 hr tablet Take 300 mg by mouth daily. 11/04/21  Yes [provider]  fluconazole (DIFLUCAN) 150 MG tablet Take 1 tablet (150 mg total) by mouth every three (3) days as needed. Repeat if needed 01/13/22  Yes Bing Neighbors, FNP  FLUoxetine (PROZAC) 20 MG capsule Take 20 mg by mouth daily. 08/07/21  Yes [provider]  Prenatal Vit-Fe Fumarate-FA (MULTIVITAMIN-PRENATAL) 27-0.8 MG TABS tablet Take 1 tablet by mouth daily.   Yes [provider]  clonazePAM (KLONOPIN) 0.5 MG tablet Take by mouth 2 (two) times daily as needed for anxiety.    [provider]  ferrous sulfate 325 (65 FE) MG tablet Take 1 tablet (325 mg total) by mouth daily. 01/28/20  [name], CNM  hydrOXYzine (ATARAX/VISTARIL) 25 MG tablet Take 25 mg by mouth daily. 12/20/19   [provider]  ibuprofen (ADVIL) 600 MG tablet Take 1 tablet (600 mg total) by mouth every 6 (six) hours as needed for mild pain, moderate pain or cramping. 01/28/20   [name], CNM  loratadine (CLARITIN) 10 MG tablet Take 10 mg by mouth daily.    [provider]  sertraline (ZOLOFT) 100 MG tablet Take 200 mg by mouth at bedtime.  09/04/19   [provider]    Family History Family History  Problem Relation Age of Onset   Cancer Mother    Cancer Other     Social History Social History   Tobacco Use   Smoking status: Never   Smokeless tobacco: Never  Vaping Use   Vaping  Use: Never used  Substance Use Topics   Alcohol use: Not Currently    Comment: social   Drug use: No     Allergies   Ceclor [cefaclor] and Bee venom   Review of Systems Review of Systems Pertinent negatives listed in HPI   Physical Exam Triage Vital Signs ED Triage Vitals  Enc Vitals Group     BP 01/13/22 1230 106/76     Pulse Rate 01/13/22 1230 (!) 101     Resp 01/13/22 1230 12     Temp 01/13/22 1230 99.5 F (37.5 C)     Temp Source 01/13/22 1230 Oral     SpO2 01/13/22 1230 97 %     Weight --      Height --      Head Circumference --      Peak Flow --      Pain Score 01/13/22 1234 7     Pain Loc --      Pain Edu? --      Excl. in GC? --    No data found.  Updated Vital Signs BP 106/76 (BP Location: Right Arm)   Pulse (!) 101   Temp 99.5 F (37.5 C) (Oral)   Resp 12   LMP  (LMP Unknown)   SpO2 97%   Breastfeeding Yes   Visual Acuity Right Eye Distance:   Left Eye Distance:   Bilateral Distance:    Right Eye Near:   Left Eye Near:    Bilateral Near:     Physical Exam Vitals and nursing note reviewed.  Constitutional:      Appearance: She is well-developed.  HENT:     Head: Normocephalic and atraumatic.     Nose: No congestion or rhinorrhea.     Mouth/Throat:     Mouth: Mucous membranes are moist. Mucous membranes are pale.  Eyes:     Conjunctiva/sclera: Conjunctivae normal.     Pupils: Pupils are equal, round, and reactive to light.  Cardiovascular:     Rate and Rhythm: Normal rate and regular rhythm.  Pulmonary:     Effort: Pulmonary effort is normal.     Breath sounds: Normal breath sounds.  Musculoskeletal:     Cervical back: Normal range of motion and neck supple.  Lymphadenopathy:     Cervical: Cervical adenopathy present.  Skin:    General: Skin is warm and dry.     Capillary Refill: Capillary refill takes less than 2 seconds.  Neurological:     General: No focal deficit present.     Mental Status: She is alert and oriented to  person, place, and time.      UC  Treatments / Results  Labs (all labs ordered are listed, but only abnormal results are displayed) Labs Reviewed  POCT RAPID STREP A (OFFICE) - Abnormal; Notable for the following components:      Result Value   Rapid Strep A Screen Positive (*)    All other components within normal limits    EKG   Radiology No results found.  Procedures Procedures (including critical care time)  Medications Ordered in UC Medications - No data to display  Initial Impression / Assessment and Plan / UC Course  I have reviewed the triage vital signs and the nursing notes.  Pertinent labs & imaging results that were available during my care of the patient were reviewed by me and considered in my medical decision making (see chart for details).    Streptococcal Throat infection, Rapid Strep positive. Treating with Augmentin, given that patient has been treated twice with Amoxicillin and twice with Penicillin. Information provided to follow-up with [facility] for consultation for possible tonsil removal. Tylenol as needed for pain. Strict return precautions if symptoms do not resolve with prescribed treatment. Final Clinical Impressions(s) / UC Diagnoses   Final diagnoses:  Streptococcal sore throat, Recurrent      Discharge Instructions      You have tested positive for Strep again. I am increasing your therapy to Augmentin twice daily for 10 days. I have included follow-up information for you to contact  ear nose and throat for consultation regarding recurrent strep.     ED Prescriptions     Medication Sig Dispense Auth. Provider   amoxicillin-clavulanate (AUGMENTIN) 875-125 MG tablet Take 1 tablet by mouth 2 (two) times daily. 20 tablet Bing Neighbors, FNP   fluconazole (DIFLUCAN) 150 MG tablet Take 1 tablet (150 mg total) by mouth every three (3) days as needed. Repeat if needed 1 tablet Bing Neighbors, FNP      PDMP not  reviewed this encounter.   Bing Neighbors, FNP 01/16/22 [phone]

## 2022-01-13 NOTE — Discharge Instructions (Signed)
You have tested positive for Strep again. I am increasing your therapy to Augmentin twice daily for 10 days. I have included follow-up information for you to contact [facility] for consultation regarding recurrent strep.

## 2022-01-13 NOTE — ED Triage Notes (Signed)
Patient c/o sore throat  2 days.   Patient denies fever.   Patient endorses painful swallowing. Patient endorses fatigue   Patient endorses 4 strep throat infections in the past 12 weeks per patient statement. Patient completed the antibiotic for each strep diagnosis.   Patient is currently breastfeeding.

## 2022-04-07 ENCOUNTER — Other Ambulatory Visit: Payer: Self-pay | Admitting: Family Medicine

## 2023-04-02 ENCOUNTER — Other Ambulatory Visit: Payer: Self-pay | Admitting: Infectious Diseases

## 2023-04-02 DIAGNOSIS — M546 Pain in thoracic spine: Secondary | ICD-10-CM

## 2023-04-02 DIAGNOSIS — G629 Polyneuropathy, unspecified: Secondary | ICD-10-CM

## 2023-04-07 ENCOUNTER — Ambulatory Visit
Admission: RE | Admit: 2023-04-07 | Discharge: 2023-04-07 | Disposition: A | Discharge status: Home / Self Care | Payer: BC Managed Care – PPO | Source: Ambulatory Visit | Attending: Infectious Diseases | Admitting: Infectious Diseases

## 2023-04-07 DIAGNOSIS — M546 Pain in thoracic spine: Secondary | ICD-10-CM | POA: Diagnosis present

## 2023-04-07 DIAGNOSIS — G629 Polyneuropathy, unspecified: Secondary | ICD-10-CM | POA: Insufficient documentation

## 2023-04-07 MED ORDER — GADOBUTROL 1 MMOL/ML IV SOLN
7.0000 mL | Freq: Once | INTRAVENOUS | Status: AC | PRN
  Administered 2023-04-07: 7 mL via INTRAVENOUS

## 2023-04-12 ENCOUNTER — Other Ambulatory Visit: Payer: Self-pay | Admitting: Neurology

## 2023-04-12 DIAGNOSIS — R208 Other disturbances of skin sensation: Secondary | ICD-10-CM

## 2023-04-12 DIAGNOSIS — H539 Unspecified visual disturbance: Secondary | ICD-10-CM

## 2023-04-12 DIAGNOSIS — R200 Anesthesia of skin: Secondary | ICD-10-CM

## 2023-04-12 DIAGNOSIS — M792 Neuralgia and neuritis, unspecified: Secondary | ICD-10-CM

## 2023-04-14 ENCOUNTER — Ambulatory Visit: Payer: BC Managed Care – PPO

## 2023-04-14 ENCOUNTER — Ambulatory Visit
Admission: RE | Admit: 2023-04-14 | Discharge: 2023-04-14 | Disposition: A | Discharge status: Home / Self Care | Payer: BC Managed Care – PPO | Source: Ambulatory Visit | Attending: Neurology | Admitting: Neurology

## 2023-04-14 ENCOUNTER — Ambulatory Visit: Admission: RE | Admit: 2023-04-14 | Payer: BC Managed Care – PPO | Source: Ambulatory Visit

## 2023-04-14 DIAGNOSIS — M792 Neuralgia and neuritis, unspecified: Secondary | ICD-10-CM | POA: Diagnosis present

## 2023-04-14 DIAGNOSIS — R208 Other disturbances of skin sensation: Secondary | ICD-10-CM | POA: Diagnosis present

## 2023-04-14 DIAGNOSIS — H539 Unspecified visual disturbance: Secondary | ICD-10-CM

## 2023-04-14 DIAGNOSIS — R200 Anesthesia of skin: Secondary | ICD-10-CM

## 2023-04-14 MED ORDER — GADOBUTROL 1 MMOL/ML IV SOLN
7.0000 mL | Freq: Once | INTRAVENOUS | Status: AC | PRN
  Administered 2023-04-14: 7 mL via INTRAVENOUS

## 2023-06-11 ENCOUNTER — Ambulatory Visit
Admission: RE | Admit: 2023-06-11 | Discharge: 2023-06-11 | Disposition: A | Discharge status: Home / Self Care | Payer: 59 | Source: Ambulatory Visit | Attending: Family Medicine

## 2023-06-11 VITALS — BP 117/75 | HR 116 | Temp 100.7°F | Resp 18

## 2023-06-11 DIAGNOSIS — J101 Influenza due to other identified influenza virus with other respiratory manifestations: Secondary | ICD-10-CM

## 2023-06-11 DIAGNOSIS — R6889 Other general symptoms and signs: Secondary | ICD-10-CM | POA: Diagnosis not present

## 2023-06-11 LAB — POCT INFLUENZA A/B
Influenza A, POC: POSITIVE — AB
Influenza B, POC: NEGATIVE

## 2023-06-11 MED ORDER — BENZONATATE 200 MG PO CAPS: 200.0000 mg | ORAL_CAPSULE | Freq: Three times a day (TID) | ORAL | 0 refills | Status: AC | PRN

## 2023-06-11 MED ORDER — OSELTAMIVIR PHOSPHATE 75 MG PO CAPS: 75.0000 mg | ORAL_CAPSULE | Freq: Two times a day (BID) | ORAL | 0 refills | Status: AC

## 2023-06-11 NOTE — ED Triage Notes (Signed)
Pt presents to UC for c/o cough, body aches, fever x2 days. 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

## 2023-06-11 NOTE — Discharge Instructions (Signed)
Start Tamiflu twice daily for 5 days.  You may take Tessalon as needed for cough.  Continue over-the-counter Tylenol ibuprofen as needed for fever management/body aches.  Lots of rest and fluids.  Please follow-up with your PCP if your symptoms do not improve.  Please go to the ER for any worsening symptoms.  I hope you feel better soon!

## 2023-06-11 NOTE — ED Provider Notes (Addendum)
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 Fever of 101, horrific aches and pains since last night. Productive cough - Entered by patient    HPI Tiffany Whitehead is a 33 y.o. female  presents for evaluation of URI symptoms for 2 days. Patient reports associated symptoms of cough, congestion, body aches, fever. Denies N/V/D, ear pain, sore throat, shortness of breath. Patient does not have a hx of asthma. Patient is not an active smoker.   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  Pt has taken cough send and Sudafed OTC for symptoms. Pt has no other concerns at this time.    Cough Associated symptoms: fever and myalgias     Past Medical History:  Diagnosis Date   Anxiety    Chronic kidney disease    stones and infection   Depression    H/O borderline personality disorder    stopped meds at beginning of pregnancy, Lamictal and Vyvanse   Hyperemesis affecting pregnancy, antepartum 09/23/2019   UTI (urinary tract infection)     Patient Active Problem List   Diagnosis Date Noted   Uterine contractions during pregnancy 01/26/2020   Non-reactive NST (non-stress test) 01/26/2020   Pelvic pain in pregnancy 01/06/2020   Encounter for suspected PROM, with rupture of membranes not found 12/10/2019   UTI (urinary tract infection) during pregnancy 11/21/2019   Encounter for supervision of other normal pregnancy, third trimester 07/10/2019   Anxiety 11/22/2017   GAD (generalized anxiety disorder) 07/04/2015   ADHD (attention deficit hyperactivity disorder), combined type 07/04/2015   Family history of colon cancer 07/04/2015   Lactose intolerance 07/04/2015   Transient gluten sensitivity 07/04/2015    Past Surgical History:  Procedure Laterality Date   APPENDECTOMY      COLONOSCOPY     extraction of wisdom teeth      OB History     Gravida  3   Para  2   Term  2   Preterm      AB  1   Living  2      SAB  1   IAB      Ectopic      Multiple  0   Live Births  2            Home Medications    Prior to Admission medications   Medication Sig Start Date End Date Taking? Authorizing Provider  benzonatate (TESSALON) 200 MG capsule Take 1 capsule (200 mg total) by mouth 3 (three) times daily as needed. 06/11/23  Yes Radford Pax, NP  oseltamivir (TAMIFLU) 75 MG capsule Take 1 capsule (75 mg total) by mouth every 12 (twelve) hours. 06/11/23  Yes Radford Pax, NP  amoxicillin-clavulanate (AUGMENTIN) 875-125 MG tablet Take 1 tablet by mouth 2 (two) times daily. 01/13/22   Bing Neighbors, NP  buPROPion (WELLBUTRIN XL) 300 MG 24 hr tablet Take 300 mg by mouth daily. 11/04/21   [provider]  clonazePAM (KLONOPIN) 0.5 MG tablet Take by mouth 2 (two) times daily as needed for anxiety.    [provider]  ferrous sulfate 325 (65 FE) MG tablet Take 1 tablet (325 mg total) by mouth daily. 01/28/20  [name], CNM  fluconazole (DIFLUCAN) 150 MG tablet Take 1 tablet (150 mg total) by mouth every three (3) days as needed. Repeat if needed 01/13/22   Bing Neighbors, NP  FLUoxetine (PROZAC) 20 MG capsule Take 20 mg by mouth daily. 08/07/21   [provider]  hydrOXYzine (ATARAX/VISTARIL) 25 MG tablet Take 25 mg by mouth daily. 12/20/19   [provider]  ibuprofen (ADVIL) 600 MG tablet Take 1 tablet (600 mg total) by mouth every 6 (six) hours as needed for mild pain, moderate pain or cramping. 01/28/20   [name], CNM  loratadine (CLARITIN) 10 MG tablet Take 10 mg by mouth daily.    [provider]  Prenatal Vit-Fe Fumarate-FA (MULTIVITAMIN-PRENATAL) 27-0.8 MG TABS tablet Take 1 tablet by mouth daily.    [provider]  sertraline (ZOLOFT) 100 MG tablet Take 200 mg by mouth at  bedtime.  09/04/19   [provider]    Family History Family History  Problem Relation Age of Onset   Cancer Mother    Cancer Other     Social History Social History   Tobacco Use   Smoking status: Never   Smokeless tobacco: Never  Vaping Use   Vaping status: Never Used  Substance Use Topics   Alcohol use: Not Currently    Comment: social   Drug use: No     Allergies   Ceclor [cefaclor] and Bee venom   Review of Systems Review of Systems  Constitutional:  Positive for fever.  HENT:  Positive for congestion.   Respiratory:  Positive for cough.   Musculoskeletal:  Positive for myalgias.     Physical Exam Triage Vital Signs ED Triage Vitals  Encounter Vitals Group     BP 06/11/23 1646 117/75     Systolic BP Percentile --      Diastolic BP Percentile --      Pulse Rate 06/11/23 1646 (!) 116     Resp 06/11/23 1646 18     Temp 06/11/23 1646 (!) 100.7 F (38.2 C)     Temp Source 06/11/23 1646 Oral     SpO2 06/11/23 1646 97 %     Weight --      Height --      Head Circumference --      Peak Flow --      Pain Score 06/11/23 1643 0     Pain Loc --      Pain Education --      Exclude from Growth Chart --    No data found.  Updated Vital Signs BP 117/75 (BP Location: Right Arm)   Pulse (!) 116   Temp (!) 100.7 F (38.2 C) (Oral)   Resp 18   LMP 05/23/2023 (Approximate)   SpO2 97%   Breastfeeding No   Visual Acuity Right Eye Distance:   Left Eye Distance:   Bilateral Distance:    Right Eye Near:   Left Eye Near:    Bilateral Near:     Physical Exam Vitals and nursing note reviewed.  Constitutional:      General: She is not in acute distress.    Appearance: She is well-developed. She is not ill-appearing.  HENT:     Head: Normocephalic and atraumatic.     Right Ear: Tympanic membrane and ear canal normal.     Left Ear: Tympanic membrane and ear canal normal.     Nose: Congestion present.     Mouth/Throat:  Mouth: Mucous  membranes are moist.     Pharynx: Oropharynx is clear. Uvula midline. No oropharyngeal exudate or posterior oropharyngeal erythema.     Tonsils: No tonsillar exudate or tonsillar abscesses.  Eyes:     Conjunctiva/sclera: Conjunctivae normal.     Pupils: Pupils are equal, round, and reactive to light.  Cardiovascular:     Rate and Rhythm: Regular rhythm. Tachycardia present.     Heart sounds: Normal heart sounds.  Pulmonary:     Effort: Pulmonary effort is normal.     Breath sounds: Normal breath sounds.  Musculoskeletal:     Cervical back: Normal range of motion and neck supple.  Lymphadenopathy:     Cervical: No cervical adenopathy.  Skin:    General: Skin is warm and dry.  Neurological:     General: No focal deficit present.     Mental Status: She is alert and oriented to person, place, and time.  Psychiatric:        Mood and Affect: Mood normal.        Behavior: Behavior normal.      UC Treatments / Results  Labs (all labs ordered are listed, but only abnormal results are displayed) Labs Reviewed  POCT INFLUENZA A/B - Abnormal; Notable for the following components:      Result Value   Influenza A, POC Positive (*)    All other components within normal limits    EKG   Radiology No results found.  Procedures Procedures (including critical care time)  Medications Ordered in UC Medications - No data to display  Initial Impression / Assessment and Plan / UC Course  I have reviewed the triage vital signs and the nursing notes.  Pertinent labs & imaging results that were available during my care of the patient were reviewed by me and considered in my medical decision making (see chart for details).     Reviewed exam and sx with patient. No red flags. Discussed sx consistent with flu, however, pt wanted testing to confirm.  Positive rapid flu.  Start Tamiflu and Tessalon.  PCP follow-up if symptoms do not improve.  ER precautions reviewed. Final Clinical  Impressions(s) / UC Diagnoses   Final diagnoses:  Flu-like symptoms  Influenza A     Discharge Instructions      Start Tamiflu twice daily for 5 days.  You may take Tessalon as needed for cough.  Continue over-the-counter Tylenol ibuprofen as needed for fever management/body aches.  Lots of rest and fluids.  Please follow-up with your PCP if your symptoms do not improve.  Please go to the ER for any worsening symptoms.  I hope you feel better soon!     ED Prescriptions     Medication Sig Dispense Auth. Provider   oseltamivir (TAMIFLU) 75 MG capsule Take 1 capsule (75 mg total) by mouth every 12 (twelve) hours. 10 capsule Radford Pax, NP   benzonatate (TESSALON) 200 MG capsule Take 1 capsule (200 mg total) by mouth 3 (three) times daily as needed. 20 capsule Radford Pax, NP      PDMP not reviewed this encounter.   Radford Pax, NP 06/11/23 1713    Radford Pax, NP 06/11/23 1714

## 2023-06-12 ENCOUNTER — Ambulatory Visit: Payer: Self-pay

## 2023-06-14 ENCOUNTER — Telehealth: Payer: Self-pay

## 2023-06-14 NOTE — Telephone Encounter (Signed)
Copied from CRM [id number]. Topic: Appointments - Appointment Cancel/Reschedule >> Jun 11, 2023  4:35 PM Alona Bene A wrote: Patient called in to cancel appointment scheduled for tomorrow 06/12/23 at 12pm. Please cancel appointment for patient as she was able to get a sooner appointment.  Patient was not scheduled at [facility]

## 2023-07-02 ENCOUNTER — Ambulatory Visit
Admission: RE | Admit: 2023-07-02 | Discharge: 2023-07-02 | Disposition: A | Discharge status: Home / Self Care | Payer: 59 | Source: Ambulatory Visit | Attending: Emergency Medicine | Admitting: Emergency Medicine

## 2023-07-02 VITALS — BP 119/87 | HR 86 | Temp 98.6°F | Resp 20

## 2023-07-02 DIAGNOSIS — R051 Acute cough: Secondary | ICD-10-CM

## 2023-07-02 DIAGNOSIS — J988 Other specified respiratory disorders: Secondary | ICD-10-CM

## 2023-07-02 MED ORDER — DOXYCYCLINE HYCLATE 100 MG PO CAPS: 100.0000 mg | ORAL_CAPSULE | Freq: Two times a day (BID) | ORAL | 0 refills | Status: AC

## 2023-07-02 NOTE — ED Provider Notes (Signed)
Renaldo Fiddler    CSN: [id number] Arrival date & time: 07/02/23  1033      History   Chief Complaint Chief Complaint  Patient presents with   Cough    HPI Tiffany Whitehead is a 33 y.o. female.   33 year old female, Tiffany Whitehead, presents to urgent care for evaluation of persistent cough she has had for 3 weeks.  Patient has been seen at several urgent cares and treated for influenza A as well as bronchitis.  Patient states she was sent to be evaluated from school by school nurse for her barking cough.  Last took 650 mg of Tylenol at 930 this morning.  Vital signs are stable in office patient is afebrile with 100%O2 sats on RA, pulse 86,NAD  Denies past medical history of: Anxiety ,  depression, borderline personality disorder, UTI, chronic kidney disease  The history is provided by the patient. No language interpreter was used.    Past Medical History:  Diagnosis Date   Anxiety    Chronic kidney disease    stones and infection   Depression    H/O borderline personality disorder    stopped meds at beginning of pregnancy, Lamictal and Vyvanse   Hyperemesis affecting pregnancy, antepartum 09/23/2019   UTI (urinary tract infection)     Patient Active Problem List   Diagnosis Date Noted   Respiratory infection 07/02/2023   Acute cough 07/02/2023   Uterine contractions during pregnancy 01/26/2020   Non-reactive NST (non-stress test) 01/26/2020   Pelvic pain in pregnancy 01/06/2020   Encounter for suspected PROM, with rupture of membranes not found 12/10/2019   UTI (urinary tract infection) during pregnancy 11/21/2019   Encounter for supervision of other normal pregnancy, third trimester 07/10/2019   Anxiety 11/22/2017   GAD (generalized anxiety disorder) 07/04/2015   ADHD (attention deficit hyperactivity disorder), combined type 07/04/2015   Family history of colon cancer 07/04/2015   Lactose intolerance 07/04/2015   Transient gluten sensitivity 07/04/2015    Past  Surgical History:  Procedure Laterality Date   APPENDECTOMY     COLONOSCOPY     extraction of wisdom teeth      OB History     Gravida  3   Para  2   Term  2   Preterm      AB  1   Living  2      SAB  1   IAB      Ectopic      Multiple  0   Live Births  2            Home Medications    Prior to Admission medications   Medication Sig Start Date End Date Taking? Authorizing Provider  doxycycline (VIBRAMYCIN) 100 MG capsule Take 1 capsule (100 mg total) by mouth 2 (two) times daily for 7 days. 07/02/23 07/09/23 Yes Alexi Dorminey, Para March, NP  benzonatate (TESSALON) 200 MG capsule Take 1 capsule (200 mg total) by mouth 3 (three) times daily as needed. 06/11/23   Radford Pax, NP  buPROPion (WELLBUTRIN XL) 300 MG 24 hr tablet Take 300 mg by mouth daily. 11/04/21   [provider]  clonazePAM (KLONOPIN) 0.5 MG tablet Take by mouth 2 (two) times daily as needed for anxiety.    [provider]  ferrous sulfate 325 (65 FE) MG tablet Take 1 tablet (325 mg total) by mouth daily. 01/28/20   [name], CNM  fluconazole (DIFLUCAN) 150 MG tablet Take 1 tablet (150  mg total) by mouth every three (3) days as needed. Repeat if needed 01/13/22   Bing Neighbors, NP  FLUoxetine (PROZAC) 20 MG capsule Take 20 mg by mouth daily. 08/07/21   [provider]  hydrOXYzine (ATARAX/VISTARIL) 25 MG tablet Take 25 mg by mouth daily. 12/20/19   [provider]  ibuprofen (ADVIL) 600 MG tablet Take 1 tablet (600 mg total) by mouth every 6 (six) hours as needed for mild pain, moderate pain or cramping. 01/28/20   [name], CNM  loratadine (CLARITIN) 10 MG tablet Take 10 mg by mouth daily.    [provider]  oseltamivir (TAMIFLU) 75 MG capsule Take 1 capsule (75 mg total) by mouth every 12 (twelve) hours. 06/11/23   Radford Pax, NP  Prenatal Vit-Fe Fumarate-FA (MULTIVITAMIN-PRENATAL) 27-0.8 MG TABS tablet Take 1 tablet by mouth daily.     [provider]  sertraline (ZOLOFT) 100 MG tablet Take 200 mg by mouth at bedtime.  09/04/19   [provider]    Family History Family History  Problem Relation Age of Onset   Cancer Mother    Cancer Other     Social History Social History   Tobacco Use   Smoking status: Never   Smokeless tobacco: Never  Vaping Use   Vaping status: Never Used  Substance Use Topics   Alcohol use: Not Currently    Comment: social   Drug use: No     Allergies   Ceclor [cefaclor] and Bee venom   Review of Systems Review of Systems  Constitutional:  Negative for fever.  Respiratory:  Positive for cough. Negative for shortness of breath, wheezing and stridor.   All other systems reviewed and are negative.    Physical Exam Triage Vital Signs ED Triage Vitals  Encounter Vitals Group     BP 07/02/23 1052 119/87     Systolic BP Percentile --      Diastolic BP Percentile --      Pulse Rate 07/02/23 1052 86     Resp 07/02/23 1052 20     Temp 07/02/23 1052 98.6 F (37 C)     Temp Source 07/02/23 1052 Oral     SpO2 07/02/23 1052 100 %     Weight --      Height --      Head Circumference --      Peak Flow --      Pain Score 07/02/23 1051 8     Pain Loc --      Pain Education --      Exclude from Growth Chart --    No data found.  Updated Vital Signs BP 119/87 (BP Location: Left Arm)   Pulse 86   Temp 98.6 F (37 C) (Oral)   Resp 20   LMP 05/23/2023 (Approximate)   SpO2 100%   Visual Acuity Right Eye Distance:   Left Eye Distance:   Bilateral Distance:    Right Eye Near:   Left Eye Near:    Bilateral Near:     Physical Exam Vitals and nursing note reviewed.  Constitutional:      General: She is not in acute distress.    Appearance: She is well-developed.  HENT:     Head: Normocephalic.     Right Ear: Tympanic membrane is retracted.     Left Ear: Tympanic membrane is retracted.     Nose: Mucosal edema and congestion present.     Right Sinus:  Maxillary sinus tenderness  and frontal sinus tenderness present.     Left Sinus: Maxillary sinus tenderness and frontal sinus tenderness present.     Mouth/Throat:     Lips: Pink.     Mouth: Mucous membranes are moist.     Pharynx: Oropharynx is clear.  Eyes:     General: Lids are normal.     Conjunctiva/sclera: Conjunctivae normal.     Pupils: Pupils are equal, round, and reactive to light.  Neck:     Trachea: No tracheal deviation.  Cardiovascular:     Rate and Rhythm: Normal rate and regular rhythm.     Pulses: Normal pulses.     Heart sounds: Normal heart sounds. No murmur heard. Pulmonary:     Effort: Pulmonary effort is normal.     Breath sounds: Normal breath sounds and air entry.  Abdominal:     General: Bowel sounds are normal.     Palpations: Abdomen is soft.     Tenderness: There is no abdominal tenderness.  Musculoskeletal:        General: Normal range of motion.     Cervical back: Normal range of motion.  Lymphadenopathy:     Cervical: No cervical adenopathy.  Skin:    General: Skin is warm and dry.     Findings: No rash.  Neurological:     General: No focal deficit present.     Mental Status: She is alert and oriented to person, place, and time.     GCS: GCS eye subscore is 4. GCS verbal subscore is 5. GCS motor subscore is 6.  Psychiatric:        Attention and Perception: Attention normal.        Mood and Affect: Mood normal.        Speech: Speech normal.        Behavior: Behavior normal. Behavior is cooperative.      UC Treatments / Results  Labs (all labs ordered are listed, but only abnormal results are displayed) Labs Reviewed - No data to display  EKG   Radiology No results found.  Procedures Procedures (including critical care time)  Medications Ordered in UC Medications - No data to display  Initial Impression / Assessment and Plan / UC Course  I have reviewed the triage vital signs and the nursing notes.  Pertinent labs & imaging  results that were available during my care of the patient were reviewed by me and considered in my medical decision making (see chart for details).    Discussed exam findings and plan of care with patient, no imaging is available at this site today, will treat patient with doxycycline that will cover for maxillary sinusitis as well as pneumonia.  Strict go to ER precautions given, patient verbalized understanding to this provider.  Ddx: Respiratory infection, sinusitis, acute cough, viral illness, allergies Final Clinical Impressions(s) / UC Diagnoses   Final diagnoses:  Respiratory infection  Acute cough     Discharge Instructions      Rest,push fluids, May try using over the counter throat lozenges, mucinex or sudafed or allergy med of choice as label directed, hot tea, honey for cough.   Take doxycycline (antibiotic) as label directed. Any antibiotic may cause upset stomach, resistance, photosensitivity  If you have worsening symptoms: Shortness of breath, palpitations, chest pain, go to the ER for further evaluation.        ED Prescriptions     Medication Sig Dispense Auth. Provider   doxycycline (VIBRAMYCIN) 100 MG capsule Take 1  capsule (100 mg total) by mouth 2 (two) times daily for 7 days. 14 capsule Ellyana Crigler, Para March, NP      PDMP not reviewed this encounter.   Clancy Gourd, NP 07/02/23 1137

## 2023-07-02 NOTE — ED Triage Notes (Signed)
Pt c/o coughing and wheezing x3week  Pt has Flu A with temp of 104.5  Pt has had pink phlegm with cough and was given nasal spray and cough syrup.  Pt was told she has RSV and cough is continued and worsened.  Pt has taken a home oxygen and it was 96%  Pt is worried about bronchitis and pneumonia.   Pt has OTC 600mg  tylenol at 9:30

## 2023-07-02 NOTE — Discharge Instructions (Addendum)
Rest,push fluids, May try using over the counter throat lozenges, mucinex or sudafed or allergy med of choice as label directed, hot tea, honey for cough.   Take doxycycline (antibiotic) as label directed. Any antibiotic may cause upset stomach, resistance, photosensitivity  If you have worsening symptoms: Shortness of breath, palpitations, chest pain, go to the ER for further evaluation.

## 2024-03-02 ENCOUNTER — Ambulatory Visit
Admission: RE | Admit: 2024-03-02 | Discharge: 2024-03-02 | Disposition: A | Discharge status: Home / Self Care | Payer: Self-pay | Source: Ambulatory Visit | Attending: Emergency Medicine | Admitting: Emergency Medicine

## 2024-03-02 VITALS — BP 112/76 | HR 75 | Temp 98.4°F | Resp 16

## 2024-03-02 DIAGNOSIS — S41102A Unspecified open wound of left upper arm, initial encounter: Secondary | ICD-10-CM | POA: Diagnosis not present

## 2024-03-02 MED ORDER — DOXYCYCLINE HYCLATE 100 MG PO CAPS: 100.0000 mg | ORAL_CAPSULE | Freq: Two times a day (BID) | ORAL | 0 refills | Status: AC

## 2024-03-02 NOTE — Discharge Instructions (Signed)
 Today you are evaluated for your wound which is concerning for infection as the skin is hot to touch, and there is redness swelling and pain  Take doxycycline  twice daily for 7 days to treat bacteria contributing to symptoms  May cover with a Band-Aid or leave open to air for preference, please cover if you start to notice puslike drainage which is common after antibiotic starts  Cleanse over the area once daily during normal hygiene with unscented soap and water, pat and do not rub  If the area does not improve please follow-up for reevaluation

## 2024-03-02 NOTE — ED Provider Notes (Signed)
 Tiffany Whitehead    CSN: [id number] Arrival date & time: 03/02/24  0904      History   Chief Complaint Chief Complaint  Patient presents with   Abscess    Entered by patient    HPI Tiffany Whitehead is a 33 y.o. female.   Patient presents for evaluation of a bump underneath the left arm first noticed 4 days ago.  Only worsen as area has become red and painful.  Unsure of presence of drainage, endorses it has become blisterlike twice and then resolved on its own.  Has applied Vaseline and covered with a nonadherent dressing.  Works as a [employer] loss adjuster and was advised in person evaluation by school nurse who had concerns for MRSA.  Past Medical History:  Diagnosis Date   Anxiety    Chronic kidney disease    stones and infection   Depression    H/O borderline personality disorder    stopped meds at beginning of pregnancy, Lamictal and Vyvanse   Hyperemesis affecting pregnancy, antepartum 09/23/2019   UTI (urinary tract infection)     Patient Active Problem List   Diagnosis Date Noted   Respiratory infection 07/02/2023   Acute cough 07/02/2023   Uterine contractions during pregnancy 01/26/2020   Non-reactive NST (non-stress test) 01/26/2020   Pelvic pain in pregnancy 01/06/2020   Encounter for suspected PROM, with rupture of membranes not found 12/10/2019   UTI (urinary tract infection) during pregnancy 11/21/2019   Encounter for supervision of other normal pregnancy, third trimester 07/10/2019   Anxiety 11/22/2017   GAD (generalized anxiety disorder) 07/04/2015   ADHD (attention deficit hyperactivity disorder), combined type 07/04/2015   Family history of colon cancer 07/04/2015   Lactose intolerance 07/04/2015   Transient gluten sensitivity 07/04/2015    Past Surgical History:  Procedure Laterality Date   APPENDECTOMY     COLONOSCOPY     extraction of wisdom teeth      OB History     Gravida  3   Para  2   Term  2   Preterm      AB  1   Living  2       SAB  1   IAB      Ectopic      Multiple  0   Live Births  2            Home Medications    Prior to Admission medications   Medication Sig Start Date End Date Taking? Authorizing Provider  doxycycline  (VIBRAMYCIN ) 100 MG capsule Take 1 capsule (100 mg total) by mouth 2 (two) times daily. 03/02/24  Yes Clance Baquero R, NP  benzonatate  (TESSALON ) 200 MG capsule Take 1 capsule (200 mg total) by mouth 3 (three) times daily as needed. 06/11/23   Mayer, Jodi R, NP  buPROPion (WELLBUTRIN XL) 300 MG 24 hr tablet Take 300 mg by mouth daily. 11/04/21   [provider]  clonazePAM  (KLONOPIN ) 0.5 MG tablet Take by mouth 2 (two) times daily as needed for anxiety.    [provider]  ferrous sulfate  325 (65 FE) MG tablet Take 1 tablet (325 mg total) by mouth daily. 01/28/20   Dean Quant, CNM  fluconazole  (DIFLUCAN ) 150 MG tablet Take 1 tablet (150 mg total) by mouth every three (3) days as needed. Repeat if needed 01/13/22   Arloa Suzen RAMAN, NP  FLUoxetine (PROZAC) 20 MG capsule Take 20 mg by mouth daily. 08/07/21   [provider]  hydrOXYzine  (  ATARAX /VISTARIL ) 25 MG tablet Take 25 mg by mouth daily. 12/20/19   [provider]  ibuprofen  (ADVIL ) 600 MG tablet Take 1 tablet (600 mg total) by mouth every 6 (six) hours as needed for mild pain, moderate pain or cramping. 01/28/20   Dean Quant, CNM  loratadine (CLARITIN) 10 MG tablet Take 10 mg by mouth daily.    [provider]  oseltamivir  (TAMIFLU ) 75 MG capsule Take 1 capsule (75 mg total) by mouth every 12 (twelve) hours. 06/11/23   Mayer, Jodi R, NP  Prenatal Vit-Fe Fumarate-FA (MULTIVITAMIN-PRENATAL) 27-0.8 MG TABS tablet Take 1 tablet by mouth daily.    [provider]  sertraline  (ZOLOFT ) 100 MG tablet Take 200 mg by mouth at bedtime.  09/04/19   [provider]    Family History Family History  Problem Relation Age of Onset   Cancer Mother    Cancer Other      Social History Social History   Tobacco Use   Smoking status: Never   Smokeless tobacco: Never  Vaping Use   Vaping status: Never Used  Substance Use Topics   Alcohol use: Not Currently    Comment: social   Drug use: No     Allergies   Ceclor [cefaclor] and Bee venom   Review of Systems Review of Systems   Physical Exam Triage Vital Signs ED Triage Vitals  Encounter Vitals Group     BP 03/02/24 0927 112/76     Girls Systolic BP Percentile --      Girls Diastolic BP Percentile --      Boys Systolic BP Percentile --      Boys Diastolic BP Percentile --      Pulse Rate 03/02/24 0927 75     Resp 03/02/24 0927 16     Temp 03/02/24 0927 98.4 F (36.9 C)     Temp Source 03/02/24 0927 Oral     SpO2 03/02/24 0927 98 %     Weight --      Height --      Head Circumference --      Peak Flow --      Pain Score 03/02/24 0929 7     Pain Loc --      Pain Education --      Exclude from Growth Chart --    No data found.  Updated Vital Signs BP 112/76 (BP Location: Right Arm)   Pulse 75   Temp 98.4 F (36.9 C) (Oral)   Resp 16   LMP 02/21/2024 (Exact Date)   SpO2 98%   Visual Acuity Right Eye Distance:   Left Eye Distance:   Bilateral Distance:    Right Eye Near:   Left Eye Near:    Bilateral Near:     Physical Exam Constitutional:      Appearance: Normal appearance.  Eyes:     Extraocular Movements: Extraocular movements intact.  Pulmonary:     Effort: Pulmonary effort is normal.  Skin:    Comments: 1 x 2) to the posterior of the left upper arm, surrounding erythema, skin hot to touch, tender to palpation, no drainage  Neurological:     Mental Status: She is alert and oriented to person, place, and time. Mental status is at baseline.      UC Treatments / Results  Labs (all labs ordered are listed, but only abnormal results are displayed) Labs Reviewed - No data to display  EKG   Radiology No results found.  Procedures  Procedures  (including critical care time)  Medications Ordered in UC Medications - No data to display  Initial Impression / Assessment and Plan / UC Course  I have reviewed the triage vital signs and the nursing notes.  Pertinent labs & imaging results that were available during my care of the patient were reviewed by me and considered in my medical decision making (see chart for details).  Open wound of left upper arm, initial encounter  Presentation of the wound concerning for infection, stable for outpatient management, no signs of sepsis prescribed doxycycline  recommended daily cleansing with soap and water and covering with a nonadherent dressing per preference, advised follow-up for nonhealing site Final Clinical Impressions(s) / UC Diagnoses   Final diagnoses:  Open wound of left upper arm, initial encounter     Discharge Instructions      Today you are evaluated for your wound which is concerning for infection as the skin is hot to touch, and there is redness swelling and pain  Take doxycycline  twice daily for 7 days to treat bacteria contributing to symptoms  May cover with a Band-Aid or leave open to air for preference, please cover if you start to notice puslike drainage which is common after antibiotic starts  Cleanse over the area once daily during normal hygiene with unscented soap and water, pat and do not rub  If the area does not improve please follow-up for reevaluation    ED Prescriptions     Medication Sig Dispense Auth. Provider   doxycycline  (VIBRAMYCIN ) 100 MG capsule Take 1 capsule (100 mg total) by mouth 2 (two) times daily. 14 capsule Ottie Tillery, Shelba SAUNDERS, NP      PDMP not reviewed this encounter.   Teresa Shelba SAUNDERS, [place] 03/02/24 [phone]

## 2024-03-02 NOTE — ED Triage Notes (Signed)
 Patient states that she had a blister on left upper arm on Sunday morning at 2 am. Patient states that blister started draining. Patient is now concerned about infection. Patient was using Vaseline and kept it cover. Rates  pain 7/10.
